# Patient Record
Sex: Female | Born: 1950 | Race: White | Hispanic: No | Smoking: Never smoker
Health system: Southern US, Community
[De-identification: ages and names within clinical notes are randomized; demographics above are authoritative.]

## PROBLEM LIST (undated history)

## (undated) DIAGNOSIS — G20 Parkinson's disease: Secondary | ICD-10-CM

## (undated) DIAGNOSIS — G4730 Sleep apnea, unspecified: Secondary | ICD-10-CM

## (undated) DIAGNOSIS — J42 Unspecified chronic bronchitis: Secondary | ICD-10-CM

## (undated) DIAGNOSIS — I82409 Acute embolism and thrombosis of unspecified deep veins of unspecified lower extremity: Secondary | ICD-10-CM

## (undated) DIAGNOSIS — M48061 Spinal stenosis, lumbar region without neurogenic claudication: Secondary | ICD-10-CM

## (undated) DIAGNOSIS — J189 Pneumonia, unspecified organism: Secondary | ICD-10-CM

## (undated) DIAGNOSIS — G20A1 Parkinson's disease without dyskinesia, without mention of fluctuations: Secondary | ICD-10-CM

## (undated) DIAGNOSIS — I4891 Unspecified atrial fibrillation: Secondary | ICD-10-CM

## (undated) DIAGNOSIS — F32A Depression, unspecified: Secondary | ICD-10-CM

## (undated) DIAGNOSIS — F419 Anxiety disorder, unspecified: Secondary | ICD-10-CM

## (undated) DIAGNOSIS — F329 Major depressive disorder, single episode, unspecified: Secondary | ICD-10-CM

## (undated) DIAGNOSIS — J45909 Unspecified asthma, uncomplicated: Secondary | ICD-10-CM

## (undated) HISTORY — PX: ABDOMINAL HYSTERECTOMY: SHX81

## (undated) HISTORY — PX: KNEE ARTHROSCOPY: SHX127

## (undated) HISTORY — PX: BACK SURGERY: SHX140

## (undated) HISTORY — PX: GENIOPLASTY: SUR643

## (undated) NOTE — ED Provider Notes (Signed)
 Formatting of this note is different from the original. EMERGENCY DEPARTMENT HISTORY AND PHYSICAL EXAM  Date: 09/03/2018 Patient Name: Sharon Jenkins  History of Presenting Illness   Chief Complaint  Patient presents with  ? Altered mental status   History Provided By: EMS  Additional History (Context):   3:19 AM  Sharon Jenkins is a 41 y.o. female with pertinent PMHx of Parkinson's disease presenting to the ED via EMS due to AMS x tonight. Pt lives at [facility] and her caretaker noticed that she was very stiff around 2:00 AM. She was given a dose of new medication (Apokyn) for her Parkinson's around 2:15 AM but her mental status deteriorated. Her caretakers noted that she had an episode of clear emesis with mucous earlier in the night. They also report that the pt is normally A&Ox3. Per EMS, on their arrival pt was not responding to commands and had a very stiff neck and jaw but would track them with her eyes. Her BGL was 125.  PCP: Other, Phys, MD  There are no other complaints, changes, or physical findings at this time.   Past History   Past Medical History: Past Medical History:  Diagnosis Date  ? A-fib (HCC)   ? Anxiety   ? Depression   ? Gait disorder   ? Kyphosis   ? Obstructive sleep apnea   ? Parkinson's disease (HCC)   ? Protein malnutrition (HCC)   ? REM behavioral disorder   ? Sialorrhea   ? Spinal stenosis   ? Tinnitus   ? Tremors of nervous system   ? Urinary incontinence    Past Surgical History: No past surgical history on file.  Family History: No family history on file.  Social History: Social History   Tobacco Use  ? Smoking status: Not on file  Substance Use Topics  ? Alcohol  use: Not on file  ? Drug use: Not on file   Allergies: No Known Allergies  Review of Systems  Review of Systems  Unable to perform ROS: Mental status change   Physical Exam   Vitals:   09/03/18 0323 09/03/18 0325 09/03/18 0400 09/03/18 0430  BP: 132/71   133/68 140/79  Pulse: 77  69 69  Resp: 17  22 23   Temp: 97.4 F (36.3 C)     SpO2: 100%  100% 100%  Weight:  64.7 kg (142 lb 11.2 oz)     Physical Exam Vitals signs and nursing note reviewed.  Constitutional:      Appearance: She is well-developed. She is not diaphoretic.     Comments: Near catatonic state. Non verbal. Mouth is held open.  HENT:     Head: Normocephalic and atraumatic.     Right Ear: External ear normal.     Left Ear: External ear normal.     Mouth/Throat:     Pharynx: No oropharyngeal exudate.  Eyes:     General: No scleral icterus.    Conjunctiva/sclera: Conjunctivae normal.     Pupils: Pupils are equal, round, and reactive to light.  Neck:     Musculoskeletal: Neck rigidity present.     Thyroid : No thyromegaly.     Vascular: No JVD.     Trachea: No tracheal deviation.  Cardiovascular:     Rate and Rhythm: Normal rate and regular rhythm.     Heart sounds: Normal heart sounds.  Pulmonary:     Effort: Pulmonary effort is normal. No respiratory distress.     Breath sounds: Normal  breath sounds. No stridor.  Abdominal:     General: Bowel sounds are normal. There is no distension.     Palpations: Abdomen is soft.     Tenderness: There is no abdominal tenderness. There is no guarding or rebound.  Musculoskeletal:        General: No tenderness.     Comments: Markedly slower muscle activity.  Lymphadenopathy:     Cervical: No cervical adenopathy.  Skin:    General: Skin is warm and dry.     Findings: No erythema or rash.  Neurological:     Cranial Nerves: No cranial nerve deficit.     Deep Tendon Reflexes: Reflexes are normal and symmetric.     Comments: Near catatonic state. Nonverbal.   Psychiatric:        Thought Content: Thought content normal.        Cognition and Memory: Cognition is impaired.        Judgment: Judgment normal.   Diagnostic Study Results   Labs -  Recent Results (from the past 12 hour(s))  CBC WITH AUTOMATED DIFF    Collection Time: 09/03/18  3:28 AM  Result Value Ref Range   WBC 11.1 4.6 - 13.2 K/uL   RBC 4.10 (L) 4.20 - 5.30 M/uL   HGB 12.5 12.0 - 16.0 g/dL   HCT 61.5 64.9 - 54.9 %   MCV 93.7 74.0 - 97.0 FL   MCH 30.5 24.0 - 34.0 PG   MCHC 32.6 31.0 - 37.0 g/dL   RDW 86.8 88.3 - 85.4 %   PLATELET 252 135 - 420 K/uL   MPV 9.2 9.2 - 11.8 FL   NEUTROPHILS 71 40 - 73 %   LYMPHOCYTES 17 (L) 21 - 52 %   MONOCYTES 7 3 - 10 %   EOSINOPHILS 5 0 - 5 %   BASOPHILS 0 0 - 2 %   ABS. NEUTROPHILS 8.0 1.8 - 8.0 K/UL   ABS. LYMPHOCYTES 1.8 0.9 - 3.6 K/UL   ABS. MONOCYTES 0.7 0.05 - 1.2 K/UL   ABS. EOSINOPHILS 0.6 (H) 0.0 - 0.4 K/UL   ABS. BASOPHILS 0.0 0.0 - 0.1 K/UL   DF AUTOMATED    METABOLIC PANEL, BASIC   Collection Time: 09/03/18  3:28 AM  Result Value Ref Range   Sodium 140 136 - 145 mmol/L   Potassium 3.8 3.5 - 5.5 mmol/L   Chloride 106 100 - 111 mmol/L   CO2 30 21 - 32 mmol/L   Anion gap 4 3.0 - 18 mmol/L   Glucose 102 (H) 74 - 99 mg/dL   BUN 17 7.0 - 18 MG/DL   Creatinine 9.18 0.6 - 1.3 MG/DL   BUN/Creatinine ratio 21 (H) 12 - 20     GFR est AA >60 >60 ml/min/1.2m2   GFR est non-AA >60 >60 ml/min/1.61m2   Calcium  9.5 8.5 - 10.1 MG/DL  MAGNESIUM   Collection Time: 09/03/18  3:28 AM  Result Value Ref Range   Magnesium 2.3 1.6 - 2.6 mg/dL  CK   Collection Time: 09/03/18  3:28 AM  Result Value Ref Range   CK 119 26 - 192 U/L   Radiologic Studies -  No orders to display   CT Results  (Last 48 hours)   None    CXR Results  (Last 48 hours)   None    Medical Decision Making  I am the first provider for this patient.  I reviewed the vital signs, available nursing notes, past medical history, past  surgical history, family history and social history.  Vital Signs-Reviewed the patient's vital signs.  Pulse Oximetry Analysis - 100% on RA   Cardiac Monitor: Rate: 70 bpm Rhythm: NSR  Records Reviewed: Nursing Notes and Old Medical Records  Provider Notes (Medical Decision  Making): Looks to be medication side effect. Spoke with neurologist, no interventions required. Will let medications wear off.  PROCEDURES: Procedures  MEDICATIONS GIVEN IN THE ED: Medications - No data to display   ED COURSE:  3:19 AM  Initial assessment performed.  CONSULT NOTE:  4:04 AM Reyes BIRCH. Eliza, MD spoke with Roel Purl, MD  Specialty: Neurology Discussed pt's hx, disposition, and available diagnostic and imaging results  over the telephone. Reviewed care plans. Consulting physician agrees with plans as outlined.   Diagnosis and Disposition   DISCHARGE NOTE: 4:56 AM The patient is ready for discharge. The patient's signs, symptoms, diagnosis, and discharge instructions have been discussed and the patient and/or family has conveyed their understanding. The patient and/or family is to follow up as recommended or return to the ER should their symptoms worsen. Plan has been discussed and the patient and/or family is in agreement.  Written by Chiquita Canary, ED Scribe, as dictated by Reyes CORDOBA Eliza, MD.   CLINICAL IMPRESSION: 1. Catatonia   2. Parkinson's disease (HCC)    PLAN: 1. D/C Home 2.  Current Discharge Medication List    3.  Follow-up Information   None   _______________________________  Attestations: This note is prepared by Chiquita Canary, acting as Scribe for [employer]. Eliza, MD.  Reyes BIRCH. Eliza, MD:  The scribe's documentation has been prepared under my direction and personally reviewed by me in its entirety. I confirm that the note above accurately reflects all work, treatment, procedures, and medical decision making performed by me. _______________________________    Electronically signed by Eliza Reyes BIRCH, MD at 09/13/2018  9:26 PM EDT

## (undated) NOTE — ED Notes (Signed)
 Formatting of this note might be different from the original. Pt was transferred back to home facility [facility]. She is stable, moving on her own and conversing and in NAD. Transported via stretcher with [facility] transport with medics at her side. She is in NAD and [facility] is expecting her arrival .  Electronically signed by Celestino Wynema HERO, RN at 09/03/2018  7:22 AM EDT

## (undated) NOTE — ED Triage Notes (Signed)
 Formatting of this note might be different from the original. Per updated report of staff pt began to experience increased stiffness at 2 am this morning. She was responsive, but unable to follow commands at hat time. She was given Apoxyn at that time PRN to help her muscles relax. However, the med was not effective as it usually is and pt remained stiff, unable to speak and there for was transferred to the ED for evaluation. Pt did have an episode of clear mucoid emesis last night at [facility].  Electronically signed by Celestino Wynema HERO, RN at 09/03/2018  3:37 AM EDT

## (undated) NOTE — ED Notes (Signed)
 Formatting of this note might be different from the original. BIBA from facility. Pt with hx of parkinson's disease. Received a new medication tonight.Now stiff and with limited movement on her own or speak. Pt does track with her eyes.  Unable to complete full screens as pt is unable to answer.  Electronically signed by Celestino Wynema HERO, RN at 09/03/2018  3:44 AM EDT

## (undated) NOTE — ED Notes (Signed)
 Formatting of this note might be different from the original. Warm blankets given for comfort. Pt opens eyes with speech, able to speak with RN. Voice is quiet, but coherent. Pt is OX4, answers that she is feeling better.  Electronically signed by Celestino Wynema HERO, RN at 09/03/2018  4:38 AM EDT

---

## 1972-06-17 HISTORY — PX: DILATION AND CURETTAGE OF UTERUS: SHX78

## 1978-05-17 HISTORY — PX: TUBAL LIGATION: SHX77

## 1985-06-17 HISTORY — PX: VARICOSE VEIN SURGERY: SHX832

## 1989-06-17 HISTORY — PX: LUMBAR DISC ARTHROPLASTY: SHX699

## 1996-06-17 HISTORY — PX: CATARACT EXTRACTION W/ INTRAOCULAR LENS  IMPLANT, BILATERAL: SHX1307

## 2000-03-15 ENCOUNTER — Emergency Department (HOSPITAL_COMMUNITY): Admission: EM | Admit: 2000-03-15 | Discharge: 2000-03-15 | Payer: Self-pay | Admitting: Family Medicine
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
## 2003-05-11 NOTE — Procedures (Signed)
[facility]                         PERIPHERAL VASCULAR LABORATORY   NAME:     Lindsay [name], Lindsay [name]                    DATE:   05/11/2003   AGE/DOB: 52  /  07/31/1950                      ROOM #: OP   SEX:      F                                 MR #:    [id number]   CPT CODE: 93975                            SS#     [id number]   REFERRING PHYSICIAN:   FELIX P. TIONGCO, M.D.   CHIEF COMPLAINT/SYMPTOMS:  Ischemic colitis   EXAMINATION:  SMA/CELIAC ARTERY EXAMINATION   INTERPRETATION:   Fasting and postprandial superior mesenteric artery and   celiac artery velocities are within normal limits.   IMPRESSION:   No evidence of superior mesenteric or celiac artery stenosis.   Electronically Signed By:   RASESH M. SHAH, M.D. 05/17/2003 08:41   ______________________________________________   RASESH M. SHAH, M.D.   lo  D: 05/11/2003  T: 05/11/2003  7:57 P  [id number]

## 2003-05-11 NOTE — Procedures (Signed)
[facility]                         PERIPHERAL VASCULAR LABORATORY   NAME:     Fiumara, Chivon H                    DATE:   05/11/2003   AGE/DOB: 52  /  [birth year]                      ROOM #: OP   SEX:      F                                 MR #:    [id number]   CPT CODE: [id number]                            SS#     [id number]   REFERRING PHYSICIAN:   Robby Sermon, M.D.   CHIEF COMPLAINT/SYMPTOMS:  Ischemic colitis   EXAMINATION:  SMA/CELIAC ARTERY EXAMINATION   INTERPRETATION:   Fasting and postprandial superior mesenteric artery and   celiac artery velocities are within normal limits.   IMPRESSION:   No evidence of superior mesenteric or celiac artery stenosis.   Electronically Signed By:   Michael Litter, M.D. 05/17/2003 08:41   ______________________________________________   Michael Litter, M.D.   lo  D: 05/11/2003  T: 05/11/2003  7:57 P  [id number]

## 2008-06-07 LAB — CBC WITH AUTOMATED DIFF
ABS. EOSINOPHILS: 0.3 10*3/uL (ref 0.0–0.4)
ABS. LYMPHOCYTES: 2.4 10*3/uL (ref 0.8–3.5)
ABS. MONOCYTES: 0.7 10*3/uL (ref 0–1.0)
ABS. NEUTROPHILS: 2.4 10*3/uL (ref 1.8–8.0)
BASOPHILS: 1 % (ref 0–3)
EOSINOPHILS: 5 % (ref 0–5)
HCT: 36.4 % (ref 36.0–46.0)
HGB: 12.5 g/dL (ref 12.0–16.0)
LYMPHOCYTES: 41 % (ref 20–51)
MCH: 31.8 PG (ref 25.0–35.0)
MCHC: 34.4 g/dL (ref 31.0–37.0)
MCV: 92.5 FL (ref 78.0–102.0)
MONOCYTES: 12 % — ABNORMAL HIGH (ref 2–9)
MPV: 7.9 FL (ref 7.4–10.4)
NEUTROPHILS: 41 % — ABNORMAL LOW (ref 42–75)
PLATELET: 239 10*3/uL (ref 130–400)
RBC: 3.94 M/uL — ABNORMAL LOW (ref 4.10–5.10)
RDW: 13.4 % (ref 11.5–14.5)
WBC: 5.8 10*3/uL (ref 4.5–13.0)

## 2008-06-10 LAB — PROTEIN ELECTROPHORESIS
Albumin: 3.78 g/dL (ref 3.75–5.01)
Alpha-1: 0.3 g/dL (ref 0.19–0.46)
Alpha-2: 0.78 g/dL (ref 0.48–1.05)
Beta: 0.94 g/dL (ref 0.48–1.10)
Gamma: 1.01 g/dL (ref 0.62–1.51)
Total protein: 6.8 g/dL (ref 6.00–8.30)
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
## 2010-05-08 NOTE — Procedures (Signed)
Study ID: [id number]                                                      [facility]                                                      [address]                                                       [place], [place] [address]                            Exercise Stress Echocardiogram Report           Name: [name], [name]      Study Date: 05/08/2010 01:37 PM   MRN: [id number]               Patient Location: [facility]OUTPT[facility]   DOB: 11/28/1950           Age: 59 yrs   Height: 67 in             Weight: 137 lb                      BSA: 1.7 meters2   BP: 129/75 mmHg           HR: 59   Gender: Female            Account #: [id number]   Reason For Study: Chest Pain   Ordering Physician: CANNON, MEGAN   Performed By: Williams, Richard W., RDCS       Interpretation Summary   The Electrocardiographic Interpretation :Borderline postive by ECG criteria..   The Echocardiographic Interpretation: normal wall motion.   The stress ECG was borderline postive with 1-2 mm upsloping ST depression. The   ECHO images were negative for ischemia suggestiing a false postive ECG portion   of the test.       Stress Results              Protocol:  Bruce              Target HR: 137 bpm         Maximum Predicted HR: 161 bpm                          Stress Duration:   13:01 mm:ss *                      Maximum Stress HR: 157 bpm *           Stress Comments   A treadmill exercise test according to Bruce protocol was performed. Resting   ECG: Sinus Bradycardia; within normal limits. ST changes with exercise: 1-2 mm   depression rapid upsloping inferolateral. Arrhythmias: atrial premature beats,   ventricular premature beats. Test was terminated due to target heart   rate was   achieved. Normal HR and BP responses to exercise. No chest pain.           I      WMSI = 1.00     % Normal = 100                                                                   Rest                                                                    II      WMSI = 1.00     % Normal = 100                                                                   Peak                                                                                                                               Segments  Size   X - Cannot   1 - Normal   2 -          3 - Akinetic4 -          1-2     small   Interpret                 Hypokinetic              Dyskinetic   3-5     moder   ate   5 -                                                             6-14    large   Aneurysmal                                                        15-16   diffu   se               Left Ventricle   The left ventricular chamber size at rest is normal. The left ventricular   chamber size at peak stress is smaller.       _____________________________________________________________________________   __           Electronically signed byDr Robert D McCray, MD   05/08/2010 03:57 PM

## 2010-05-08 NOTE — Procedures (Signed)
Study ID: [id number]                                                      [facility]                                                      [address] [place], [place] [address]                            Exercise Stress Echocardiogram Report           Name: KYMBER, KOSAR      Study Date: 05/08/2010 01:37 PM   MRN: [id number]               Patient Location: [facility]OUT[facility]   DOB: 1950/06/27           Age: 59 yrs   Height: 67 in             Weight: 137 lb                      BSA: 1.7 meters2   BP: 129/75 mmHg           HR: 59   Gender: Female            Account #: [id number]   Reason For Study: Chest Pain   Ordering Physician: Jone Baseman   Performed By: Kerrin Champagne., RDCS       Interpretation Summary   The Electrocardiographic Interpretation :Borderline postive by ECG criteria..   The Echocardiographic Interpretation: normal wall motion.   The stress ECG was borderline postive with 1-2 mm upsloping ST depression. The   ECHO images were negative for ischemia suggestiing a false postive ECG portion   of the test.       Stress Results              Protocol:  Bruce              Target HR: 137 bpm         Maximum Predicted HR: 161 bpm                          Stress Duration:   13:01 mm:ss *                      Maximum Stress HR: 157 bpm *           Stress Comments   A treadmill exercise test according to Bruce protocol was performed. Resting   ECG: Sinus Bradycardia; within normal limits. ST changes with exercise: 1-2 mm   depression rapid upsloping inferolateral. Arrhythmias: atrial premature beats,   ventricular premature beats. Test was terminated due to target heart  rate was   achieved. Normal HR and BP responses to exercise. No chest pain.           I      WMSI = 1.00     % Normal = 100                                                                   Rest                                                                    II      WMSI = 1.00     % Normal = 100                                                                   Peak                                                                                                                               Segments  Size   X - Cannot   1 - Normal   2 -          3 - Akinetic4 -          1-2     small   Interpret                 Hypokinetic              Dyskinetic   3-5     moder   ate   5 -                                                             6-14    large   Aneurysmal  15-16   diffu   se               Left Ventricle   The left ventricular chamber size at rest is normal. The left ventricular   chamber size at peak stress is smaller.       _____________________________________________________________________________   __           Electronically signed byDr Burgess Estelle, MD   05/08/2010 03:57 PM

## 2011-05-03 NOTE — Procedures (Signed)
Test Reason : Chest pain   Blood Pressure : ***/*** mmHG   Vent. Rate : 083 BPM     Atrial Rate : 083 BPM      P-R Int : 116 ms          QRS Dur : 078 ms       QT Int : 380 ms       P-R-T Axes : 045 -04 062 degrees      QTc Int : 446 ms   Normal sinus rhythm   Septal infarct , age undetermined   Abnormal ECG   No previous ECGs available   Confirmed by Alimard, M.D., Ramin ([id number]) on 05/03/2011 9:03:38 PM   Referred By:             Overread By: Ramin Alimard, M.D.

## 2011-05-03 NOTE — ED Provider Notes (Signed)
MEDICATION ADMINISTRATION SUMMARY              Drug Name: Aspirin, Dose Ordered: 325 mg, Route: Oral, Status: Given,         Time: 06:41 05/03/2011,          Drug Name: *Sodium Chloride 0.9%, Intravenous, Dose Ordered: 500 mL,         Route: IV Fluid, Status: Given, Time: 06:27 05/03/2011,          Drug Name: Zofran, Dose Ordered: 4 mg, Route: IV Push, Status: Held,         Time: 06:24 05/03/2011,          Drug Name: Morphine Sulfate, Dose Ordered: 4 mg, Route: IV Push,         Status: Held, Time: 06:24 05/03/2011, *Additional information         available in notes, Detailed record available in Medication Service         section.       KNOWN ALLERGIES   NKDA: Source: Patient       Lindsay [name] May 03, 2011 06:00 MWW1)   PATIENT: NAME: [name], [name], AGE: 60, GENDER: female, DOB: [birth year]         [birth year], TIME OF GREET: Fri May 03, 2011 05:58, SSN: [id number],         MEDICAL RECORD NUMBER: [id number], ACCOUNT NUMBER: [id number], PCP:         Marquita Palms,. Caleen [name] May 03, 2011 06:00 MWW1)   ADMISSION: URGENCY: 2, TRANSPORT: Wheelchair, DEPT: Emergency,         BED: 2ED 30. Caleen [name] May 03, 2011 06:00 MWW1)   COMPLAINT:  Chest Pain. Caleen [name] May 03, 2011 06:00 MWW1)   PRESENTING COMPLAINT:  midsternal chest pain radiating to back         with SOB. (06:23 PTS0)   PAIN: Patient complains of pain, On a scale 0-10 patient rates         pain as 7, pressure, Pain is constant, Onset was 0000. (06:23         PTS0)   LMP: LMP: Hysterectomy. (06:23 PTS0)   TB SCREENING: TB screen negative for this patient. (06:23         PTS0)   ABUSE SCREENING: Patient denies physical abuse or threats. (06:23         PTS0)   FALL RISK: Patient has a low risk of falling, Patient has no         history of falling (0), No secondary diagnosis (0), None/bed         rest/nurse assist (0), IV or IV Access (20), Normal/bed         rest/wheelchair (0), Oriented to own ability (0), Total 20. (06:23         PTS0)    SUICIDAL IDEATION: Suicidal ideation is not present. (06:23         PTS0)   ADVANCE DIRECTIVES: Patient does not have advance directives.         (06:23 PTS0)   PROVIDERS: TRIAGE NURSE: Lindsay Sauer, RN. Caleen [name] May 03, 2011         06:00 MWW1)       PRESENTING PROBLEM Caleen [name] May 03, 2011 06:00 MWW1)      Presenting problems: Chest Pain - Adult.       CURRENT MEDICATIONS   Carbidopa-Levodopa:  25/100 mg Oral 3 times a day. (06:11         PTS0)  Requip XL:  8 mg Oral once a day. (06:12 PTS0)   Azilect:  1 mg Oral once a day. (06:12 PTS0)   Trihexyphenidyl Hydrochloride:  2 mg Oral 3 times a day. (06:13         PTS0)   Centrum Silver Women's:  1 tab(s) Oral once a day. (06:13         PTS0)   Calcium 600+D:  1 tab(s) Oral once a day. (06:13 PTS0)   Omega-3:  2 tab(s) Oral once a day. (06:14 PTS0)       MEDICATION SERVICE   Aspirin:  Order: Aspirin - Dose: 325 mg : Oral         Ordered by: Marlana Salvage, PA-C         Entered by: Marlana Salvage, PA-C Fri May 03, 2011 06:36 ,          Acknowledged by: Odessa Fleming, RN [facility] May 03, 2011 06:37         Documented as given by: Odessa Fleming, RN [facility] May 03, 2011 06:41          Patient, Medication, Dose, Route and Time verified prior to         administration.          Amount given: 325mg , Site: Medication administered P.O., Correct         patient, time, route, dose and medication confirmed prior to         administration, Patient advised of actions and side-effects prior to         administration, Allergies confirmed and medications reviewed prior to         administration, Administered by PSmith,RN, Patient in position of         comfort, Side rails up, Cart in lowest position, Family at bedside,         Call light in reach.   Morphine Sulfate:  Order: Morphine Sulfate - Dose: 4 mg         : IV Push         Ordered by: Marlana Salvage, PA-C         Entered by: Marlana Salvage, PA-C Fri May 03, 2011 06:15 ,          Acknowledged by: Odessa Fleming, RN [facility] May 03, 2011 06:23,           Held by: Odessa Fleming, RN Caleen [name] May 03, 2011 06:24 Reason: Pain         controlled at present.   Sodium Chloride 0.9%, Intravenous:  Order: Sodium Chloride 0.9%,         Intravenous (Sodium Chloride) - Dose: 500 mL : IV Fluid         Notes: Bolus         *Reminder* Enter reason for fluid below         For Hypotension         Ordered by: Marlana Salvage, PA-C         Entered by: Marlana Salvage, PA-C Fri May 03, 2011 06:15 ,          Acknowledged by: Odessa Fleming, RN [facility] May 03, 2011 06:23         Documented as given by: Odessa Fleming, RN [facility] May 03, 2011 06:27          Patient, Medication, Dose, Route and Time verified prior to         administration.  Amount given: , IV SITE #1 into left antecubital, IV SITE #1 IV         fluids established, IV SITE #1 1st bag hung, amount , IV SITE #1         bolus of 500 ml established, IV SITE #1 Rate of bolus, wide open, via         primary tubing, Correct patient, time, route, dose and medication         confirmed prior to administration, Patient advised of actions and         side-effects prior to administration, Allergies confirmed and         medications reviewed prior to administration, Administered by         PSmith,RN, Patient in position of comfort, Side rails up, Cart in         lowest position, Family at bedside, Call light in reach.    : Follow Up : _IV SITE #1:_, IV fluid infusion discontinued, on         Fri May 03, 2011 07:01, 35 minutes, ., Total amount infused: ,         IV Line flushed after administration. (07:01 PTS0)   Zofran:  Order: Zofran (Ondansetron Hydrochloride) -         Dose: 4 mg : IV Push         Ordered by: Marlana Salvage, PA-C         Entered by: Marlana Salvage, PA-C Fri May 03, 2011 06:15 ,          Acknowledged by: Odessa Fleming, RN [facility] May 03, 2011 06:23,          Held by: Odessa Fleming, RN Caleen [name] May 03, 2011 06:24 Reason: Symptoms         controlled at present.       ORDERS   O2 2L NC:  Ordered for: Carmela Hurt, MD, Ben          Status: Done by Katrinka Blazing, RN, Marguerita Beards May 03, 2011 06:16. (06:05         MAA2)   CPK PROFILE:  Ordered for: Carmela Hurt, MD, Ben         Status: Done by System Fri May 03, 2011 06:51. (06:05 MAA2)   Monitor strip:  Ordered for: Carmela Hurt, MD, Ben         Status: Done by Katrinka Blazing, RN, Marguerita Beards May 03, 2011 06:16. (06:05         MAA2)   12 LEAD EKG:  Ordered for: Carmela Hurt, MD, Ben         Status: Active. (06:05 MAA2)   MYOGLOBIN (BLOOD):  Ordered for: Carmela Hurt, MD, Ben         Status: Done by System Fri May 03, 2011 06:51. (06:05 MAA2)   CBC, AUTOMATED DIFFERENTIAL:  Ordered for: Carmela Hurt, MD, Ben         Status: Done by System Fri May 03, 2011 06:34. (06:05 MAA2)   Cardiac Monitor:  Ordered for: Carmela Hurt, MD, Ben         Status: Done by Katrinka Blazing, RN, Marguerita Beards May 03, 2011 06:16. (06:05         MAA2)   O2 sat Monitor:  Ordered for: Carmela Hurt, MD, Ben         Status: Done by Katrinka Blazing, RN, Marguerita Beards May 03, 2011 06:16. (06:05         MAA2)   TROPONIN I:  Ordered for: Carmela Hurt, MD, Romeo Apple  Status: Done by System Fri May 03, 2011 06:51. (06:05 MAA2)   BASIC METABOLIC PANEL:  Ordered for: Carmela Hurt, MD, Ben         Status: Done by System Fri May 03, 2011 06:49. (06:05 MAA2)   BP Monitor:  Ordered for: Carmela Hurt, MD, Ben         Status: Done by Katrinka Blazing, RN, Marguerita Beards May 03, 2011 06:16. (06:05         MAA2)   IV- Saline Lock:  Ordered for: Carmela Hurt, MD, Ben         Status: Done by Katrinka Blazing, RN, Marguerita Beards May 03, 2011 06:16. (06:05         MAA2)   D-DIMER:  Ordered for: Carmela Hurt, MD, Ben         Status: Done by System Fri May 03, 2011 06:42. (06:14 EI)   BP Cuff Adult Regular:  Ordered for: Carmela Hurt, MD, Ben         Status: Active. (06:16 PTS0)   MONITOR ELECTRODE:  Ordered for: Carmela Hurt, MD, Ben         Status: Active. (06:16 PTS0)   CONTINUOUS PULSE OX:  Ordered for: Carmela Hurt, MD, Ben         Status: Active. (06:16 PTS0)   Elita Boone IV Cath:  Ordered for: Carmela Hurt, MD, Ben          Status: Active. (06:16 PTS0)   IV Start kit:  Ordered forCarmela Hurt, MD, Ben         Status: Active. (06:16 PTS0)   Pillow Disposable:  Ordered for: Carmela Hurt, MD, Ben         Status: Active. (06:16 PTS0)   CHEST 2 VIEWS:  Ordered forCarmela Hurt, MD, Ben         Status: Active. (06:45 EI)   CTA Chest:  Ordered for: Carmela Hurt, MD, Ben         Status: Active         Comment: Suspect pulmonary embolism. (07:03 EI)   IV Set - Primary Tubing:  Ordered for: Carmela Hurt, MD, Ben         Status: Active. (09:06 MNL1)       NURSING ASSESSMENT: CV WITH PROCEDURES (06:17 PTS0)   CONSTITUTIONAL: Complex assessment performed, History obtained         from patient, Patient arrives, via hospital wheelchair, Gait steady,         Patient appears, in distress due to pain, Patient cooperative,         Patient alert, Oriented to person, place and time, Skin warm, Skin         dry, Skin normal in color, Mucous membranes pink, Mucous membranes         moist, Patient is well-groomed.   PAIN: pressure pain, midsternal, Pain radiates, to the back,         Onset of pain 0000, constant, on a scale 0-10 patient rates pain as         7.   CARDIOVASCULAR: Cardiovascular assessment findings include heart         rate normal, Heart rhythm normal sinus, Associated with dyspnea, with         exertion, with position change, Associated with dizziness, described         as room spinning.   RESPIRATORY/CHEST: Respiratory assessment findings include         respiratory effort, shallow, Converses, in short phrases, Signs of         distress, in  mild distress, Breath sounds diminished, no associated         cough noted.   IV: IV therapy indicated for medication administration, IV         established, to the left antecubital, using a 20 gauge catheter, in         one attempt, Saline lock established, Flushed with normal saline         (mls): 10, Labs drawn at time of placement, Specimens labeled in the          presence of the patient, Specimen(s) sent to lab, Tourniquet removed         from patient after procedure.   CARDIAC MONITOR: Cardiac monitoring indicated for complaint of         chest pain, Patient placed on cardiac monitor, Heart rate: 83,         showing normal sinus rhythm, Patient placed on non-invasive blood         pressure monitor, with disposable blood pressure cuff applied,         Patient placed on continuous pulse oximetry, Adult/pediatric         oxisensor applied, Oxygen saturation 98%, on room air.   SAFETY: Side rails up, Cart/Stretcher in lowest position, Family         at bedside, Call light within reach, Hospital ID band on.   VITAL SIGNS: BP: 99, / 64, BP: (Sitting), Pulse: 82, Resp: 15,         Pain: 7, O2 sat: 98%, on Room air.       NURSING PROCEDURE: DISCHARGE NOTE (09:05 MNL1)   DISCHARGE: Patient discharged to home, ambulating without         assistance, family driving, accompanied by husband/wife/partner,         Discharge instructions given to patient, IV discontinued at 0855,         Simple or moderate discharge teaching performed, Prescriptions given         and instructions on side effects given, Name of prescription(s)         given: Vicodin, Prevacid, Above person(s) verbalized understanding of         discharge instructions and follow-up care.       NURSING PROCEDURE: EKG CHART (06:00 AVM0)   PATIENT IDENTIFIER: Patient's identity verified by patient         stating name, Patient's identity verified by patient stating birth         date.   EKG: EKG indicated for complaint of chest pain, 12 lead EKG         performed on the left chest, done by A MINNICK, first EKG.   FOLLOW-UP: After procedure, EKG for interpretation given to Dr.         Carmela Hurt, EKG was given to Dr. at 1610.       NURSING PROCEDURE: NURSE NOTES   NURSES NOTES: Shift change report given, to Lantry,RN, Provided         opportunity to answer questions. (07:08 PTS0)      Patient in no apparent distress, Patient states decreased pain, Patient         resting quietly, Assistance offered to patient, Report received, from         [facility], for shift change. (07:22 MNL1)       NURSING PROCEDURE: TRANSPORT TO TESTS   PATIENT IDENTIFIER: Patient's identity verified by patient         stating name, Patient's identity verified  by patient stating birth         date. (06:52 PTS0)   TRANSPORT TO TESTS: Transport indicated to facilitate diagnosis,         Patient transported to x-ray, via cart, Accompanied by x-ray         technician. (06:52 PTS0)     Patient transported to CT scan, via cart, Accompanied by transport         technician. (07:40 MNL1)       DIAGNOSIS (08:36 BAF)   FINAL: PRIMARY: Costochondritis, ADDITIONAL: Esophageal         stricture.       DISPOSITION   PATIENT:  Disposition Type: Discharged, Disposition: Discharged,         Condition: Stable. (08:36 BAF)      Patient left the department. (09:07 MNL1)       INSTRUCTION (08:38 BAF)   DISCHARGE:  COSTOCHONDRITIS (CHEST WALL PAIN).   FOLLOWUPEarlie Raveling [address]         [id number], [place] [address], [phone], Mendel Corning, FAMILY         PRACTICE, [address], [place] [address], [phone].   SPECIAL:  Use Aleve and Tylenol as needed for chest pain. Use the         Vicodin as needed for more significant pain. Take the Prevacid daily.         Avoid foods that cause you difficutly. Follow-up with Dr. Dorothyann Gibbs         asap. Return for increased/changing pain, shortness of breath, or         other concerns.   Key:     AVM0=Minnick, RN, Amy  BAF=Fickenscher, MD, Stann Ore, PA-C, Erin     MAA2=Alcos, RN, MSN, [employer]  MNL1=Lantry, RN, CEN, Elon Jester  MWW1=Watson,     RN, Marcelino Duster     PTS0=Smith, RN, Boyd Kerbs

## 2011-05-03 NOTE — Procedures (Signed)
Test Reason : Chest pain   Blood Pressure : ***/*** mmHG   Vent. Rate : 118 BPM     Atrial Rate : 441 BPM      P-R Int : 000 ms          QRS Dur : 078 ms       QT Int : 342 ms       P-R-T Axes : 000 047 029 degrees      QTc Int : 479 ms   Atrial fibrillation with rapid ventricular response   Septal infarct (cited on or before 03-May-2011)   Abnormal ECG   When compared with ECG of 03-May-2011 05:59,   Atrial fibrillation has replaced Sinus rhythm   Confirmed by Sposato, Joseph ([id number]) on 05/04/2011 4:49:14 PM   Referred By:             Overread By: Joseph Sposato

## 2011-05-03 NOTE — Procedures (Signed)
Test Reason : Chest pain   Blood Pressure : ***/*** mmHG   Vent. Rate : 078 BPM     Atrial Rate : 078 BPM      P-R Int : 122 ms          QRS Dur : 086 ms       QT Int : 378 ms       P-R-T Axes : 027 037 054 degrees      QTc Int : 430 ms   Normal sinus rhythm   Septal infarct , age undetermined   Abnormal ECG   When compared with ECG of 03 May 2011 05:59:12 No significant change was found   Confirmed by Sposato, Joseph ([id number]) on 05/04/2011 4:52:45 PM   Referred By:             Overread By: Joseph Sposato

## 2011-05-03 NOTE — ED Provider Notes (Signed)
[facility]   EMERGENCY DEPARTMENT TREATMENT REPORT   NAME:  Lindsay [name], Lindsay [name]   SEX:   F   ADMIT: 05/03/2011   DOB:   [birth year]   MR#    [id number]   ROOM:     TIME SEEN: 07 31 PM   ACCT#  [id number]       cc: Wendall Stade MD, Sedonia Small MD, Perry Mount MD       FAMILY DOCTOR:   Dr. Kyung Rudd.       GI DOCTOR:   Dr. Lavone Neri.       TIME OF EVALUATION:   1848.       CHIEF COMPLAINT:   Chest pain.       HISTORY OF PRESENT ILLNESS:   This is a 60 year old female who presents complaining of a 12 out of 10 tight    intermittent sternal chest pain that has been going on for several weeks.  It    began after she exercised.  She states that it feels as though a cinderblock    is on the middle of her chest.  It radiates to her back, her neck and down    her right leg at times.  It is worse with lying down.  She states that    yesterday she ran 6 miles and had no problems.  She states that she has had    intermittent shortness of breath but no cough.  She had a stress test about 1    year ago that was unremarkable.  The patient was seen in our facility earlier    today.  She had a negative chest x-ray, cardiac enzymes were negative times    1, D-dimer was elevated at 0.71.  She had a CTA of the chest done that showed    no pulmonary embolism.  She was discharged home.  She states she was unable    to get in with her PCP so she went and saw her GI doctor, Dr. Lavone Neri.  When    she was there she states that they were concerned about her chest pain and    also that the patient has been having sternal pain with swallowing and    sometimes feels as though there is food caught in her esophagus.  They    referred her back to our facility for further evaluation.  The patient states    she was discharged home with prednisone and Vicodin but has had no relief of    her pain throughout the day.       REVIEW OF SYSTEMS:   CONSTITUTIONAL:  No fever, no chills.   ENT:  No sore throat.   HEMATOLOGIC:  No bruising.    RESPIRATORY:  Positive for shortness of breath, no cough.   CARDIOVASCULAR:  Positive for chest pain.   GASTROINTESTINAL:  No abdominal pain, no nausea, vomiting or diarrhea.   GENITOURINARY:  No dysuria.   MUSCULOSKELETAL:  No calf pain or swelling.   INTEGUMENTARY:  No rashes.   NEUROLOGICAL:  No headaches.       PAST MEDICAL HISTORY:   Parkinson's, hysterectomy, spinal surgery, tubal ligation.       MEDICATIONS:     Multiple and reviewed in [place].       ALLERGIES:   NONE.       SOCIAL HISTORY:   Nonsmoker.       FAMILY HISTORY:   Unknown.       PHYSICAL EXAMINATION:  VITAL SIGNS:  Blood pressure 106/60, pulse 85, respiratory rate 18,    temperature 98.9, O2 saturation is 100% on room air, pain is a 4 out of 10.   GENERAL APPEARANCE:  This is a pleasant 59 year old female, nontoxic, alert    and oriented, no acute distress.   HEENT:  Eyes:  Conjunctivae clear.  Lids are normal.  Mouth/Throat:  Surfaces    of the pharynx, palate, and tongue are pink, moist, and without lesions.  No    posterior pharyngeal swelling, exudates or lesions.   RESPIRATORY:  Clear breath sounds bilaterally.  As she takes a deep breath she    states that this increases her chest pain.  No respiratory distress.  No    wheezes, rales or rhonchi.   CARDIOVASCULAR:  Heart regular rate and rhythm, no murmurs, rubs or gallops.   CHEST:  Symmetric.  Tenderness along the sternum that is reproducible on exam.     No overlying skin lesions, chest wall masses or scars.   VASCULAR:  Calves are soft and nontender.  No peripheral edema.   GASTROINTESTINAL:  The abdomen is soft and nontender, no distension, no    complaint of pain to palpation.  No organomegaly.   SKIN:  Warm and dry without rashes.    Judgment appears appropriate.       CONTINUATION BY SARA TSUCHITANI, MD:   EKG on arrival sinus rhythm, rate of 78, PR 122, QRS 86, QTC 430, no acute    ischemic change.  Repeat EKG at 2059 per nursing noted rhythm change, atrial     fibrillation, rate of 118, QRS 78, QTc 479, no STEMI  CT from this morning    reviewed.  It was negative.  Thyroid studies, magnesium normal.  Cardiac    enzymes negative.  BMP, CBC are unremarkable.         ER COURSE AND MEDICAL DECISION MAKING:   The patient here after having been evaluated this morning and she got a CT of    her chest and then she reportedly went to see GI and they recommended further    evaluation with possible endoscopy per her report and also possibly a repeat    stress test.  The patient says that she has had one within the last year and    she states that her pain is not exacerbated by exertion and she often runs    many miles per day and does not have her symptoms of chest pain.  Her lungs    are clear.  Her heart was regular on arrival.  Her sats are normal; therefore,    I do not think she needs a repeat chest x-ray.  She just came back, not    because she was worse, but because she was told she would likely need further    evaluation by GI.  The patient had been monitored and we were going to get    ready to place her in ED observation for repeat stress test when nursing noted    a rhythm change.  Repeated an EKG and she was in atrial fibrillation.  The    patient when I checked on her still had normal sats.  Her blood pressure was    fine.  She was not feeling any palpitations.  She was not feeling short of    breath and we gave her a liter bolus.  Pulse is still ranging from 119 to 132    when  I watched her on the monitor after the liter bolus.  Therefore, I    ordered some Cardizem.  She got the 10 mg IV Cardizem and her pulse improved    to the 80s to 90s, still irregular in atrial fibrillation, blood pressure    right around 100 systolic.  She is continuing to get fluids.  She got aspirin    this morning at 6:30 so I did not repeat it and she declined anything    stronger than Tylenol for pain.  I checked on her multiple times.  She did not     develop any new complaints.  She was up and ambulatory to the bathroom.  She    was discussed with Dr. Sedonia Small, her PCP, who has accepted her to    observation telemetry for continued evaluation, new onset atrial fibrillation.     She has not started the Cardizem drip because her systolic is right around    100 and she was rate controlled after the bolus.  So, nursing has held that at    this time.  The patient was updated on findings.  Her questions were    answered.       DIAGNOSES:   1. New onset atrial fibrillation.     2. Evaluation of chest pain.           ___________________   Tana Conch MD   Dictated By: Sheppard Penton, PA-C       My signature above authenticates this document and my orders, the final    diagnosis (es), discharge prescription (s), and instructions in the [facility]    Pulsecheck record.   CL   D:05/03/2011   T: 05/03/2011 20:10:13   [id number]

## 2011-05-03 NOTE — ED Provider Notes (Signed)
[facility]   EMERGENCY DEPARTMENT TREATMENT REPORT   NAME:  Lindsay [name], Lindsay [name]   SEX:   F   ADMIT: 05/03/2011   DOB:   16-Dec-1950   MR#    [id number]   ROOM:     TIME SEEN: 07 00 AM   ACCT#  [id number]       cc: Rosana Berger MD       PRIMARY CARE PHYSICIAN:   Rosana Berger, MD        EVALUATION TIME:   6 a.m.       CHIEF COMPLAINT:   Chest pain.       HISTORY OF PRESENT ILLNESS:   A 60 year old female comes in stating that since midnight last night she has    had a pressure feeling on her sternal area that radiates through to her back,     feels worse with deep inhalation.  She says it has been constant,    positionally feels better if sitting up, worse with laying down.  Had a    similar pain 3 weeks ago, which she did not seek medical evaluation for and it    went away on its own.  She has not experienced it again until this evening.     She relates that she runs  multiple times a week regularly and does not get    symptoms while exerting herself and does not feel short of breath exerting    herself and she has not had a cough, fever, or chills.  Denies hemoptysis.     She has had some mild swelling in her left foot without pain recently.  No    calf pain.       REVIEW OF SYSTEMS:   CONSTITUTIONAL:  Denies fever, denies chill.   EYES:  No visual symptoms.    ENT:  No sore throat, runny nose, or other URI symptoms.    RESPIRATORY:  Feels short of breath with the pain present, has not been    exertionally dyspneic.   CARDIOVASCULAR:  Chest pressure.   GASTROINTESTINAL:  No vomiting, diarrhea, or abdominal pain.     MUSCULOSKELETAL:  No joint pain or swelling.     INTEGUMENTARY:  No rashes.    NEUROLOGICAL:  No headaches, sensory or motor symptoms.         PAST MEDICAL HISTORY:   Parkinson's, she has had vein stripping, hysterectomy, spinal surgery, tubal    ligation.       SOCIAL HISTORY:   Does not smoke.       FAMILY HISTORY:   Mom had heart disease.       MEDICATIONS:   Listed and reviewed in [facility].        ALLERGIES:   NONE.       PHYSICAL EXAMINATION:   GENERAL APPEARANCE:  The patient appears well developed and well nourished.     Appearance and behavior are age and situation appropriate.    VITAL SIGNS:  Blood pressure 99/64, pulse 82, respirations 15, temperature    98.6, 98% on room air, 7 out of 10  pain.   HEENT:  Eyes:  Conjunctivae clear, lids normal.  Pupils equal, symmetrical,    and normally reactive.  Mouth/Throat:  Surface of the pharynx, palate, and    tongue are pink, moist, and without lesions.      NECK:  Supple, nontender, symmetrical, no masses or JVD, trachea midline,    thyroid not enlarged, nodular, or tender.  No  cervical or submandibular    lymphadenopathy palpated.     RESPIRATORY:  The patient has some diminished breath sounds.  She does not    appear tachypneic, can speak to me in full sentences.   CARDIOVASCULAR:  Heart  regular without murmur, gallop or thrill.  There is no    peripheral edema.  Her calves are soft and nontender.  She has equal distal    pulses.   SKIN:  No rash.   MUSCULOSKELETAL:  Good distal pulses.   NEUROLOGIC:  The patient is awake, alert, interacting appropriately, is    nontoxic in appearance.   PSYCHIATRIC:  The patient appears slightly anxious.       COURSE IN THE EMERGENCY ROOM:      ER records reviewed.  The patient had a stress echo on 04/2010, that showed    normal wall motion, borderline positive with 1 to 2 mm of upsloping ST    depression with echo images negative for ischemia suggesting of false positive    EKG portion of the test by Dr. Constance Haw.       CONTINUATION BY Dixie Dials, MD:        INITIAL ASSESSMENT AND MANAGEMENT PLAN:      A patient with chest pain, fairly atypical.  It has been constant since last    night.  It gets worse with lying down.  She runs daily and ran 6 miles    yesterday with no chest pain or shortness of breath.  Likely not ACS.  She    does have some pleuritic component to it and we will get a D-dimer since she     is low risk.  This is a new problem for this patient. Old records were    reviewed.  No additional relevant information was obtained.   The patient's    history was discussed with family members.  No additional relevant information    was obtained.          DIAGNOSTIC STUDIES:      CBC was normal.  D-dimer is 0.71.  BMP was normal.  CPK profile  was normal.     Troponin and myoglobin were normal.  Chest x-ray shows nothing acute.  CT of    the abdomen and pelvis shows no evidence of pulmonary embolism.         MEDICAL DECISION MAKING AND HOSPITAL COURSE:      The patient was given aspirin and morphine.  She is feeling better over time.     She was given IV fluids because she says she has had decreased p.o. intake.     I believe she has got 2 issues causing pain.  The first is she likely has an    esophageal stricture or esophageal dysmotility/spasm associated with her    parkinsonism.  She states that she has to avoid chicken because it often feels    like it gets stuck.  She indicates the area is just above her xiphoid    process.  For this issue, I am going to place her on Prevacid and have her    follow up with GI.  Her second issue is likely that of costochondritis.  On my    examination, she has tenderness at the costosternal junction on both sides.     There is no overlying erythema.  This would explain why it gets worse when    she lays flat and palpates her chest.  I do not  suspect ACS, given that she    can run 6 miles without shortness of breath, chest pain, or easy fatigability.     This has been an ongoing problem that has happened over the last few months.     I believe she can be safely discharged home to use NSAIDs.  I have    prescribed Vicodin to use as necessary and Prevacid for the esophageal    stricture.  I have referred her Dr. Dalia Heading from GI and back to Dr.    Minta Balsam who is her primary care physician.       DISPOSITION AND PLAN:      Home in stable condition.       DIAGNOSES:    1.  Acute chest wall pain/costochondritis.    2.  Esophageal stricture.           ___________________   Christiana Pellant MD   Dictated By: Marlana Salvage, PA       My signature above authenticates this document and my orders, the final    diagnosis (es), discharge prescription (s), and instructions in the [facility]    Pulsecheck record.   TC   D:05/03/2011   T: 05/03/2011 20:25:57   [id number]

## 2011-05-03 NOTE — Procedures (Signed)
Study ID: [id number]                                                      [facility]                                                      [address]                                                       [place], [place] [address]                            Exercise Stress Echocardiogram Report           Name: [name], [name]       Study Date: 05/04/2011 09:55 AM   MRN: [id number]                Patient Location: 2WST^2225^2225^C   DOB: 05/07/1951            Age: 60 yrs   Height: 67 in              Weight: 137 lb                          BSA: 1.7 m2   BP: 91/65 mmHg             HR: 101   Gender: Female             Account #: [id number]   Reason For Study: abnormal ecg   History: new onset of at. fib.   parkinson   family hx       Ordering Physician: KENNEDY, LISA   Performed By: Newcomb, Kathryn B, RDCS       Interpretation Summary   The Electrocardiographic Interpretation: negative by ECG criteria.   The Echocardiographic Interpretation: hyperkinetic wall motion.   The OverAll Impression : negative for ischemia with low likelihood for CAD.       Stress Results              Protocol:  Bruce              Target HR: 136 bpm         Maximum Predicted HR: 160 bpm                      Stress Duration:   9:22 mm:ss *                  Maximum Stress HR: 137 bpm *            METS: 10           Stress Comments   A treadmill exercise test according to Bruce protocol was performed. The   baseline ECG displays normal sinus rhythm. ; PAC. During Stress: ST Changes:   Depression upsloping &lt; 1MM. Arrhythmia induced   during stress: occasional   PAC's. Test was terminated due to target heart rate was achieved. Patient had   no chest pain. Normal blood pressure response.           I      WMSI = ?     % Normal = ?                                                                   Rest Images:                                                                    II      WMSI = ?     % Normal = ?                                                                   Peak Images:                                                                                                                               Segments  Size   X - Cannot   1 - Normal   2 -         3 - Akinetic 4 -          1-2     small   Interpret                 Hypokinetic              Dyskinetic   3-5     moder   ate   5 -                                                             6-14    large   Aneurysmal                                                        15-16   diffu   se               Left Ventricle   The left ventricular chamber size at rest is normal. The left ventricular   chamber size at peak stress is smaller.       _____________________________________________________________________________   __           Electronically signed byDr. Joseph Sposato, MD   05/04/2011 11:46 AM

## 2011-05-03 NOTE — ED Provider Notes (Signed)
MEDICATION ADMINISTRATION SUMMARY              Drug Name: *Sodium Chloride 0.9%, Intravenous, Dose Ordered: 100         mL/hr, Route: IV Fluid, Status: Given, Time: 00:00 05/04/2011,          Drug Name: *Diltiazem Hydrochloride, Dose Ordered: 5-15 mg/hr, Route:         IV Med Infusion, Status: Held, Time: 23:03 05/03/2011,          Drug Name: *Sodium Chloride 0.9%, Intravenous, Dose Ordered: 500 mL,         Route: IV Fluid, Status: Given, Time: 22:57 05/03/2011,          Drug Name: Tylenol, Dose Ordered: 650 mg, Route: Oral, Status: Given,         Time: 22:56 05/03/2011,          Drug Name: Diltiazem Hydrochloride, Dose Ordered: 10 mg, Route: IV         Push, Status: Given, Time: 22:55 05/03/2011,          Drug Name: *Sodium Chloride 0.9%, Intravenous, Dose Ordered: 1000 mL,         Route: IV Fluid, Status: Given, Time: 21:38 05/03/2011, *Additional         information available in notes, Detailed record available in         Medication Service section.       KNOWN ALLERGIES   NKDA: Source: Patient       Domingo Dimes Caleen Essex May 03, 2011 18:17 [facility])   TRIAGE NOTES:  Patient states intermittent chest pain that waxes         and wanes. Caleen Essex May 03, 2011 18:17 [facility])   PATIENT: NAME: Rotenberry, Ilynn H, AGE: 60, GENDER: female, DOB: [birth year]         March 15, 1951, TIME OF GREET: Fri May 03, 2011 17:53, SSN: [id number],         KG WEIGHT: 62.1, HEIGHT: 170cm, MEDICAL RECORD NUMBER: [id number],         ACCOUNT NUMBER: [id number], PCP: Marquita Palms,. Caleen Essex May 03, 2011 18:17 [facility])   ADMISSION: URGENCY: 2, TRANSPORT: Ambulatory, DEPT: Emergency,         BED: WAITING. Caleen Essex May 03, 2011 18:17 [facility])   VITAL SIGNS: Pulse 85, Resp 18, Temp 98.9, (Oral), Pain 4, O2 Sat         100, on Room air, Time 05/03/2011 18:15. (18:15 [facility])   COMPLAINT:  Cp. Caleen Essex May 03, 2011 18:17 [facility])   PRESENTING COMPLAINT:  Intermittent chest pain X three weeks.         Patient seen here this morning for same complaint and discharged          however sent back to ED by GI doctor. (19:04 BNP)   PAIN: Patient complains of pain, On a scale 0-10 patient rates         pain as 4, Radiates to neck, back. Described as tightness/heaviness.,         Pain is constant. (19:04 BNP)   IMMUNIZATIONS:  Immunizations up to date. (19:04 BNP)   LMP: LMP: Hysterectomy. (19:04 BNP)   TREATMENT PRIOR TO ARRIVAL: None. (19:04 BNP)   TB SCREENING: TB screen negative for this patient. (19:04         BNP)   ABUSE SCREENING: Patient denies physical abuse or threats. (19:04         BNP)   FALL  RISK: Patient has a low risk of falling. (19:04 BNP)   SUICIDAL IDEATION: Suicidal ideation is not present. (19:04         BNP)   ADVANCE DIRECTIVES: Patient does not have advance directives.         (19:04 BNP)   PROVIDERS: TRIAGE NURSE: Raliegh Scarlet Cutchins, RN. Caleen Essex May 03, 2011 18:17 [facility])   PREVIOUS VISIT ALLERGIES: Nkda. Caleen Essex May 03, 2011 18:17         [facility])       PRESENTING PROBLEM Caleen Essex May 03, 2011 18:17 [facility])      Presenting problems: Chest Pain - Adult.       CURRENT MEDICATIONS (18:18 [facility])   Vicodin:  1 tab(s) Oral As needed every four hours. x 500 Mg-5 Mg         - Oral - Q4PRN.   Omega-3:  2 tab(s) Oral once a day.   Azilect:  1 mg Oral once a day.   Centrum Silver Women's:  1 tab(s) Oral once a day.   Prevacid:  1 tab Oral once a day. x 30 mg - Oral - QDAY.   Calcium 600+D:  1 tab(s) Oral once a day.   Trihexyphenidyl Hydrochloride:  2 mg Oral 3 times a day.   Carbidopa-Levodopa:  25/100 mg Oral 3 times a day.   Requip XL:  8 mg Oral once a day.       MEDICATION SERVICE   Diltiazem Hydrochloride:  Order: Diltiazem Hydrochloride -         Dose: 10 mg : IV Push         Ordered by: Micki Riley, M.D.         Entered by: Micki Riley, M.D. Fri May 03, 2011 22:39 ,          Acknowledged by: Glenetta Borg, RN Caleen Essex May 03, 2011 22:42         Documented as given by: Glenetta Borg, RN Caleen Essex May 03, 2011 22:55           Patient, Medication, Dose, Route and Time verified prior to         administration.          Amount given: 10 mg, IV SITE #1 into right antecubital, IV SITE #1         IVP, initial medication, Slowly, Connections checked prior to         administration, Line traced prior to administration, Catheter         placement confirmed via flush prior to administration, IV site         without signs or symptoms of infiltration during medication         administration, No swelling during administration, No drainage during         administration, IV flushed after administration, Correct patient,         time, route, dose and medication confirmed prior to administration,         Patient advised of actions and side-effects prior to administration,         Allergies confirmed and medications reviewed prior to administration.   Diltiazem Hydrochloride:  Order: Diltiazem Hydrochloride -         Dose: 5-15 mg/hr : IV Med Infusion         Notes: titrate to pulse 80-100, keep SBP &gt; 100         Ordered by: Micki Riley, M.D.  Entered by: Micki Riley, M.D. Fri May 03, 2011 22:39 ,          Acknowledged by: Glenetta Borg, RN [facility] May 03, 2011 22:42,          Held by: Glenetta Borg, RN Caleen Essex May 03, 2011 23:03 Reason: Vital signs         stabilized.   Sodium Chloride 0.9%, Intravenous:  Order: Sodium Chloride 0.9%,         Intravenous (Sodium Chloride) - Dose: 1000 mL : IV Fluid         Notes: Bolus         *Reminder* Enter reason for fluid below         For Hydration         Ordered by: Micki Riley, M.D.         Entered by: Micki Riley, M.D. Fri May 03, 2011 21:18 ,          Acknowledged by: Glenetta Borg, RN Caleen Essex May 03, 2011 21:28         Documented as given by: Glenetta Borg, RN Caleen Essex May 03, 2011 21:38          Patient, Medication, Dose, Route and Time verified prior to         administration.          Amount given: 1000 ml, IV SITE #1 into right antecubital, IV SITE #1          IV fluids established, IV SITE #1 1st bag hung, amount 1 Liter, IV         SITE #1 bolus of 1000 ml established, IV SITE #1 Rate of bolus, wide         open, via primary tubing.    : Follow Up : _IV SITE #1:_, IV fluid infusion discontinued, on         Fri May 03, 2011 23:04, Total fluid hydration time IV site 1 1 hour,         30 minutes, ., Total amount infused: 1000 ml. (23:04 BNP)   Sodium Chloride 0.9%, Intravenous:  Order: Sodium Chloride 0.9%,         Intravenous (Sodium Chloride) - Dose: 500 mL : IV Fluid         Notes: Bolus         *Reminder* Enter reason for fluid below         For Hydration         Ordered by: Micki Riley, M.D.         Entered by: Micki Riley, M.D. Fri May 03, 2011 22:41 ,          Acknowledged by: Glenetta Borg, RN Caleen Essex May 03, 2011 22:57         Documented as given by: Glenetta Borg, RN Caleen Essex May 03, 2011 22:57          Patient, Medication, Dose, Route and Time verified prior to         administration.          Amount given: 500 ml, IV SITE #1 into right antecubital, IV SITE #1         IV fluids established, IV SITE #1 2nd bag hung, amount 1 Liter, IV         SITE #1 bolus of 500 ml established, IV SITE #1 Rate of bolus, wide         open, via pump tubing, Connections checked prior to administration,  Line traced prior to administration, Catheter placement confirmed via         flush prior to administration, IV site without signs or symptoms of         infiltration during medication administration, No swelling during         administration, No drainage during administration, IV flushed after         administration, Correct patient, time, route, dose and medication         confirmed prior to administration, Patient advised of actions and         side-effects prior to administration, Allergies confirmed and         medications reviewed prior to administration.    : Follow Up : _IV SITE #1:_, IV fluid infusion discontinued, on          Sat May 04, 2011 00:00, Total fluid hydration time IV site 1 1 hour,         5 minutes, ., Total amount infused: 500. (Sat May 04, 2011 00:27         CDB[facility])   Sodium Chloride 0.9%, Intravenous:  Order: Sodium Chloride 0.9%,         Intravenous (Sodium Chloride) - Dose: 100 mL/hr : IV Fluid         Notes: *Reminder* Enter reason for fluid below         For Hydration         Ordered by: Micki Riley, M.D.         Entered by: Micki Riley, M.D. Fri May 03, 2011 22:29          Documented as given by: Archie Balboa, RN Sat May 04, 2011 00:00          Patient, Medication, Dose, Route and Time verified prior to         administration.          Amount given: 0000, Connections checked prior to administration,         Line traced prior to administration, Catheter placement confirmed via         flush prior to administration, IV site without signs or symptoms of         infiltration during medication administration, No swelling during         administration, No drainage during administration, IV flushed after         administration, Correct patient, time, route, dose and medication         confirmed prior to administration, Patient advised of actions and         side-effects prior to administration, Allergies confirmed and         medications reviewed prior to administration.    : Follow Up : _IV SITE #1:_, IV fluid infusion continued upon         transfer from emergency department, on Sat May 04, 2011 00:15, 15         minutes, ., Total amount infused: 100. (Sat May 04, 2011 00:28         [facility])   Tylenol:  Order: Tylenol (Acetaminophen) - Dose: 650 mg         : Oral         Ordered by: Micki Riley, M.D.         Entered by: Micki Riley, M.D. Fri May 03, 2011 22:39 ,          Acknowledged by: Glenetta Borg, RN Caleen Essex May 03, 2011 22:42  Documented as given by: Glenetta Borg, RN Fri May 03, 2011 22:56          Patient, Medication, Dose, Route and Time verified prior to         administration.           Amount given: 650 mg, Site: Medication administered P.O., Correct         patient, time, route, dose and medication confirmed prior to         administration, Patient advised of actions and side-effects prior to         administration, Allergies confirmed and medications reviewed prior to         administration.       ORDERS   IV- Saline Lock:  Ordered for: Tsuchitani, M.D., [place] Dec         Status: Done by Pernell Dupre, PM, Delice Bison May 03, 2011 19:34. (19:13         KMJ)   MYOGLOBIN (BLOOD):  Ordered for: Tsuchitani, M.D., [place] Dec         Status: Done by System Fri May 03, 2011 20:08. (19:13 [facility])   BASIC METABOLIC PANEL:  Ordered for: Hervey Ard, M.D., [place] Dec         Status: Done by System Fri May 03, 2011 20:06. (19:13 KM[facility])   TROPONIN I:  Ordered for: Hervey Ard, M.D., [place] Dec         Status: Done by System Fri May 03, 2011 20:08. (19:13 KMJ)   O2 sat Monitor:  Ordered for: Tsuchitani, M.D., [place] Dec         Status: Done by Raynelle Jan, RN, Lavona Mound May 03, 2011 19:17. (19:13         KMJ)   CBC, AUTOMATED DIFFERENTIAL:  Ordered for: Hervey Ard, M.D., [place] Dec         Status: Done by System Fri May 03, 2011 19:39. (19:13 [facility])   Cardiac Monitor:  Ordered for: Hervey Ard, M.D., [place] Dec         Status: Done by Raynelle Jan, RN, Lavona Mound May 03, 2011 19:17. (19:13         [facility])   CPK PROFILE:  Ordered for: Hervey Ard, M.D., [place] Dec         Status: Done by System Fri May 03, 2011 20:08. (19:13 KMJ)   BP Monitor:  Ordered for: Hervey Ard, M.D., [place] Dec         Status: Done by Raynelle Jan, RN, Lavona Mound May 03, 2011 19:17. (19:13         [facility])   BP Cuff Adult Regular:  Ordered for: Tsuchitani, M.D., [place] Dec         Status: Active. (20:06 BNP)   Elita Boone IV Cath:  Ordered for: Tsuchitani, M.D., [place] Dec         Status: Active. (20:06 BNP)   IV Start kit:  Ordered for: Tsuchitani, M.D., [place] Dec         Status: Active. (20:06 BNP)   MONITOR ELECTRODE:  Ordered for: Tsuchitani, M.D., [place] Dec         Status: Active. (20:06 BNP)    CONTINUOUS PULSE OX:  Ordered for: Tsuchitani, M.D., [place] Dec         Status: Active. (20:06 BNP)   page dr Sedonia Small:  Ordered for: Hervey Ard, M.D., [place] Dec         Status: Done by Colon, Bjorn Pippin May 03, 2011 20:24. (20:10         SNT0)   12 LEAD EKG:  Ordered for: Tsuchitani, M.D., [place] Dec  Status: Active. (20:55 BNP)      Ordered for: Tsuchitani, M.D., [place] Dec         Status: Active         Comment: Repeat. (21:01 BNP)   T4 FREE:  Ordered for: Tsuchitani, M.D., [place] Dec         Status: Done by System Fri May 03, 2011 22:00. (21:13 SNT0)   THYROID STIMULATING HORMONE:  Ordered for: Tsuchitani, M.D., [place] Dec         Status: Done by System Fri May 03, 2011 22:00. (21:13 SNT0)   MAGNESIUM (BLOOD):  Ordered for: Tsuchitani, M.D., [place] Dec         Status: Done by System Fri May 03, 2011 21:53. (21:13 SNT0)   repage dr Kyung Rudd:  Lenna Gilford for: Hervey Ard, M.D., [place] Dec         Status: Done by Colon, Bjorn Pippin May 03, 2011 22:02. (21:58         SNT0)   HOSPITAL OBSERVATION TELE for Dr Sedonia Small:  Ordered for:         Hervey Ard, M.D., [place] Dec         Status: Done by Colon, Bjorn Pippin May 03, 2011 22:43. (22:40         SNT0)   Please order Dual I-Med Pump:  Ordered for: Tsuchitani, M.D.,         [place] Dec         Status: Done by Colon, Bjorn Pippin May 03, 2011 22:46. (22:42         BNP)       NURSING ASSESSMENT: CARDIOVASCULAR (19:04 BNP)   CONSTITUTIONAL: Complex assessment performed, History obtained         from patient, Patient arrives, via hospital wheelchair, Gait steady,         Patient appears comfortable, Patient cooperative, Patient alert,         Oriented to person, place and time, Skin warm, Skin dry, Skin normal         in color, Mucous membranes pink, Mucous membranes moist, Patient is         well-groomed.   PAIN: pressure pain, midsternal, Pain radiates, neck and back,         Onset of pain Three weeks, intermittent, on a scale 0-10 patient         rates pain as 4, Pain exacerbated by, Deep inhalation.    CARDIOVASCULAR: Cardiovascular assessment findings include heart         rate normal, Heart rhythm normal sinus, Heart sounds normal,         Bilateral blood pressures equal, Left radial pulse +3(easily         palpated, considered normal), Right radial pulse +3(easily palpated,         considered normal), Left dorsalis pedis pulse +3(easily palpated,         considered normal), Right dorsalis pedis pulse +3(easily palpated,         considered normal), No associated diaphoresis, no associated dyspnea,         no associated dizziness, no associated edema, no associated         palpitations, no associated paresthesias, no associated syncopal         episode, no associated weakness, No history of pulmonary embolism.   RESPIRATORY/CHEST: Respiratory assessment findings include         respiratory effort easy, Respirations regular, Conversing normally,         no signs of distress, Breath sounds clear, to  bilateral upper lobes,         to bilateral lower lobes, Neck and chest exam findings include         trachea midline, Chest expansion equal, Chest movement symmetrical,         no jugular vein distension, Tenderness, to Midsternal, no crepitus         noted, no subcutaneous emphysema noted, no deformity noted, no         associated cough noted, no associated fever, no associated fume         exposure.   NOTES: Notes: Patient reports when she eats sometimes, it feels         as if the food gets stuck and she has trouble getting it down. GI         doctor aware and told patient she needed an endoscopy along with a         stress test.   SAFETY: Side rails up, Cart/Stretcher in lowest position, Call         light within reach, Hospital ID band on.       NURSING PROCEDURE: ADMISSION (23:32 CDB0)   ADMISSION: Patient admitted to a telemetry unit, room number         2225, Report called to, eva rn, Provided opportunity to answer         questions, Admission orders received and completed, Bed assigned at          2250, Report called at 2325, Acuity level urgent, Transported via         cart/stretcher, Accompanied by registered nurse, Transported with         monitor, Transported with IV fluids, Transported with saline lock,         Summary of Care printed.       NURSING PROCEDURE: CARDIAC MONITOR (18:59 SRH8)   PATIENT IDENTIFIER: Patient's identity verified by patient         stating name, Patient's identity verified by patient stating birth         date, Patient's identity verified by hospital ID bracelet, Patient         actively involved in identification process.   CARDIAC MONITOR: Cardiac monitoring indicated for complaint of         chest pain, Patient placed on cardiac monitor, Heart rate: 86,         showing normal sinus rhythm, without ectopy, Patient placed on         non-invasive blood pressure monitor, with disposable blood pressure         cuff applied, Patient placed on continuous pulse oximetry,         Adult/pediatric oxisensor applied.   NOTES: Patient tolerated procedure well.   SAFETY: Side rails up, Cart/Stretcher in lowest position, Family         at bedside, Call light within reach, Hospital ID band on.       NURSING PROCEDURE: EKG CHART (18:17 LSG0)   PATIENT IDENTIFIER: Patient's identity verified by patient         stating name, Patient's identity verified by patient stating birth         date, Patient's identity verified by hospital ID bracelet, Patient         actively involved in identification process.   EKG: EKG indicated for complaint of chest pain, 12 lead EKG         performed on the left chest, done by l gadkowski, first EKG.  FOLLOW-UP: After procedure, EKG for interpretation given to Dr.         Barry Dienes, EKG was given to Dr. at 1755.   NOTES: Patient tolerated procedure well.   SAFETY: Side rails up, Cart/Stretcher in lowest position, Call         light within reach, Hospital ID band on.       NURSING PROCEDURE: IV (19:34 WG0)    PATIENT IDENITIFIER: Patient's identity verified by patient         stating name, Patient's identity verified by patient stating birth         date, Patient's identity verified by hospital ID bracelet, Patient         actively involved in identification process.   IV SITE 1: IV established, to the right antecubital, using a 20         gauge catheter, in one attempt, Saline lock established, Flushed with         normal saline (mls): 10, Labs drawn at time of placement, labeled in         the presence of the patient and sent to lab, Labs drawn at 1930,         Tourniquet removed from patient after procedure., Labs labeled in the         presence of the patient and then sent to the Lab.   FOLLOW-UP SITE 1: After procedure, sterile transparent dressing         applied, After procedure, no drainage at IV site, After procedure, no         swelling at IV site, After procedure, no redness at IV site.       NURSING PROCEDURE: NURSE NOTES   NURSES NOTES: Notes: Patient's heart rate now 126 showing         irregularity on the cardiac monitor. Patient's pain increased from         4/10 to 8/10. Dr. Hervey Ard made aware and repeat EKG is being         done.. (21:01 BNP)     Notes: report faxed to [facility]. (23:13 CDB0)       DIAGNOSIS (22:29 SNT0)   FINAL: PRIMARY: new onset afib, ADDITIONAL: chest pain         evaluation.       DISPOSITION   PATIENT:  Disposition Type: Observation, Disposition: Hospital         Observation Telemetry, Condition: Stable. (22:40 SNT0)      Patient left the department. (Sat May 04, 2011 00:30 CDB0)   Key:     BNP=Priddy, RN, Museum/gallery exhibitions officer  CDB0=Bruce, RN, Corrine  KMJ=Jones, PA-C, [place]     LEC1=Cutchins, RN, Raliegh Scarlet  LSG0=Gadkowski,PM, Misty Stanley  SNT0=Tsuchitani,     M.D., [place] Dec     SRH8=Huber, PM, Carola Frost, PM, Toniann Fail

## 2011-05-03 NOTE — Procedures (Signed)
Test Reason : Chest pain   Blood Pressure : ***/*** mmHG   Vent. Rate : 118 BPM     Atrial Rate : 441 BPM      P-R Int : 000 ms          QRS Dur : 078 ms       QT Int : 342 ms       P-R-T Axes : 000 047 029 degrees      QTc Int : 479 ms   Atrial fibrillation with rapid ventricular response   Septal infarct (cited on or before 03-May-2011)   Abnormal ECG   When compared with ECG of 03-May-2011 05:59,   Atrial fibrillation has replaced Sinus rhythm   Confirmed by Lauree Chandler ([id number]) on 05/04/2011 4:49:14 PM   Referred By:             Gay Filler By: Lauree Chandler

## 2011-05-03 NOTE — Procedures (Signed)
Test Reason : Chest pain   Blood Pressure : ***/*** mmHG   Vent. Rate : 083 BPM     Atrial Rate : 083 BPM      P-R Int : 116 ms          QRS Dur : 078 ms       QT Int : 380 ms       P-R-T Axes : 045 -04 062 degrees      QTc Int : 446 ms   Normal sinus rhythm   Septal infarct , age undetermined   Abnormal ECG   No previous ECGs available   Confirmed by Rosario Jacks, M.D., Ramin ([id number]) on 05/03/2011 9:03:38 PM   Referred By:             Overread By: Cyndia Diver, M.D.

## 2011-05-03 NOTE — Procedures (Signed)
Test Reason : Chest pain   Blood Pressure : ***/*** mmHG   Vent. Rate : 078 BPM     Atrial Rate : 078 BPM      P-R Int : 122 ms          QRS Dur : 086 ms       QT Int : 378 ms       P-R-T Axes : 027 037 054 degrees      QTc Int : 430 ms   Normal sinus rhythm   Septal infarct , age undetermined   Abnormal ECG   When compared with ECG of 03 May 2011 05:59:12 No significant change was found   Confirmed by Lauree Chandler ([id number]) on 05/04/2011 4:52:45 PM   Referred By:             Gay Filler By: Lauree Chandler

## 2011-05-03 NOTE — Procedures (Signed)
Study ID: [id number]                                                      [facility]                                                      [address] [place], [place] [address]                            Exercise Stress Echocardiogram Report           Name: Lindsay [name], Lindsay [name]       Study Date: 05/04/2011 09:55 AM   MRN: [id number]                Patient Location: [id number]^M   DOB: December 08, 1950            Age: 60 yrs   Height: 67 in              Weight: 137 lb                          BSA: 1.7 m2   BP: 91/65 mmHg             HR: 101   Gender: Female             Account #: [id number]   Reason For Study: abnormal ecg   History: new onset of at. fib.   parkinson   family hx       Ordering Physician: Sedonia Small   Performed By: Baron Hamper, RDCS       Interpretation Summary   The Electrocardiographic Interpretation: negative by ECG criteria.   The Echocardiographic Interpretation: hyperkinetic wall motion.   The OverAll Impression : negative for ischemia with low likelihood for CAD.       Stress Results              Protocol:  Bruce              Target HR: 136 bpm         Maximum Predicted HR: 160 bpm                      Stress Duration:   9:22 mm:ss *                  Maximum Stress HR: 137 bpm *            METS: 10           Stress Comments   A treadmill exercise test according to Bruce protocol was performed. The   baseline ECG displays normal sinus rhythm. ; PAC. During Stress: ST Changes:   Depression upsloping &lt; . Arrhythmia induced  during stress: occasional   PAC's. Test was terminated due to target heart rate was achieved. Patient had   no chest pain. Normal blood pressure response.           I      WMSI = ?     % Normal = ?                                                                   Rest Images:                                                                    II      WMSI = ?     % Normal = ?                                                                   Peak Images:                                                                                                                               Segments  Size   X - Cannot   1 - Normal   2 -         3 - Akinetic 4 -          1-2     small   Interpret                 Hypokinetic              Dyskinetic   3-5     moder   ate   5 -                                                             6-14    large   Aneurysmal  15-16   diffu   se               Left Ventricle   The left ventricular chamber size at rest is normal. The left ventricular   chamber size at peak stress is smaller.       _____________________________________________________________________________   __           Electronically signed byDr. Lauree Chandler, MD   05/04/2011 11:46 AM

## 2012-03-19 LAB — METABOLIC PANEL, BASIC
BUN: 15 mg/dl (ref 7–25)
CO2: 29 mEq/L (ref 21–32)
Calcium: 9.6 mg/dl (ref 8.5–10.1)
Chloride: 104 mEq/L (ref 98–107)
Creatinine: 1 mg/dl (ref 0.6–1.3)
GFR est AA: >60
GFR est non-AA: 60
Glucose: 110 mg/dl — ABNORMAL HIGH (ref 74–106)
Potassium: 4 mEq/L (ref 3.5–5.1)
Sodium: 141 mEq/L (ref 136–145)

## 2012-03-19 LAB — CBC WITH AUTOMATED DIFF
BASOPHILS: 0 % (ref 0–3)
EOSINOPHILS: 2 % (ref 0–5)
HCT: 38.8 % (ref 37.0–50.0)
HGB: 12.9 gm/dl (ref 12.4–17.2)
LYMPHOCYTES: 29 % (ref 28–48)
MCH: 31.2 pg (ref 25.4–34.6)
MCHC: 33.4 gm/dl (ref 30.0–36.0)
MCV: 93.4 fL (ref 80.0–98.0)
MONOCYTES: 11 % (ref 1–13)
MPV: 7.6 fL (ref 6.0–10.0)
NEUTROPHILS: 57 % (ref 34–64)
NRBC: 0 (ref 0–0)
PLATELET: 233 10*3/uL (ref 140–450)
RBC: 4.15 M/uL (ref 3.60–5.20)
RDW: 13.5 % (ref 11.5–14.0)
WBC: 9 10*3/uL (ref 4.0–11.0)

## 2012-03-19 LAB — CKMB PROFILE
CK - MB: 3.8 ng/ml — ABNORMAL HIGH (ref 0.0–3.6)
CK-MB Index: 1.9 % (ref 0.0–4.9)
CK: 202 U/L — ABNORMAL HIGH (ref 26–192)

## 2012-03-19 LAB — TROPONIN I: Troponin-I: <0.015 ng/ml (ref 0.00–0.09)

## 2012-03-19 NOTE — ED Provider Notes (Signed)
[facility]  EMERGENCY DEPARTMENT TREATMENT REPORT  NAME:  Alleva, Lindsay [name]  SEX:   F  ADMIT: 03/19/2012  DOB:   March 17, 1951  MR#    [id number]  ROOM:  ER24  TIME SEEN: 04 48 AM  ACCT#  [id number]    cc: Rosana Berger MD    PRIMARY CARE PHYSICIAN:  Dr. Rosana Berger    TIME OF EVALUATION:  0321    CHIEF COMPLAINT:  Chest pain.    HISTORY OF PRESENT ILLNESS:  A 61 year old female presents complaining of an 8 out of 10 constant, sharp,   right-sided chest pain that radiates into her back and her shoulder.  It began   around 2000 tonight.  She denies shortness of breath currently.  She states   that today she was painting her carport and believes that the pain is due to   this.  She has had no recent travel history.  No history of any acute MI, DVT   or PE.  She had her last cardiac stress test done about 1 year ago.  She   states that she has a pulmonologist appointment tomorrow to evaluate shortness   of breath that she has had intermittently for quite some time; however,   currently she does not feel short of breath.    REVIEW OF SYSTEMS:  CONSTITUTIONAL:  No fevers or recent illnesses.  EYES:  No visual changes.  ENT:  No URI symptoms.  HEMATOLOGIC:  No bruising.  RESPIRATORY:  No current shortness of breath.  CARDIOVASCULAR:  Positive for right-sided chest pain.  GASTROINTESTINAL:  No abdominal pain, no nausea, vomiting or diarrhea.  VASCULAR:  No peripheral edema.  INTEGUMENTARY:  No open lesions.  NEUROLOGICAL:  No headaches.    PAST MEDICAL HISTORY:  Parkinson's, hysterectomy, right leg vein stripping, tubal ligation, spinal   surgery, cataracts.    FAMILY HISTORY:  Mother had an acute MI.  A sister has atrial fibrillation.    SOCIAL HISTORY:  Nonsmoker.    ALLERGIES:  ALBUTEROL, LIDOCAINE.    MEDICATIONS:  Multiple and reviewed in Ibex.    PHYSICAL EXAMINATION:  VITAL SIGNS:  Blood pressure 143/76, pulse 69, respiratory rate 12,   temperature 98.1, O2 saturation is 100% on room air.  Pain is an 8 out of  10.  GENERAL APPEARANCE:  This is a well-developed, well-nourished, 61 year old   female in no acute distress.  HEENT:  Eyes:  Conjunctivae are clear.  ENT:  Mucous membranes are moist.  CARDIOVASCULAR:  Regular rate and rhythm.  No murmurs, rubs or gallops.  LUNGS:  Clear to auscultation bilaterally.  CHEST:  Symmetric.  Reproducible chest pain along her right chest wall into   her right shoulder and posteriorly along the right inferior chest wall.  There   are no overlying skin lesions, masses, scars, ecchymosis or erythema.  GASTROINTESTINAL:  The abdomen is soft and nontender, no right upper quadrant   tenderness.    VASCULAR:  No peripheral edema.  Calves are soft.    SKIN:  Warm and dry.  PSYCHIATRIC:  Judgment appears appropriate.    INITIAL ASSESSMENT AND MANAGEMENT PLAN:  A 61 year old female presents with chest pain.  An acute ischemic event must   be considered first while other etiologies, including infectious, pulmonary,   musculoskeletal are also considered in the differential.  Will obtain cardiac   enzymes, chest x-ray and EKG to further assess.     ED DIAGNOSTICS:  EKG showed sinus rhythm, rate of  75 beats per minute.  CBC and BMP were   normal.      COURSE IN THE EMERGENCY DEPARTMENT:  The patient declined her x-ray.  She stated that she had an x-ray done last   week, she did not want to have another done.  We explained to her that her   cardiac enzymes are still pending and that we need further assess her chest   pain.  She stated that she thought that this was most likely musculoskeletal   in etiology, as she had been painting today.  She did receive 325 mg aspirin   and stated that her pain had decreased.  We discussed with her staying for   further assessment of her chest pain, she declined.  We then discussed leaving   against medical advice and had her sign appropriate paperwork.  I believe she   is of sound mind to make this decision and her husband was at bedside while    this was discussed as well.  We explained to her that we would call her if she   did have abnormal troponin or her lab results.  At the time of this   dictation, the troponin is normal.  CPK and CPK-MB are just mildly elevated.    We explained to the patient that if she had any worsening of her symptoms that   she needed to return immediately, as an acute ischemic event could not be   ruled out.  She stated that she was going to followup with her pulmonologist   this morning and that she would return if she had any worsening of her   symptoms.    FINAL DIAGNOSIS:  Chest pain.    DISPOSITION AND PLAN:  The patient was discharged, she left against medical advice prior to full   cardiac evaluation.  She is stable at this time.  Advised to return   immediately if any worsening of her symptoms.    The patient was personally evaluated by myself and Dr. Dianna Rossetti, who agrees   with the above assessment and plan.      ___________________  Dianna Rossetti M.D.  Dictated By: Sheppard Penton, PA-C    My signature above authenticates this document and my orders, the final   diagnosis (es), discharge prescription (s), and instructions in the [facility]   Pulsecheck record.  DS  D:03/19/2012  T: 03/19/2012 05:32:57  [id number]  Authenticated by Jerilynn Som, M.D. On 03/26/2012 01:50:18 AM

## 2013-02-09 LAB — POC HEMATOCRIT
HCT: 38 % (ref 38–45)
HGB: 12.9 gm/dl (ref 12.4–17.2)

## 2013-02-09 NOTE — Procedures (Signed)
[facility]  PULMONARY REPORT  NAME:  [name], Lindsay  SEX:   F  DATE: 02/09/2013  SS: [id number]  DOB: Nov 19, 1950  MR#    [id number]  ROOM: CAR  BILLING: [id number]  REFERRING PHYSICIAN:        cc: Wendall Stade MD    REFERRING PHYSICIAN:  Dr. Kennon Portela    BRIEF HISTORY:  The patient is a 62 year old female with a history of chest tightness, dyspnea   on exertion and a history of smoking.  She was exposed to asbestos through   her father.    SPIROMETRY:  Spirometry is acceptable and repeatable.  FEV1/FVC ratio is 72%.    Prebronchodilator FEV1 is 2.45 liters which is 94%.  Prebronchodilator FVC is   3.41 liters which is 101%.  Bronchodilator testing was not performed.    LUNG VOLUMES:  Total lung capacity is 4.74 liters which is 91%.  Residual volume is 64%.    RV/TLC is 28% which is not increased.    DIFFUSION CAPACITY:  DLCO adjusted for hemoglobin of 13 is 56%.    SUMMARY:  This pulmonary function test did not show any evidence of obstruction,   restriction or hyperinflation and there is no evidence of air trapping.    Bronchodilator testing was not performed.    Diffusion capacity is moderately reduced.  Please correlate these findings   clinically.      ___________________  Zoe Lan MD  Dictated By: .   Nonie Hoyer  D:02/10/2013 10:36:42  T: 02/10/2013 10:53:23  [id number]  Authenticated by Zoe Lan MD On 02/12/2013 11:04:44 AM

## 2013-02-10 NOTE — Procedures (Signed)
[facility]  PULMONARY REPORT  NAME:  Lindsay [name], Lindsay [name]  SEX:   F  DATE: 02/09/2013  SS: [id number]  DOB: 11/11/1950  MR#    [id number]  ROOM: CAR  BILLING: [id number]  REFERRING PHYSICIAN:        cc: Eleanor DeGuzman-Berube MD    REFERRING PHYSICIAN:  Dr. Deguzman    BRIEF HISTORY:  The patient is a 62-year-old female with a history of chest tightness, dyspnea   on exertion and a history of smoking.  She was exposed to asbestos through   her father.    SPIROMETRY:  Spirometry is acceptable and repeatable.  FEV1/FVC ratio is 72%.    Prebronchodilator FEV1 is 2.45 liters which is 94%.  Prebronchodilator FVC is   3.41 liters which is 101%.  Bronchodilator testing was not performed.    LUNG VOLUMES:  Total lung capacity is 4.74 liters which is 91%.  Residual volume is 64%.    RV/TLC is 28% which is not increased.    DIFFUSION CAPACITY:  DLCO adjusted for hemoglobin of 13 is 56%.    SUMMARY:  This pulmonary function test did not show any evidence of obstruction,   restriction or hyperinflation and there is no evidence of air trapping.    Bronchodilator testing was not performed.    Diffusion capacity is moderately reduced.  Please correlate these findings   clinically.      ___________________  Penny Frisbie C Shade Kaley MD  Dictated By: .   SC  D:02/10/2013 10:36:42  T: 02/10/2013 10:53:23  [id number]  Authenticated by Jadien Lehigh C. Adreona Brand MD On 02/12/2013 11:04:44 AM

## 2014-07-19 ENCOUNTER — Encounter (HOSPITAL_COMMUNITY): Payer: Self-pay | Admitting: Emergency Medicine

## 2014-07-19 ENCOUNTER — Emergency Department (HOSPITAL_COMMUNITY): Payer: Medicare Other

## 2014-07-19 ENCOUNTER — Emergency Department (HOSPITAL_COMMUNITY)
Admission: EM | Admit: 2014-07-19 | Discharge: 2014-07-19 | Disposition: A | Discharge status: Home / Self Care | Payer: Medicare Other | Attending: Emergency Medicine | Admitting: Emergency Medicine

## 2014-07-19 DIAGNOSIS — M791 Myalgia, unspecified site: Secondary | ICD-10-CM

## 2014-07-19 DIAGNOSIS — S20309A Unspecified superficial injuries of unspecified front wall of thorax, initial encounter: Secondary | ICD-10-CM | POA: Insufficient documentation

## 2014-07-19 DIAGNOSIS — R0789 Other chest pain: Secondary | ICD-10-CM | POA: Diagnosis not present

## 2014-07-19 DIAGNOSIS — Y998 Other external cause status: Secondary | ICD-10-CM | POA: Diagnosis not present

## 2014-07-19 DIAGNOSIS — G20 Parkinson's disease: Secondary | ICD-10-CM | POA: Diagnosis not present

## 2014-07-19 DIAGNOSIS — S5291XA Unspecified fracture of right forearm, initial encounter for closed fracture: Secondary | ICD-10-CM | POA: Diagnosis not present

## 2014-07-19 DIAGNOSIS — R10811 Right upper quadrant abdominal tenderness: Secondary | ICD-10-CM | POA: Diagnosis not present

## 2014-07-19 DIAGNOSIS — Y92410 Unspecified street and highway as the place of occurrence of the external cause: Secondary | ICD-10-CM | POA: Diagnosis not present

## 2014-07-19 DIAGNOSIS — Y9389 Activity, other specified: Secondary | ICD-10-CM | POA: Diagnosis not present

## 2014-07-19 HISTORY — DX: Parkinson's disease without dyskinesia, without mention of fluctuations: G20.A1

## 2014-07-19 HISTORY — DX: Parkinson's disease: G20

## 2014-07-19 HISTORY — DX: Spinal stenosis, lumbar region without neurogenic claudication: M48.061

## 2014-07-19 LAB — COMPREHENSIVE METABOLIC PANEL
ALT: 9 U/L (ref 0–35)
ANION GAP: 7 (ref 5–15)
AST: 29 U/L (ref 0–37)
Albumin: 3.9 g/dL (ref 3.5–5.2)
Alkaline Phosphatase: 73 U/L (ref 39–117)
BUN: 16 mg/dL (ref 6–23)
CHLORIDE: 103 mmol/L (ref 96–112)
CO2: 25 mmol/L (ref 19–32)
CREATININE: 0.64 mg/dL (ref 0.50–1.10)
Calcium: 9.7 mg/dL (ref 8.4–10.5)
Glucose, Bld: 100 mg/dL — ABNORMAL HIGH (ref 70–99)
POTASSIUM: 3.5 mmol/L (ref 3.5–5.1)
Sodium: 135 mmol/L (ref 135–145)
TOTAL PROTEIN: 6.8 g/dL (ref 6.0–8.3)
Total Bilirubin: 0.4 mg/dL (ref 0.3–1.2)

## 2014-07-19 LAB — URINALYSIS, ROUTINE W REFLEX MICROSCOPIC
BILIRUBIN URINE: NEGATIVE
Glucose, UA: NEGATIVE mg/dL
Hgb urine dipstick: NEGATIVE
KETONES UR: NEGATIVE mg/dL
Nitrite: NEGATIVE
Protein, ur: NEGATIVE mg/dL
SPECIFIC GRAVITY, URINE: 1.026 (ref 1.005–1.030)
Urobilinogen, UA: 0.2 mg/dL (ref 0.0–1.0)
pH: 6.5 (ref 5.0–8.0)

## 2014-07-19 LAB — CBC WITH DIFFERENTIAL/PLATELET
BASOS PCT: 0 % (ref 0–1)
Basophils Absolute: 0 10*3/uL (ref 0.0–0.1)
Eosinophils Absolute: 0.3 10*3/uL (ref 0.0–0.7)
Eosinophils Relative: 4 % (ref 0–5)
HCT: 38.3 % (ref 36.0–46.0)
HEMOGLOBIN: 12.8 g/dL (ref 12.0–15.0)
Lymphocytes Relative: 22 % (ref 12–46)
Lymphs Abs: 1.8 10*3/uL (ref 0.7–4.0)
MCH: 29.8 pg (ref 26.0–34.0)
MCHC: 33.4 g/dL (ref 30.0–36.0)
MCV: 89.3 fL (ref 78.0–100.0)
MONOS PCT: 7 % (ref 3–12)
Monocytes Absolute: 0.6 10*3/uL (ref 0.1–1.0)
Neutro Abs: 5.4 10*3/uL (ref 1.7–7.7)
Neutrophils Relative %: 67 % (ref 43–77)
Platelets: 250 10*3/uL (ref 150–400)
RBC: 4.29 MIL/uL (ref 3.87–5.11)
RDW: 13 % (ref 11.5–15.5)
WBC: 8.1 10*3/uL (ref 4.0–10.5)

## 2014-07-19 LAB — URINE MICROSCOPIC-ADD ON

## 2014-07-19 MED ORDER — TRAMADOL HCL 50 MG PO TABS: 50.0000 mg | ORAL_TABLET | Freq: Four times a day (QID) | ORAL | Status: DC | PRN

## 2014-07-19 MED ORDER — ORPHENADRINE CITRATE ER 100 MG PO TB12: 100.0000 mg | ORAL_TABLET | Freq: Two times a day (BID) | ORAL | Status: DC | PRN

## 2014-07-19 MED ORDER — SODIUM CHLORIDE 0.9 % IV BOLUS (SEPSIS)
500.0000 mL | Freq: Once | INTRAVENOUS | Status: AC
  Administered 2014-07-19: 500 mL via INTRAVENOUS

## 2014-07-19 MED ORDER — IOHEXOL 300 MG/ML  SOLN
80.0000 mL | Freq: Once | INTRAMUSCULAR | Status: AC | PRN
  Administered 2014-07-19: 80 mL via INTRAVENOUS

## 2014-07-19 NOTE — ED Provider Notes (Signed)
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 of discharge. Discussed findings and CT abdomen and pelvis with contrast in great detail with attending physician, Dr. Venora Maples - recommended that patient can follow-up as outpatient, CT negative for acute abnormalities and labs unremarkable-reported the patient in follow-up as an outpatient. Patient stable, afebrile. Patient not septic appearing. Discharged patient. Referred patient to [facility] wellness Center and orthopedics. Discussed findings on the CT scan regarding intrahepatic and extrahepatic dilation and for following - daughter and patient understood. Discussed with patient to rest and stay hydrated-discussed with patient to elevate right arm at all times. Discussed with patient to closely monitor symptoms and if symptoms are to worsen or change to report back to the ED - strict return instructions given.  Patient agreed to plan of care, understood, all questions answered.   Jamse Mead, PA-C 07/19/14 1447  Jamse Mead, PA-C  07/19/14 [place], MD 07/19/14 [phone]
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
## 2014-07-19 NOTE — ED Notes (Signed)
Dr. Campos at bedside   

## 2014-07-19 NOTE — ED Notes (Signed)
Spoke with PA and MD.  Patient placed in a C-Collar.

## 2014-07-19 NOTE — Discharge Instructions (Signed)
Please call your doctor for a followup appointment within 24-48 hours. When you talk to your doctor please let them know that you were seen in the emergency department and have them acquire all of your records so that they can discuss the findings with you and formulate a treatment plan to fully care for your new and ongoing problems. Please call and set-up an appointment with Orthopedics and hand specialist Please rest and stay hydrated Please ice, elevate hand - fingers above nose Please avoid any physical or strenuous activity  Please take medications as prescribed - while on pain medications there is to be no drinking alcohol, driving, operating any heavy machinery. If extra please dispose in a proper manner. Please do not take any extra Tylenol with this medication for this can lead to Tylenol overdose and liver issues. While on muscle relaxers this can cause drowsiness so please no driving Please continue to monitor symptoms closely and if symptoms are to worsen or change (fever greater than 101, chills, sweating, nausea, vomiting, chest pain, shortness of breathe, difficulty breathing, weakness, numbness, tingling, worsening or changes to pain pattern, finger swelling, changes to skin color, fall, injury, headache, dizziness, visual changes) please report back to the Emergency Department immediately.    Cast or Splint Care Casts and splints support injured limbs and keep bones from moving while they heal. It is important to care for your cast or splint at home.  HOME CARE INSTRUCTIONS  Keep the cast or splint uncovered during the drying period. It can take 24 to 48 hours to dry if it is made of plaster. A fiberglass cast will dry in less than 1 hour.  Do not rest the cast on anything harder than a pillow for the first 24 hours.  Do not put weight on your injured limb or apply pressure to the cast until your health care provider gives you permission.  Keep the cast or splint dry. Wet casts  or splints can lose their shape and may not support the limb as well. A wet cast that has lost its shape can also create harmful pressure on your skin when it dries. Also, wet skin can become infected.  Cover the cast or splint with a plastic bag when bathing or when out in the rain or snow. If the cast is on the trunk of the body, take sponge baths until the cast is removed.  If your cast does become wet, dry it with a towel or a blow dryer on the cool setting only.  Keep your cast or splint clean. Soiled casts may be wiped with a moistened cloth.  Do not place any hard or soft foreign objects under your cast or splint, such as cotton, toilet paper, lotion, or powder.  Do not try to scratch the skin under the cast with any object. The object could get stuck inside the cast. Also, scratching could lead to an infection. If itching is a problem, use a blow dryer on a cool setting to relieve discomfort.  Do not trim or cut your cast or remove padding from inside of it.  Exercise all joints next to the injury that are not immobilized by the cast or splint. For example, if you have a long leg cast, exercise the hip joint and toes. If you have an arm cast or splint, exercise the shoulder, elbow, thumb, and fingers.  Elevate your injured arm or leg on 1 or 2 pillows for the first 1 to 3 days to decrease  swelling and pain.It is best if you can comfortably elevate your cast so it is higher than your heart. SEEK MEDICAL CARE IF:   Your cast or splint cracks.  Your cast or splint is too tight or too loose.  You have unbearable itching inside the cast.  Your cast becomes wet or develops a soft spot or area.  You have a bad smell coming from inside your cast.  You get an object stuck under your cast.  Your skin around the cast becomes red or raw.  You have new pain or worsening pain after the cast has been applied. SEEK IMMEDIATE MEDICAL CARE IF:   You have fluid leaking through the  cast.  You are unable to move your fingers or toes.  You have discolored (blue or white), cool, painful, or very swollen fingers or toes beyond the cast.  You have tingling or numbness around the injured area.  You have severe pain or pressure under the cast.  You have any difficulty with your breathing or have shortness of breath.  You have chest pain. Document Released: 05/31/2000 Document Revised: 03/24/2013 Document Reviewed: 12/10/2012 [facility] Patient Information 2015 [place], [place]. This information is not intended to replace advice given to you by your health care provider. Make sure you discuss any questions you have with your health care provider.  Radial Fracture You have a broken bone (fracture) of the forearm. This is the part of your arm between the elbow and your wrist. Your forearm is made up of two bones. These are the radius and ulna. Your fracture is in the radial shaft. This is the bone in your forearm located on the thumb side. A cast or splint is used to protect and keep your injured bone from moving. The cast or splint will be on generally for about 5 to 6 weeks, with individual variations. HOME CARE INSTRUCTIONS   Keep the injured part elevated while sitting or lying down. Keep the injury above the level of your heart (the center of the chest). This will decrease swelling and pain.  Apply ice to the injury for 15-20 minutes, 03-04 times per day while awake, for 2 days. Put the ice in a plastic bag and place a towel between the bag of ice and your cast or splint.  Move your fingers to avoid stiffness and minimize swelling.  If you have a plaster or fiberglass cast:  Do not try to scratch the skin under the cast using sharp or pointed objects.  Check the skin around the cast every day. You may put lotion on any red or sore areas.  Keep your cast dry and clean.  If you have a plaster splint:  Wear the splint as directed.  You may loosen the elastic around  the splint if your fingers become numb, tingle, or turn cold or blue.  Do not put pressure on any part of your cast or splint. It may break. Rest your cast only on a pillow for the first 24 hours until it is fully hardened.  Your cast or splint can be protected during bathing with a plastic bag. Do not lower the cast or splint into water.  Only take over-the-counter or prescription medicines for pain, discomfort, or fever as directed by your caregiver. SEEK IMMEDIATE MEDICAL CARE IF:   Your cast gets damaged or breaks.  You have more severe pain or swelling than you did before getting the cast.  You have severe pain when stretching your fingers.  There is a bad smell, new stains and/or pus-like (purulent) drainage coming from under the cast.  Your fingers or hand turn pale or blue and become cold or your loose feeling. Document Released: 11/14/2005 Document Revised: 08/26/2011 Document Reviewed: 02/10/2006 [facility] Patient Information 2015 [place], [place]. This information is not intended to replace advice given to you by your health care provider. Make sure you discuss any questions you have with your health care provider.   Emergency Department Resource Guide 1) Find a Doctor and Pay Out of Pocket Although you won't have to find out who is covered by your insurance plan, it is a good idea to ask around and get recommendations. You will then need to call the office and see if the doctor you have chosen will accept you as a new patient and what types of options they offer for patients who are self-pay. Some doctors offer discounts or will set up payment plans for their patients who do not have insurance, but you will need to ask so you aren't surprised when you get to your appointment.  2) Contact Your Local Health Department Not all health departments have doctors that can see patients for sick visits, but many do, so it is worth a call to see if yours does. If you don't know where your  local health department is, you can check in your phone book. The CDC also has a tool to help you locate your state's health department, and many state websites also have listings of all of their local health departments.  3) Find a Walk-in Clinic If your illness is not likely to be very severe or complicated, you may want to try a walk in clinic. These are popping up all over the country in pharmacies, drugstores, and shopping centers. They're usually staffed by nurse practitioners or physician assistants that have been trained to treat common illnesses and complaints. They're usually fairly quick and inexpensive. However, if you have serious medical issues or chronic medical problems, these are probably not your best option.  No Primary Care Doctor: - Call Health Connect at  [phone] - they can help you locate a primary care doctor that  accepts your insurance, provides certain services, etc. - Physician Referral Service- [phone]  Chronic Pain Problems: Organization         Address  Phone   Notes  [facility]  [phone] Patients need to be referred by their primary care doctor.   Medication Assistance: Organization         Address  Phone   Notes  [facility] Medication [facility] [address] [place], [place] [address] [phone] --Must be a resident of [facility] -- Must have NO insurance coverage whatsoever (no Medicaid/ Medicare, etc.) -- The pt. MUST have a primary care doctor that directs their care regularly and follows them in the community   [facility]ist  [phone]   [employer]  [phone]    Agencies that provide inexpensive medical care: Organization         Address  Phone   Notes  [facility]  [phone]   [facility]    [phone]   [facility] [address] [place], [place] [address] [phone]   [facility] [address] [place]  [address], [place] [phone]   Planned Parenthood    [phone]   [facility]    [phone])  [phone]   [facility]  [address], [place] Phone:  [phone], Fax:  [phone] Hours of Operation:  9 am - 6 pm, M-F.  Also accepts Medicaid/Medicare and self-pay.  [facility]  [address], [place] Phone: [phone], Fax: [phone]. Hours of Operation:  8:30 am - 5:30 pm, M-F.  Also accepts Medicaid and self-pay.  [facility] [place] [address], [place] Phone: [phone]   [facility] [address], [place], [place] [phone], Ext. [phone] Mondays & Thursdays: 7-9 AM.  First 15 patients are seen on a first come, first serve basis.    Medicaid-accepting [facility] Providers:  Organization         Address  Phone   Notes  [facility] [address], [place] [phone] Also accepts self-pay patients.  [facility] [address], [place]  [phone]   [facility] [address], [place] [phone]   [facility] [address] [place] [address], [place] [phone]) [phone]   Renaye RakersVeita Bland [address], [place]   [phone] Only accepts [place] Access [place] patients after they have their name applied to their card.   Self-Pay (no insurance) in [facility]:  Organization         Address  Phone   Notes  Sickle Cell Patients, [facility] [address], [place] [phone]   [facility] [address], [place] [phone]   [facility] [place]  [address], [place] [phone]   [facility]/Dr. Osei-Bonsu  [address] [place] [address], [place] or [phone], [place] [phone] Phone number for both [place] and [place] locations is the same.  [facility] and [facility] [address], [place] [phone]   [facility] [address] [place] [address], [place] or [address] [phone]   [facility] [address], [place] [phone], phone; [phone], fax Sees patients 1st and 3rd Saturday of every month.  Must not qualify for public or private insurance (i.e. Medicaid, Medicare, [place] Health Choice, Veterans' Benefits)  Household income should be no more than 200% of the poverty level The clinic cannot treat you if you are pregnant or think you are pregnant  Sexually transmitted diseases are not treated at the clinic.    Dental Care: Organization         Address  Phone  Notes  [facility] of [facility] [address], [place] [phone] Accepts children up to age 64 who are enrolled in [place] or [place] Health Choice; pregnant women with a Medicaid card; and children who have applied for Medicaid or [place] Health Choice, but were declined, whose parents can pay a reduced fee at time of service.  [facility] of [facility] [place]  [address], [place] [phone] Accepts children up to age 64 who are enrolled in [place] or [place] Health Choice; pregnant women with a Medicaid card; and children who have applied for Medicaid or [place] Health Choice, but were declined, whose parents can pay a reduced fee at time of service.  [facility]  [address] [place], [place] [phone] Patients are seen by appointment only. Walk-ins are not accepted. [facility] will see patients 47 years of age and older. Monday - Tuesday (8am-5pm) Most Wednesdays (8:30-5pm) $30 per visit, cash only  [facility]  [address], [facility] [phone] Patients are seen by appointment only. Walk-ins are not  accepted. [facility] will see patients 25 years of age and older. One Wednesday Evening (Monthly: Volunteer Based).  $30 per visit, cash only  [employer]  [phone] for adults; Children under age 58, call Graduate Pediatric Dentistry at [phone]. Children aged 39-14, please call [phone] to request a pediatric application.  Dental services are provided in all areas of dental care including fillings, crowns and bridges, complete and partial dentures, implants, gum treatment, root canals, and extractions. Preventive care is also provided. Treatment is provided to both adults and children. Patients are selected via a lottery and there is often a waiting list.   [facility] [address] [place]  [phone] [email or web address]   [facility] [address] [place], [place] [phone], Ext. [phone] Second and Fourth Thursday of each month, opens at 6:30 AM; Clinic ends at 9 AM.  Patients are seen on a first-come first-served basis, and a limited number are seen during each clinic.   [facility]  [address] [place], [place] [phone]   Eligibility Requirements You must have lived in [place], [place], or [place] counties for at least the last three months.   You cannot be eligible for state or federal sponsored [place], including [employer], [place], or [employer].   You generally cannot be eligible for healthcare insurance through your employer.    How to apply: Eligibility screenings are held every Tuesday and Wednesday afternoon from 1:00 pm until 4:00 pm. You do not need an appointment for the interview!  [facility] [address] [place], [place] [phone]   [facility] Health Department  [phone]   [facility] Health Department  [phone]   [facility] Health Department  [phone]    Behavioral Health Resources in the  Community: Intensive Outpatient Programs Organization         Address  Phone  Notes  [facility] [address] [place], [place] [phone]   [facility] Outpatient [address], [place], [place] [phone]   [facility] [address] [place], [place]  [phone]   [facility] Mental Health [address],  [place], [place] [phone] or [phone]   Substance Abuse Resources Organization         Address  Phone  Notes  [facility] Drug Services  [phone]   [facility]  [phone]   [facility]  [phone]   Floydene Flock  [phone]   Residential & Outpatient Substance Abuse Program  [phone]   Psychological Services Organization         Address  Phone  Notes  [facility] Behavioral Health  [phone]   [facility]  [phone]   [facility] Mental Health [address] [place] [phone] or [phone]    Mobile Crisis Teams Organization         Address  Phone  Notes  [facility], Mobile Crisis Care Unit  [phone]   [facility]  [address] [place], [place] [phone]  [facility] [address] [place] [phone]    Self-Help/Support Groups Organization         Address  Phone             Notes  [facility] [place] - variety of support groups  336- I7437963 Call for more information  Narcotics Anonymous (NA), [facility] [address], [employer] [place]  2 meetings at this location   Statistician         Address  Phone  Notes  [facility] [address],    [place]  [phone]   [facility]  [address] [place] [address], [place], [place] [phone]   [facility] [address] [place], [place] [phone] Admissions: 8am-3pm M-F  [facility] [address]    [place], [place] [phone]   [facility] [address] [place], [place], [place] [phone]   [facility] [address]  [place], [place] [phone]   [facility] - Intensive Outpatient [address], [place], [place] [phone]   [facility] ([facility] [address] [place].,  [place], [place] [phone] or [phone]   [facility] ([facility]) [address] [place], [place] [phone] Accepts Medicaid  [facility] [address]  [place] [phone] Substance Abuse/Addiction Treatment   [facility] Organization         Address  Phone  Notes  [facility]  [phone]   Angie Fava, PhD [address]vin Knack [place], [place]   [phone] or [phone]   [facility] Behavioral   [address] [place], [place] [phone]   [facility] [address] [place], [place] [phone] Insurance/Medicaid/sponsorship through [facility] [address]                                    [place], [place] [phone] Therapy/tele-psych/case  [facility] [address], [place] [phone]    Dr. Lolly Mustache  [phone]   [facility]  [employer] [facility] Dept. 1) [address]  2) [address], [place] 3)  [address], [place] [phone]  [phone]   [facility] Child Abuse Hotline (571)626-9597 or [phone] (After Hours)

## 2014-07-19 NOTE — ED Notes (Signed)
[facility] paged.

## 2014-08-24 ENCOUNTER — Ambulatory Visit: Payer: Medicare Other | Attending: Psychiatry

## 2014-08-24 DIAGNOSIS — R262 Difficulty in walking, not elsewhere classified: Secondary | ICD-10-CM | POA: Diagnosis not present

## 2014-08-24 DIAGNOSIS — R279 Unspecified lack of coordination: Secondary | ICD-10-CM

## 2014-08-24 NOTE — Therapy (Signed)
[facility] [address] [place], [place], [address] Phone: [phone]   Fax:  [phone]  Physical Therapy Evaluation  Patient Details  Name: Sharon Jenkins MRN: [id number] Date of Birth: [birth year] Referring Provider:  Dorisann Frames, MD  Encounter Date: 08/24/2014      PT End of Session - 08/24/14 1330    Visit Number 1   Number of Visits 17   Date for PT Re-Evaluation 10/23/14   Authorization Type Medicare- G codes required   PT Start Time 1015   PT Stop Time 1104   PT Time Calculation (min) 49 min      Past Medical History  Diagnosis Date  . Spinal stenosis of lumbar region   . Parkinson disease     Past Surgical History  Procedure Laterality Date  . Lumbar disc arthroplasty    . Hysterotomy      There were no vitals taken for this visit.  Visit Diagnosis:  Lack of coordination - Plan: PT PLAN OF CARE CERT/RE-CERT  Difficulty walking - Plan: PT PLAN OF CARE CERT/RE-CERT      Subjective Assessment - 08/24/14 1032    Symptoms Pt was diagnosed with Parkinson's Disease in 2009 however she was able to remain quite active-participating in half marathons and triathlons and training runners until 2 yeras ago. She lost her husband to cancer in April 2014  which led to increaed stress causing a functional decline. In addition she had Shingles, and more recently moved away from her home in [place] (which is where she has lived her whole life) this has further led to increased stress and increased Parkinsonian symptoms.    Patient Stated Goals Walk independently, to increase her strength   Currently in Pain? Yes   Pain Score 2    Pain Location --  Right sided hip bursa   Pain Type Chronic pain  will monitor but will not directly address          [facility] PT Assessment - 08/24/14 0001    Assessment   Medical Diagnosis Parkinson's   Onset Date --  2009   Precautions   Precautions Fall   Balance Screen    Has the patient fallen in the past 6 months Yes  got tangled in pet leashes   How many times? 1   Has the patient had a decrease in activity level because of a fear of falling?  Yes   Is the patient reluctant to leave their home because of a fear of falling?  No   Home Environment   Living Enviornment Private residence   Living Arrangements Alone   Available Help at Discharge Family   Type of Home House   Home Access Stairs to enter   Entrance Stairs-Number of Steps 6   Entrance Stairs-Rails Can reach both   Home Layout Two level   Alternate Level Stairs-Number of Steps 14   Alternate Level Stairs-Rails Right   Home Equipment Walker - 4 wheels;Shower seat - built in  and 3 wheel walker   Prior Function   Level of Independence Independent with homemaking with ambulation;Independent with basic ADLs;Independent with gait;Independent with transfers  Was independent prior to 2 years ago   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   Leisure cycling, triathlons, playing with grandchildren   Cognition   Overall Cognitive Status Within Functional Limits for tasks assessed   Observation/Other Assessments   Focus on Therapeutic Outcomes (FOTO)  Functional Status  48   Posture/Postural Control   Posture/Postural Control Postural limitations   Postural Limitations Rounded Shoulders;Forward head;Increased thoracic kyphosis;Flexed trunk  lateral thoracic flexion left   Transfers   Transfers Sit to Stand;Stand to Sit  independent   Ambulation/Gait   Ambulation/Gait Yes   Ambulation/Gait Assistance 5: Supervision;6: Modified independent (Device/Increase time)  supervision without walker   Ambulation/Gait Assistance Details Pt ambulated without 3-wheeled walker for testing today. Typically uses 3 wheeled walker MOD I.   Ambulation Distance (Feet) 200 Feet   Assistive device --  3 wheeled walker   Gait Pattern Trendelenburg;Lateral trunk lean to left;Trunk flexed   Ambulation Surface  Indoor;Level   Gait velocity 3.50   Gait velocity - backwards 2.86   Stairs Yes   Stairs Assistance 7: Independent   Stair Management Technique No rails   Number of Stairs 4   Standardized Balance Assessment   Standardized Balance Assessment Timed Up and Go Test   Timed Up and Go Test   TUG Normal TUG;Manual TUG;Cognitive TUG   Normal TUG (seconds) 7.01   Manual TUG (seconds) 8.13  spilled water while turning to sit   Cognitive TUG (seconds) 7.13   Functional Gait  Assessment   Gait assessed  Yes   Gait Level Surface Walks 20 ft in less than 7 sec but greater than 5.5 sec, uses assistive device, slower speed, mild gait deviations, or deviates 6-10 in outside of the 12 in walkway width.   Change in Gait Speed Able to smoothly change walking speed without loss of balance or gait deviation. Deviate no more than 6 in outside of the 12 in walkway width.   Gait with Horizontal Head Turns Performs head turns smoothly with slight change in gait velocity (eg, minor disruption to smooth gait path), deviates 6-10 in outside 12 in walkway width, or uses an assistive device.   Gait with Vertical Head Turns Performs task with slight change in gait velocity (eg, minor disruption to smooth gait path), deviates 6 - 10 in outside 12 in walkway width or uses assistive device   Gait and Pivot Turn Pivot turns safely in greater than 3 sec and stops with no loss of balance, or pivot turns safely within 3 sec and stops with mild imbalance, requires small steps to catch balance.   Step Over Obstacle Is able to step over 2 stacked shoe boxes taped together (9 in total height) without changing gait speed. No evidence of imbalance.   Gait with Narrow Base of Support Ambulates 4-7 steps.   Gait with Eyes Closed Walks 20 ft, uses assistive device, slower speed, mild gait deviations, deviates 6-10 in outside 12 in walkway width. Ambulates 20 ft in less than 9 sec but greater than 7 sec.   Ambulating Backwards Walks 20  ft, no assistive devices, good speed, no evidence for imbalance, normal gait   Steps Alternating feet, no rail.   Total Score 23                            PT Short Term Goals - 08/24/14 1338    PT SHORT TERM GOAL #1   Title Demonstrate independence with HEP. Target 09/23/14   PT SHORT TERM GOAL #2   Title Increase FGA score to 26/30 for improving dynamic balance.  Target 09/23/14   PT SHORT TERM GOAL #3   Title Demonstrate ability to perform Manual TUG in <13.5 seconds without spilling water for improved coordination  and multi tasking. Target 09/23/14   PT SHORT TERM GOAL #4   Title Ambulate independently on level surfaces 500' without assistive device.  Target 09/23/14           PT Long Term Goals - 08/24/14 1341    PT LONG TERM GOAL #1   Title Verbalize understanding of Parkinson's specific community fitness opportunities. Target 10/23/14   PT LONG TERM GOAL #2   Title Increase FGA score to 29/30 for improved dynamic balance. Target 10/23/14   PT LONG TERM GOAL #3   Title Demonstrate ability to ambulate independently indoors with upright posture x500' for decreased risk of back pain and for decreased risk of freezing episodes. Target 10/23/14   PT LONG TERM GOAL #4   Title Ambulate 1000' on outdoor level and unleve surfaces without assistive device with supervision for increasing independence with community ambulation. Target 10/23/14   PT LONG TERM GOAL #5   Title Complete TUG manual in 8.13 seconds without spilling. Target 10/23/14               Plan - 08/24/14 1331    Clinical Impression Statement Considering the length of time that this pt has had Parkinson's Disease, she is more mobile than would be expected. This is likely, in part, due to her high level of physical activity for the first few years of the diagnosis. Since recent stressful life events she has had a decline in funcition and is now reliant on a 3 wheeled walker for all ambulation outside of her  home. Her posture has been significantly affected, and pt reports that when she fatigues, her posture becomes so poor that she can hardly stand or walk.    Pt will benefit from skilled therapeutic intervention in order to improve on the following deficits Abnormal gait;Decreased coordination;Decreased activity tolerance;Decreased strength;Postural dysfunction;Decreased balance;Difficulty walking  Will address bursitis pain if it begins to limit participation in therapy   Rehab Potential Good   Clinical Impairments Affecting Rehab Potential highly motivated, enjoys exercise   PT Frequency 2x / week   PT Duration 8 weeks   PT Treatment/Interventions ADLs/Self Care Home Management;Gait training;Therapeutic exercise;Patient/family education;Balance training;Neuromuscular re-education;Functional mobility training;Manual techniques;Therapeutic activities   PT Next Visit Plan Provide HEP for strengthening of postural muscles, PWR standing exercises, tandem walking, UE tasks while ambulating   Recommended Other Services may recommend OT. Will discuss with pt   Consulted and Agree with Plan of Care Patient          G-Codes - 08/24/14 1345    Functional Assessment Tool Used FGA 23/30   Functional Limitation Mobility: Walking and moving around   Mobility: Walking and Moving Around Current Status [phone](G8978) At least 20 percent but less than 40 percent impaired, limited or restricted   Mobility: Walking and Moving Around Goal Status [phone](G8979) At least 1 percent but less than 20 percent impaired, limited or restricted       Problem List There are no active problems to display for this patient.  Lamar LaundryJennifer Vallerie Hentz, PT,DPT,NCS 08/24/2014 1:49 PM Phone [phone] FAX [phone]         [facility] [address] [place], [place], [address] Phone: [phone]   Fax:  [phone]

## 2014-08-29 ENCOUNTER — Ambulatory Visit: Payer: Medicare Other

## 2014-08-29 DIAGNOSIS — R279 Unspecified lack of coordination: Secondary | ICD-10-CM | POA: Diagnosis not present

## 2014-08-29 DIAGNOSIS — R262 Difficulty in walking, not elsewhere classified: Secondary | ICD-10-CM

## 2014-08-29 NOTE — Patient Instructions (Signed)
HIP: Hamstrings - Short Sitting   Scoot to edge of chair. Extend the right leg out in front of you and pull foot back toward. Keep knee straight. Lift chest and lean forward leading with your chest.  Hold 30 seconds. Perform 3x. Do this in the morning and at night. Recommend performing on both legs.   Copyright  VHI. All rights reserved.    Hip Abductors (Side Lying)   Lie on side with bottom leg bent at knee, 2-3 pound weight on right leg and left leg. Turn leg inward so the heel is toward the ceiling. Lift leg up. Keep upper hip rolled forward (past the lower hip) and knee straight. Hold 1 seconds. Repeat 10 times. Do 3 sets per day. CAUTION: Move slowly.  Copyright  VHI. All rights reserved.    http://ss.exer.us/75   Copyright  VHI. All rights reserved.    IT Band: Wall Lean With Crossed Leg   Stand with right hand on wall (or hold onto a chair). Cross right leg behind other leg. Stretch right hip toward wall with other arm supporting trunk or holding chair. Hold 30 seconds. Relax. Repeat 3 times each leg. Do 2 sets per  Day. (morning and night) Repeat on other side.  Copyright  VHI. All rights reserved.   Hip Flexor Stretch   Lying on back near edge of bed, bend one leg, foot flat. Hang other leg over edge, relaxed, bend knee until a gentle stretch is felt. Keep foot supported with a stack of books or the floor. Hold 30 seconds.  Repeat 3 times on each leg. Do 2 sessions per day (morning and night). Advanced Exercise: Bend knee back keeping thigh in contact with bed.  http://gt2.exer.us/347   Copyright  VHI. All rights reserved.  Gluteal Stretch--don't use a ball    Don't need to use ball. Lie supine, foot flat on bed and, other ankle crossed over knee. Push your knee away from your body until a gentle stretch is felt. Peform 3x30 seconds on each leg morning and night. Copyright  VHI. All rights reserved.   Tandem Walking   Walk with each foot directly in  front of other, heel of one foot touching toes of other foot with each step. Both feet straight ahead. When you get to the end, walk in the same manner but backwards.   Axial Extension   Lie on stomach with forehead resting on floor and arms at sides. Tuck chin in and raise head from floor without bending it up or down. LIft head and shoulders. Hold 2-5 seconds. Repeat 10 times per set. Do 2 sets per session. Do 1 sessions per day.  http://orth.exer.us/971   Copyright  VHI. All rights reserved.     Copyright  VHI. All rights reserved.

## 2014-08-29 NOTE — Therapy (Signed)
[facility] [address] [place], [place], [address] Phone: [phone]   Fax:  [phone]  Physical Therapy Treatment  Patient Details  Name: Sharon Jenkins MRN: [id number] Date of Birth: Nov 05, 1950 Referring Provider:  Dorisann FramesHaq, Ihtsham U, MD  Encounter Date: 08/29/2014      PT End of Session - 08/29/14 1158    Visit Number 2   Number of Visits 17   Date for PT Re-Evaluation 10/23/14   Authorization Type Medicare- G codes required   PT Start Time 1103   PT Stop Time 1150   PT Time Calculation (min) 47 min      Past Medical History  Diagnosis Date  . Spinal stenosis of lumbar region   . Parkinson disease     Past Surgical History  Procedure Laterality Date  . Lumbar disc arthroplasty    . Hysterotomy      There were no vitals filed for this visit.  Visit Diagnosis:  Difficulty walking      Subjective Assessment - 08/29/14 1107    Symptoms Pt found an article online related parkinson's disease and posture impairment and how this affects breathing. She was very encouraged that this is something we can work on.   Currently in Pain? Yes   Pain Score 5    Pain Location Hip   Pain Orientation Right  Lateral hip pain in region of IT band.      Pt was taught, performed and was provided with typed instructions for HEP. See pt instructions for these exercises performed today.  Also performed seated spinal extension and progressed this to standing spinal extension at door frame x30 seconds.   Extra time spend explaining the purpose of each exercise and re-directing pt's attention to the exercise at hand as she enjoys demonstrating other exercises she's already performing at home.                          PT Education - 08/29/14 1158    Education provided Yes   Education Details HEP-exercises for IT band/bursitis; tandem walking, axial extension for posture   Person(s) Educated Patient   Methods Explanation;Demonstration;Handout   Comprehension Verbalized understanding;Returned demonstration          PT Short Term Goals - 08/24/14 1338    PT SHORT TERM GOAL #1   Title Demonstrate independence with HEP. Target 09/23/14   PT SHORT TERM GOAL #2   Title Increase FGA score to 26/30 for improving dynamic balance.  Target 09/23/14   PT SHORT TERM GOAL #3   Title Demonstrate ability to perform Manual TUG in <13.5 seconds without spilling water for improved coordination and multi tasking. Target 09/23/14   PT SHORT TERM GOAL #4   Title Ambulate independently on level surfaces 500' without assistive device.  Target 09/23/14           PT Long Term Goals - 08/24/14 1341    PT LONG TERM GOAL #1   Title Verbalize understanding of Parkinson's specific community fitness opportunities. Target 10/23/14   PT LONG TERM GOAL #2   Title Increase FGA score to 29/30 for improved dynamic balance. Target 10/23/14   PT LONG TERM GOAL #3   Title Demonstrate ability to ambulate independently indoors with upright posture x500' for decreased risk of back pain and for decreased risk of freezing episodes. Target 10/23/14   PT LONG TERM GOAL #4   Title Ambulate 1000' on outdoor level  and unleve surfaces without assistive device with supervision for increasing independence with community ambulation. Target 10/23/14   PT LONG TERM GOAL #5   Title Complete TUG manual in 8.13 seconds without spilling. Target 10/23/14               Plan - 08/29/14 1200    Clinical Impression Statement Pt very aware of parkinson related deficits including posture impairment and gait impairment and the effect these have on balance and respiration. Pt is also very aware of her IT band pain and has had therapy for this in the past. Pt's IT band pain is affecting her posture and gait. Workd on stretching and strengthening to address this today.  Recommend adding parkinson's specific PWR! exercises next visit.    PT Next Visit Plan  add PWR! standing exercises to HEP, review tandem walking from current HEP        Problem List There are no active problems to display for this patient.  Lamar Laundry, PT,DPT,NCS 08/29/2014 12:08 PM Phone [phone] FAX [phone]         [facility] [address] [place], [place], [address] Phone: [phone]   Fax:  [phone]

## 2014-08-31 ENCOUNTER — Ambulatory Visit: Payer: Medicare Other | Admitting: Physical Therapy

## 2014-09-01 ENCOUNTER — Ambulatory Visit: Payer: Medicare Other

## 2014-09-01 DIAGNOSIS — R279 Unspecified lack of coordination: Secondary | ICD-10-CM

## 2014-09-01 DIAGNOSIS — R262 Difficulty in walking, not elsewhere classified: Secondary | ICD-10-CM

## 2014-09-01 NOTE — Therapy (Signed)
[facility] [address] [place], [place], [address] Phone: [phone]   Fax:  [phone]  Physical Therapy Treatment  Patient Details  Name: Sharon Jenkins MRN: [id number] Date of Birth: 29-Jul-1950 Referring Provider:  Dorisann FramesHaq, Ihtsham U, MD  Encounter Date: 09/01/2014      PT End of Session - 09/01/14 1316    Visit Number 3   Number of Visits 17   Date for PT Re-Evaluation 10/23/14   Authorization Type Medicare- G codes required   PT Start Time 0850   PT Stop Time 0930   PT Time Calculation (min) 40 min      Past Medical History  Diagnosis Date  . Spinal stenosis of lumbar region   . Parkinson disease     Past Surgical History  Procedure Laterality Date  . Lumbar disc arthroplasty    . Hysterotomy      There were no vitals filed for this visit.  Visit Diagnosis:  Lack of coordination  Difficulty walking      Subjective Assessment - 09/01/14 0855    Symptoms We are going to work on posture today, right? I'm feeling a little weak today.       Self care: I looked at pt's rollator and determined that it was the proper height for pt. Educated pt on how to determine proper height of rollator.   Also educated pt on how to perform exercises without thoracic rotation to decrease her concerns regarding spinal stenosis.   Therex: Pt's forward head and throacic flexion posture impedes ambulation ability. Reviewed doorframe postural stretch. Pt performed 3x but had difficulty coordinating it. To improve performance of this stretch, a mirror was used, and tactile and verbal cues provided to decrease forward head while performing the posture stretch. Pt was then instructed to perform it without the overhead reach--with arms by her side. Verbal cues required for knee extension;.   Neuro Re-ed--exercises for coordination and posture Pt was taught and performed PWR! Standing basic 4 exercises: PWR! UP for  improving posture, PWR rock with single UE on countertop and verbal cues and demonstration to improve lateral weight shift, PWR! Twist with thorough education regarding how to maintain alignment of spine while shifting weight at feet and keeping pelvis and shoulders aligned. PWR! Step also performed. Pt required mirroring and verbal cues provided by therapist for correct performance.  *frequent rest breaks required today                           PT Education - 09/01/14 1315    Education provided Yes   Education Details PWR standing   Person(s) Educated Patient   Methods Explanation;Demonstration;Handout;Verbal cues   Comprehension Verbalized understanding;Returned demonstration          PT Short Term Goals - 08/24/14 1338    PT SHORT TERM GOAL #1   Title Demonstrate independence with HEP. Target 09/23/14   PT SHORT TERM GOAL #2   Title Increase FGA score to 26/30 for improving dynamic balance.  Target 09/23/14   PT SHORT TERM GOAL #3   Title Demonstrate ability to perform Manual TUG in <13.5 seconds without spilling water for improved coordination and multi tasking. Target 09/23/14   PT SHORT TERM GOAL #4   Title Ambulate independently on level surfaces 500' without assistive device.  Target 09/23/14           PT Long Term Goals - 08/24/14 1341  PT LONG TERM GOAL #1   Title Verbalize understanding of Parkinson's specific community fitness opportunities. Target 10/23/14   PT LONG TERM GOAL #2   Title Increase FGA score to 29/30 for improved dynamic balance. Target 10/23/14   PT LONG TERM GOAL #3   Title Demonstrate ability to ambulate independently indoors with upright posture x500' for decreased risk of back pain and for decreased risk of freezing episodes. Target 10/23/14   PT LONG TERM GOAL #4   Title Ambulate 1000' on outdoor level and unleve surfaces without assistive device with supervision for increasing independence with community ambulation. Target 10/23/14    PT LONG TERM GOAL #5   Title Complete TUG manual in 8.13 seconds without spilling. Target 10/23/14               Plan - 09/01/14 1316    Clinical Impression Statement Pt required frequent rest breaks today due to increased fatigue as she reported she performed a lot of exercise prior to session today. Continue per plan of care.   PT Next Visit Plan PWR prone exercises   Consulted and Agree with Plan of Care Patient        Problem List There are no active problems to display for this patient.  Lamar Laundry, PT,DPT,NCS 09/01/2014 1:33 PM Phone [phone] FAX [phone]         [facility] [address] [place], [place], [address] Phone: [phone]   Fax:  [phone]

## 2014-09-07 ENCOUNTER — Ambulatory Visit: Payer: Medicare Other | Admitting: Physical Therapy

## 2014-09-07 DIAGNOSIS — R279 Unspecified lack of coordination: Secondary | ICD-10-CM

## 2014-09-07 DIAGNOSIS — R262 Difficulty in walking, not elsewhere classified: Secondary | ICD-10-CM

## 2014-09-07 NOTE — Patient Instructions (Signed)
Provided written handout for modified quadruped position PWR! Up x 10, PWR! Rock x 10, [place] Twist x 10 with cues for paying attention to activation of quads, gluts, abdominals and scapular squeezes; cues also for pivot through lower extremities to avoid excessive trunk rotation.

## 2014-09-07 NOTE — Therapy (Signed)
[facility] [address] [place], [place], [address] Phone: [phone]   Fax:  [phone]  Physical Therapy Treatment  Patient Details  Name: Sharon Jenkins H Goethe MRN: [id number] Date of Birth: Jul 20, 1950 Referring Provider:  Dorisann FramesHaq, Ihtsham U, MD  Encounter Date: 09/07/2014      PT End of Session - 09/07/14 1113    Visit Number 4   Number of Visits 17   Date for PT Re-Evaluation 10/23/14   Authorization Type Medicare- G codes required   PT Start Time 1022   PT Stop Time 1102   PT Time Calculation (min) 40 min   Activity Tolerance Patient tolerated treatment well      Past Medical History  Diagnosis Date  . Spinal stenosis of lumbar region   . Parkinson disease     Past Surgical History  Procedure Laterality Date  . Lumbar disc arthroplasty    . Hysterotomy      There were no vitals filed for this visit.  Visit Diagnosis:  Lack of coordination  Difficulty walking      Subjective Assessment - 09/07/14 1023    Symptoms Doing okay-no changes.   I am a little disappointed about not being able to drive yet.   Currently in Pain? No/denies                 Gait:  Gait activities using 3-wheeled RW 120 ft x 2, then 345 ft with supervision, with cues for abdominal, glut, quad activation and scapular squeezes for upright posture.  Also provided cues for increased step length.  Pt has 2 episodes of decreased foot clearance with gait.  Gait x 100 ft without device with supervision, with cues to stop and reset posture.  PT questions patient regarding low height of 3-wheeled RW, possibly needing to raise height; however, pt reports when she raises walker height, she hikes at her shoulders.  Encouraged patient to have light support of UE on walker to avoid excessive trunk flexion/forward lean.           PWR [facility] - 09/07/14 1025    PWR! exercises Moves in Banksquadraped;Moves in sitting;Moves in standing   PWR!  Up 10   PWR! Rock 10   PWR! Twist 10   Comments Performed modified at counter with cues for large amplitude movement patterns   PWR! Up 10   PWR! Rock 10   PWR! Twist 10   PWR Step 10   Comments Cues for large amplitude movement pattern      PWR! Up for posture, PWR! Rock for [employer]weightshift, PWR! Twist for trunk rotation, PWR! Step for improved initial step length.  PWR! Up also performed in sitting x 10 reps.  PT provides cues for large amplitude movement patterns, and for appropriate weightshift and pivot for maintaining neutral spine during trunk rotation activities.         PT Education - 09/07/14 1112    Education provided Yes   Education Details PWR! Moves in modified quadruped at counter; use of abdominal and glut/quad activation to assist with posture  with gait   Person(s) Educated Patient   Methods Explanation;Demonstration;Handout;Verbal cues   Comprehension Verbalized understanding;Returned demonstration          PT Short Term Goals - 08/24/14 1338    PT SHORT TERM GOAL #1   Title Demonstrate independence with HEP. Target 09/23/14   PT SHORT TERM GOAL #2   Title Increase FGA score to 26/30 for improving dynamic  balance.  Target 09/23/14   PT SHORT TERM GOAL #3   Title Demonstrate ability to perform Manual TUG in <13.5 seconds without spilling water for improved coordination and multi tasking. Target 09/23/14   PT SHORT TERM GOAL #4   Title Ambulate independently on level surfaces 500' without assistive device.  Target 09/23/14           PT Long Term Goals - 08/24/14 1341    PT LONG TERM GOAL #1   Title Verbalize understanding of Parkinson's specific community fitness opportunities. Target 10/23/14   PT LONG TERM GOAL #2   Title Increase FGA score to 29/30 for improved dynamic balance. Target 10/23/14   PT LONG TERM GOAL #3   Title Demonstrate ability to ambulate independently indoors with upright posture x500' for decreased risk of back pain and for decreased risk of  freezing episodes. Target 10/23/14   PT LONG TERM GOAL #4   Title Ambulate 1000' on outdoor level and unleve surfaces without assistive device with supervision for increasing independence with community ambulation. Target 10/23/14   PT LONG TERM GOAL #5   Title Complete TUG manual in 8.13 seconds without spilling. Target 10/23/14               Plan - 09/07/14 1113    Clinical Impression Statement Pt able to sustain upright posture better with cues for abdominal, quad and glut activation with standing and with gait.  Pt reports doing some similar poses with yoga; however, PT discusses differences of PWR!, including focus on activation, large amplitude movement patterns.  Pt will continue to benefit from further skilled PT to address posture, balance, and gait.   Pt will benefit from skilled therapeutic intervention in order to improve on the following deficits Abnormal gait;Decreased coordination;Decreased activity tolerance;Decreased strength;Postural dysfunction;Decreased balance;Difficulty walking   Rehab Potential Good   PT Frequency 2x / week   PT Duration 8 weeks   PT Treatment/Interventions ADLs/Self Care Home Management;Gait training;Therapeutic exercise;Patient/family education;Balance training;Neuromuscular re-education;Functional mobility training;Manual techniques;Therapeutic activities   PT Next Visit Plan Review appropriate PWR! Moves, especially ones focused on posture; standing balance/gait activities   Consulted and Agree with Plan of Care Patient        Problem List There are no active problems to display for this patient.   Mizuki Hoel W. 09/07/2014, 11:20 AM  Lonia Blood, PT 09/07/2014 11:25 AM Phone: [phone] Fax: [phone]   [facility] [address] [place], [place], [address] Phone: [phone]   Fax:  [phone]

## 2014-09-08 ENCOUNTER — Ambulatory Visit: Payer: Medicare Other | Admitting: Physical Therapy

## 2014-09-14 ENCOUNTER — Ambulatory Visit: Payer: Medicare Other

## 2014-09-16 ENCOUNTER — Ambulatory Visit: Payer: Medicare Other | Admitting: Physical Therapy

## 2014-09-20 ENCOUNTER — Ambulatory Visit: Payer: Medicare Other | Admitting: Physical Therapy

## 2014-09-22 ENCOUNTER — Ambulatory Visit: Payer: Medicare Other | Attending: Psychiatry

## 2014-09-22 DIAGNOSIS — R279 Unspecified lack of coordination: Secondary | ICD-10-CM | POA: Diagnosis not present

## 2014-09-22 DIAGNOSIS — R262 Difficulty in walking, not elsewhere classified: Secondary | ICD-10-CM | POA: Insufficient documentation

## 2014-09-22 NOTE — Therapy (Signed)
[facility] [address] [place], [place], [address] Phone: [phone]   Fax:  [phone]  Physical Therapy Treatment  Patient Details  Name: MEOSHIA BILLING MRN: [id number] Date of Birth: 12-06-1950 Referring Provider:  Dorisann Frames, MD  Encounter Date: 09/22/2014      PT End of Session - 09/22/14 1259    Visit Number 5   Number of Visits 17   Date for PT Re-Evaluation 10/23/14   Authorization Type Medicare- G codes required   PT Start Time 0936   PT Stop Time 1016   PT Time Calculation (min) 40 min      Past Medical History  Diagnosis Date  . Spinal stenosis of lumbar region   . Parkinson disease     Past Surgical History  Procedure Laterality Date  . Lumbar disc arthroplasty    . Hysterotomy      There were no vitals filed for this visit.  Visit Diagnosis:  Lack of coordination  Difficulty walking      Subjective Assessment - 09/22/14 0938    Subjective Had to cancel a number of sessions due to traveling   Currently in Pain? No/denies        PWR! Basic 4 Standing: with mirror for posture correction 10x PWR! Up--tactile cues for posture 10x PWR Rock 10X PWR Twist 10x PWR transition-tactile cues for posture   PWR! Basic 4 High floor all 4's modified to standing at counter 10x PWR! Up verbal cues for correct form (buttocks back) 10x PWR Rock 10X PWR Twist verbal cues for increased amplitude 10x PWR transition  Over the head ball toss for posture and balance without support, using BUE to toss/catch ball  Therex: 3 minutes standing at door frame stretch  Bridging with physioball spine support Spinal extension stretch in sitting against physioball 2x2 minutes Spinal extensor strengthening 2x10 repetitions in sitting against physioball with tactile and verbal cues to promote increased intensity.                          PT Short Term Goals - 08/24/14 1338    PT  SHORT TERM GOAL #1   Title Demonstrate independence with HEP. Target 09/23/14   PT SHORT TERM GOAL #2   Title Increase FGA score to 26/30 for improving dynamic balance.  Target 09/23/14   PT SHORT TERM GOAL #3   Title Demonstrate ability to perform Manual TUG in <13.5 seconds without spilling water for improved coordination and multi tasking. Target 09/23/14   PT SHORT TERM GOAL #4   Title Ambulate independently on level surfaces 500' without assistive device.  Target 09/23/14           PT Long Term Goals - 08/24/14 1341    PT LONG TERM GOAL #1   Title Verbalize understanding of Parkinson's specific community fitness opportunities. Target 10/23/14   PT LONG TERM GOAL #2   Title Increase FGA score to 29/30 for improved dynamic balance. Target 10/23/14   PT LONG TERM GOAL #3   Title Demonstrate ability to ambulate independently indoors with upright posture x500' for decreased risk of back pain and for decreased risk of freezing episodes. Target 10/23/14   PT LONG TERM GOAL #4   Title Ambulate 1000' on outdoor level and unleve surfaces without assistive device with supervision for increasing independence with community ambulation. Target 10/23/14   PT LONG TERM GOAL #5   Title Complete TUG manual in  8.13 seconds without spilling. Target 10/23/14               Plan - 09/22/14 1300    Clinical Impression Statement Pt demonstrated improved coordination with PWR! exercises today. Pt will continue to benefit from skilled PT to work on posture, balance and gait.   PT Next Visit Plan *Check short term goals* Continue working on posture and balance        Problem List There are no active problems to display for this patient.  Lamar LaundryJennifer Hannia Matchett, PT,DPT,NCS 09/22/2014 1:02 PM Phone [phone] FAX [phone]         [facility] [address] [place], [place], [address] Phone: [phone]   Fax:  [phone]

## 2014-09-28 ENCOUNTER — Ambulatory Visit: Payer: Medicare Other | Admitting: Physical Therapy

## 2014-09-28 DIAGNOSIS — R262 Difficulty in walking, not elsewhere classified: Secondary | ICD-10-CM

## 2014-09-28 DIAGNOSIS — R293 Abnormal posture: Secondary | ICD-10-CM

## 2014-09-28 DIAGNOSIS — R279 Unspecified lack of coordination: Secondary | ICD-10-CM | POA: Diagnosis not present

## 2014-09-28 NOTE — Patient Instructions (Signed)
Provided patient with written handouts: -LSVT Based Rock and Reach x 10 reps, 1-2 times per day  -PWR! Moves Seated PWR! Up x 20 reps.  Educated pt in utilizing exercises as STRATEGY for postural changes to activate extensor muscles and improve posture versus static stretching at doorframe with UEs in excessive external rotation to hold doorframe for safety.

## 2014-09-28 NOTE — Therapy (Signed)
[facility] [address] [place], [place], [address] Phone: [phone]   Fax:  [phone]  Physical Therapy Treatment  Patient Details  Name: Sharon Jenkins MRN: [id number] Date of Birth: 10/07/1950 Referring Provider:  Ricki Rodriguez, MD  Encounter Date: 09/28/2014      PT End of Session - 09/28/14 1121    Visit Number 6   Number of Visits 17   Date for PT Re-Evaluation 10/23/14   Authorization Type Medicare- G codes required   PT Start Time 1019   PT Stop Time 1104   PT Time Calculation (min) 45 min   Activity Tolerance Patient tolerated treatment well   Behavior During Therapy [facility] for tasks assessed/performed      Past Medical History  Diagnosis Date  . Spinal stenosis of lumbar region   . Parkinson disease     Past Surgical History  Procedure Laterality Date  . Lumbar disc arthroplasty    . Hysterotomy      There were no vitals filed for this visit.  Visit Diagnosis:  Posture abnormality  Difficulty walking      Subjective Assessment - 09/28/14 1022    Subjective No pain, medication changed yesterday; now taking 1.5 tablets every 3.5 hours; had a great day yesterday but didn't sleep well last night.   Currently in Pain? No/denies            [facility] PT Assessment - 09/28/14 1033    Functional Gait  Assessment   Gait assessed  Yes   Gait Level Surface Walks 20 ft, slow speed, abnormal gait pattern, evidence for imbalance or deviates 10-15 in outside of the 12 in walkway width. Requires more than 7 sec to ambulate 20 ft.   Change in Gait Speed Able to smoothly change walking speed without loss of balance or gait deviation. Deviate no more than 6 in outside of the 12 in walkway width.   Gait with Horizontal Head Turns Performs head turns smoothly with slight change in gait velocity (eg, minor disruption to smooth gait path), deviates 6-10 in outside 12 in walkway width, or uses an assistive  device.   Gait with Vertical Head Turns Performs task with slight change in gait velocity (eg, minor disruption to smooth gait path), deviates 6 - 10 in outside 12 in walkway width or uses assistive device   Gait and Pivot Turn Pivot turns safely in greater than 3 sec and stops with no loss of balance, or pivot turns safely within 3 sec and stops with mild imbalance, requires small steps to catch balance.   Step Over Obstacle Is able to step over one shoe box (4.5 in total height) without changing gait speed. No evidence of imbalance.   Gait with Narrow Base of Support Is able to ambulate for 10 steps heel to toe with no staggering.   Gait with Eyes Closed Walks 20 ft, slow speed, abnormal gait pattern, evidence for imbalance, deviates 10-15 in outside 12 in walkway width. Requires more than 9 sec to ambulate 20 ft.   Ambulating Backwards Walks 20 ft, uses assistive device, slower speed, mild gait deviations, deviates 6-10 in outside 12 in walkway width.   Steps Alternating feet, no rail.   Total Score 21      Gait x >500 ft with no device, with cues for upright posture and arm swing.  Pt takes several rest breaks to reset posture and tends to hold hands on thighs during gait, reports  this helps her hold her posture better.  After gait, pt reports she needs to fix her posture and goes to the doorframe, trying to place UEs in external rotation to hold onto doorframe.  Pt does not appear to have the ROM to safely reach this distance with her UEs and it is putting her in an unsafe position for stretching.  Pt appears frustrated because "I could do this stretch fully with no problem this morning"  PT had frank discussion about strategies to improve posture when needed.  Discussed the bradykinesia, rigidity and forward flexed posture does not always respond best to static stretching positions.  Discussed how to use already given PWR! Moves exercises and seated PWR! Up position to activate postural muscles  and offset the forward flexed posture more throughout the day, as a strategy versus just once a day exercise.   Neuro Re-education:  Pt performs HEP of modified quadruped PWR! Moves exercises x 10 reps each; with PWR! Up, PT provides cues for terminal knee extension in standing.  Seated PWR! Up x 10 reps (added to HEP); lateral rocking and reaching x 10 reps (added to HEP), then wide base of support marching for improved activation of lower extremity muscles.  Stagger-stand position forward/back rock and reach x 10 reps, then stagger stance march x 10 reps each position with UE support for improved activation of lower extremity muscles.                     PT Education - 09/28/14 1118    Education provided Yes   Education Details Added to [employer] and reach, [employer]! Moves seated PWR! Up; educated in use of PWR! Up in various positions/varied times throughout the day to activate postural muscles for imrpove posture, to break out of rigidity   Person(s) Educated Patient   Methods Explanation;Demonstration;Verbal cues;Handout   Comprehension Verbalized understanding;Returned demonstration;Need further instruction          PT Short Term Goals - 09/28/14 1024    PT SHORT TERM GOAL #1   Title Demonstrate independence with HEP. Target 09/23/14   Status Achieved   PT SHORT TERM GOAL #2   Title Increase FGA score to 26/30 for improving dynamic balance.  Target 09/23/14   Baseline 09/28/14-Functional Gait assessment score 21/30 (decr from 23/30)   Status Not Met   PT SHORT TERM GOAL #3   Title Demonstrate ability to perform Manual TUG in <13.5 seconds without spilling water for improved coordination and multi tasking. Target 09/23/14   Baseline 09/28/14-14.75 sec   Status Not Met   PT SHORT TERM GOAL #4   Title Ambulate independently on level surfaces 500' without assistive device.  Target 09/23/14   Baseline 09/28/14-forward flexed posture, with pt holding hands on thighs to try to keep  posture more upright   Status Achieved           PT Long Term Goals - 08/24/14 1341    PT LONG TERM GOAL #1   Title Verbalize understanding of Parkinson's specific community fitness opportunities. Target 10/23/14   PT LONG TERM GOAL #2   Title Increase FGA score to 29/30 for improved dynamic balance. Target 10/23/14   PT LONG TERM GOAL #3   Title Demonstrate ability to ambulate independently indoors with upright posture x500' for decreased risk of back pain and for decreased risk of freezing episodes. Target 10/23/14   PT LONG TERM GOAL #4   Title Ambulate 1000' on outdoor level and unleve  surfaces without assistive device with supervision for increasing independence with community ambulation. Target 10/23/14   PT LONG TERM GOAL #5   Title Complete TUG manual in 8.13 seconds without spilling. Target 10/23/14               Plan - 09/28/14 1122    Clinical Impression Statement Pt has met STG #1 and 4.  Pt has not met STG #2 and 3.  Pt feels that her functional mobility fluctuates a great deal from day to day and throughout the day.  Physician had made medication changes and PT has instructed pt in importance of exercises to utlilize as strategies throughout the day for improved posture and functional mobility.  Pt's progress with PT has been limited due to pt cancelling multiple visits due to scheduling conflicts.  Pt will continue to benefit from further skilled PT to address posture, balance, gait activities.   Pt will benefit from skilled therapeutic intervention in order to improve on the following deficits Abnormal gait;Decreased coordination;Decreased activity tolerance;Decreased strength;Postural dysfunction;Decreased balance;Difficulty walking   Rehab Potential Good   PT Frequency 2x / week   PT Duration 8 weeks   PT Treatment/Interventions ADLs/Self Care Home Management;Gait training;Therapeutic exercise;Patient/family education;Balance training;Neuromuscular re-education;Functional  mobility training;Manual techniques;Therapeutic activities   PT Next Visit Plan Continue to work on posture/balance; core stability/back strengthening exercises in quadruped/over therapy ball? trial supine PWR! Moves?   Consulted and Agree with Plan of Care Patient        Problem List There are no active problems to display for this patient.   Dayton Sherr W. 09/28/2014, 11:28 AM  Mady Haagensen, PT 09/28/2014 11:36 AM Phone: [phone] Fax: [facility] [address] [place], [place], [address] Phone: [phone]   Fax:  [phone]

## 2014-09-29 ENCOUNTER — Other Ambulatory Visit: Payer: Self-pay | Admitting: Obstetrics & Gynecology

## 2014-09-29 DIAGNOSIS — N644 Mastodynia: Secondary | ICD-10-CM

## 2014-10-04 ENCOUNTER — Ambulatory Visit: Payer: Medicare Other | Admitting: Physical Therapy

## 2014-10-07 ENCOUNTER — Ambulatory Visit: Payer: Medicare Other | Admitting: Physical Therapy

## 2014-10-12 ENCOUNTER — Other Ambulatory Visit: Payer: Medicare Other

## 2014-10-12 ENCOUNTER — Ambulatory Visit: Payer: Medicare Other

## 2014-10-12 DIAGNOSIS — R279 Unspecified lack of coordination: Secondary | ICD-10-CM

## 2014-10-12 DIAGNOSIS — R262 Difficulty in walking, not elsewhere classified: Secondary | ICD-10-CM

## 2014-10-12 DIAGNOSIS — R293 Abnormal posture: Secondary | ICD-10-CM

## 2014-10-12 NOTE — Therapy (Signed)
[facility] [address] [place], [place], [address] Phone: [phone]   Fax:  [phone]  Physical Therapy Treatment  Patient Details  Name: Sharon Jenkins MRN: [id number] Date of Birth: October 10, 1950 Referring Provider:  Ricki Rodriguez, MD  Encounter Date: 10/12/2014      PT End of Session - 10/12/14 1254    Visit Number 7   Number of Visits 17   Date for PT Re-Evaluation 10/23/14   Authorization Type Medicare- G codes required   PT Start Time 0936   PT Stop Time 1018   PT Time Calculation (min) 42 min      Past Medical History  Diagnosis Date  . Spinal stenosis of lumbar region   . Parkinson disease     Past Surgical History  Procedure Laterality Date  . Lumbar disc arthroplasty    . Hysterotomy      There were no vitals filed for this visit.  Visit Diagnosis:  Posture abnormality  Difficulty walking  Lack of coordination      Subjective Assessment - 10/12/14 0937    Subjective Pt was on vacation for 10 days and reports that she is very tired. She trialed walking poles while at her sister's and felt that they were helpful. She would like to use these today.   Currently in Pain? No/denies     Nordic Poles gait training (x19 minutes) on indoor, level surfaces with emphasis on large amplitude reciprocal arm swing verbal cues to stop and correct sequencing with large amplitude reset.  tall kneeling intermittent UE support on physioball: spinal extension + large amplitude arm  Movement Quadruped with small physioball support under abdomen: 2 sets of 10 of each of the following: alternate leg kickbacks, alternate UE reaches, alternate reciprocal UE/LE kick+reach  Using mirror for visual cue, practiced pressing hip toward the left while pressing shoulder toward the right to assist in stretching and strengthening for posture correction.                            PT Short Term  Goals - 09/28/14 1024    PT SHORT TERM GOAL #1   Title Demonstrate independence with HEP. Target 09/23/14   Status Achieved   PT SHORT TERM GOAL #2   Title Increase FGA score to 26/30 for improving dynamic balance.  Target 09/23/14   Baseline 09/28/14-Functional Gait assessment score 21/30 (decr from 23/30)   Status Not Met   PT SHORT TERM GOAL #3   Title Demonstrate ability to perform Manual TUG in <13.5 seconds without spilling water for improved coordination and multi tasking. Target 09/23/14   Baseline 09/28/14-14.75 sec   Status Not Met   PT SHORT TERM GOAL #4   Title Ambulate independently on level surfaces 500' without assistive device.  Target 09/23/14   Baseline 09/28/14-forward flexed posture, with pt holding hands on thighs to try to keep posture more upright   Status Achieved           PT Long Term Goals - 08/24/14 1341    PT LONG TERM GOAL #1   Title Verbalize understanding of Parkinson's specific community fitness opportunities. Target 10/23/14   PT LONG TERM GOAL #2   Title Increase FGA score to 29/30 for improved dynamic balance. Target 10/23/14   PT LONG TERM GOAL #3   Title Demonstrate ability to ambulate independently indoors with upright posture x500' for decreased risk of back pain  and for decreased risk of freezing episodes. Target 10/23/14   PT LONG TERM GOAL #4   Title Ambulate 1000' on outdoor level and unleve surfaces without assistive device with supervision for increasing independence with community ambulation. Target 10/23/14   PT LONG TERM GOAL #5   Title Complete TUG manual in 8.13 seconds without spilling. Target 10/23/14               Plan - 10/12/14 1255    Clinical Impression Statement Pt required extra time to coordinate use of nordic poles with ambulation, but once she was able to coordinate them, pt demonstrated increased step length and improved reciprocal arm swing however the poles did not assist with improving upright posture. Pt will benefit from  continued therapy to address posture impairment.   PT Next Visit Plan posture, core stability and back exercises; discuss possible d/c next week vs renewal        Problem List There are no active problems to display for this patient.   Delrae Sawyers, PT,DPT,NCS 10/12/2014 1:01 PM Phone [phone] FAX [phone]         [facility] [address] [place], [place], [address] Phone: [phone]   Fax:  [phone]

## 2014-10-14 ENCOUNTER — Other Ambulatory Visit: Payer: Medicare Other

## 2014-10-19 ENCOUNTER — Ambulatory Visit: Payer: Medicare Other | Attending: Psychiatry | Admitting: Physical Therapy

## 2014-10-19 DIAGNOSIS — R293 Abnormal posture: Secondary | ICD-10-CM

## 2014-10-19 DIAGNOSIS — R262 Difficulty in walking, not elsewhere classified: Secondary | ICD-10-CM | POA: Diagnosis not present

## 2014-10-19 DIAGNOSIS — R279 Unspecified lack of coordination: Secondary | ICD-10-CM | POA: Insufficient documentation

## 2014-10-19 NOTE — Therapy (Signed)
[facility] [address] [place], [place], [address] Phone: [phone]   Fax:  [phone]  Physical Therapy Treatment  Patient Details  Name: Sharon Jenkins MRN: [id number] Date of Birth: Mar 08, 1951 Referring Provider:  Ricki Rodriguez, MD  Encounter Date: 10/19/2014      PT End of Session - 10/21/14 0904    Visit Number 8   Number of Visits 17   Date for PT Re-Evaluation 10/23/14   Authorization Type Medicare- G codes required   PT Start Time 1025  Pt arrives late   PT Stop Time 1100   PT Time Calculation (min) 35 min   Activity Tolerance Patient tolerated treatment well   Behavior During Therapy [facility] for tasks assessed/performed      Past Medical History  Diagnosis Date  . Spinal stenosis of lumbar region   . Parkinson disease     Past Surgical History  Procedure Laterality Date  . Lumbar disc arthroplasty    . Hysterotomy      There were no vitals filed for this visit.  Visit Diagnosis:  Difficulty walking  Lack of coordination  Posture abnormality    Pt arrives at 10:25; she thought PT was at 10:30, not 10:15.    Gait training: Pt brings in her new walking poles-requests PT to adjust them.  PT adjusts walking poles to appropriate height and instructs patient in proper use of walking poles.  Gait activities indoors and outdoors with use of bilateral walking poles to improve reciprocal arm swing, posture, and step length with long distance walking.  Prior to starting gait, pt performs standing PWR! Up and PWR! Rock x 10 using walking poles as support, to reset and warm up postural muscles.  During gait on indoor and outdoor surfaces, pt ambulates with decr. Step length, forward posture and decreased foot clearance.  Pt requires cues for and then becomes aware of to perform independently posture reset breaks during gait to lessen forward flexed posture.                              PT Short Term Goals - 09/28/14 1024    PT SHORT TERM GOAL #1   Title Demonstrate independence with HEP. Target 09/23/14   Status Achieved   PT SHORT TERM GOAL #2   Title Increase FGA score to 26/30 for improving dynamic balance.  Target 09/23/14   Baseline 09/28/14-Functional Gait assessment score 21/30 (decr from 23/30)   Status Not Met   PT SHORT TERM GOAL #3   Title Demonstrate ability to perform Manual TUG in <13.5 seconds without spilling water for improved coordination and multi tasking. Target 09/23/14   Baseline 09/28/14-14.75 sec   Status Not Met   PT SHORT TERM GOAL #4   Title Ambulate independently on level surfaces 500' without assistive device.  Target 09/23/14   Baseline 09/28/14-forward flexed posture, with pt holding hands on thighs to try to keep posture more upright   Status Achieved           PT Long Term Goals - 08/24/14 1341    PT LONG TERM GOAL #1   Title Verbalize understanding of Parkinson's specific community fitness opportunities. Target 10/23/14   PT LONG TERM GOAL #2   Title Increase FGA score to 29/30 for improved dynamic balance. Target 10/23/14   PT LONG TERM GOAL #3   Title Demonstrate ability to ambulate independently indoors with upright  posture x500' for decreased risk of back pain and for decreased risk of freezing episodes. Target 10/23/14   PT LONG TERM GOAL #4   Title Ambulate 1000' on outdoor level and unleve surfaces without assistive device with supervision for increasing independence with community ambulation. Target 10/23/14   PT LONG TERM GOAL #5   Title Complete TUG manual in 8.13 seconds without spilling. Target 10/23/14               Plan - 10/21/14 0905    Clinical Impression Statement Pt able to take frequent standing breaks during gait with walking poles in order to find and reset best posture, has to take those breaks every 25-30 ft.  However, by end of session, she is doing this independently, demonstrating increased awareness of  posture.  Pt will continue to benefit from further skilled PT to address posture, balance, gait, strength.     Pt will benefit from skilled therapeutic intervention in order to improve on the following deficits Abnormal gait;Decreased coordination;Decreased activity tolerance;Decreased strength;Postural dysfunction;Decreased balance;Difficulty walking   Rehab Potential Good   PT Frequency 2x / week   PT Duration 8 weeks   PT Treatment/Interventions ADLs/Self Care Home Management;Gait training;Therapeutic exercise;Patient/family education;Balance training;Neuromuscular re-education;Functional mobility training;Manual techniques;Therapeutic activities   PT Next Visit Plan posture, core stability and back exercises; discuss possible d/c next week vs renewal   Consulted and Agree with Plan of Care Patient        Problem List There are no active problems to display for this patient.   Jamaree Hosier W. 10/21/2014, 9:08 AM  Frazier Butt., PT  [facility] [address] [place], [place], [address] Phone: [phone]   Fax:  [phone]

## 2014-10-21 ENCOUNTER — Ambulatory Visit: Payer: Medicare Other | Admitting: Physical Therapy

## 2014-10-26 ENCOUNTER — Ambulatory Visit: Payer: Medicare Other | Admitting: Physical Therapy

## 2014-10-26 DIAGNOSIS — R279 Unspecified lack of coordination: Secondary | ICD-10-CM | POA: Diagnosis not present

## 2014-10-26 DIAGNOSIS — R262 Difficulty in walking, not elsewhere classified: Secondary | ICD-10-CM

## 2014-10-26 DIAGNOSIS — R293 Abnormal posture: Secondary | ICD-10-CM

## 2014-10-26 NOTE — Patient Instructions (Signed)
-  Ask about Duopa (a new way of delivering Carbi-dopa/Levi-dopa into your body) from your neurologist  -You mentioned trying to see Dr. Arbutus Leasat [facility] [phone]

## 2014-10-27 NOTE — Therapy (Signed)
[facility] [address] [place], [place], [address] Phone: [phone]   Fax:  [phone]  Physical Therapy Treatment  Patient Details  Name: Sharon Jenkins MRN: [id number] Date of Birth: June 13, 1951 Referring Provider:  Ricki Rodriguez, MD  Encounter Date: 10/26/2014      PT End of Session - 10/27/14 1555    Visit Number 9   PT Start Time 1020   PT Stop Time 1100   PT Time Calculation (min) 40 min   Activity Tolerance Patient tolerated treatment well   Behavior During Therapy [facility] for tasks assessed/performed      Past Medical History  Diagnosis Date  . Spinal stenosis of lumbar region   . Parkinson disease     Past Surgical History  Procedure Laterality Date  . Lumbar disc arthroplasty    . Hysterotomy      There were no vitals filed for this visit.  Visit Diagnosis:  Difficulty walking  Posture abnormality      Subjective Assessment - 10/26/14 1022    Subjective No changes, no falls; tired today.  Didn't want to to early morning exercises to save energy for PT.  Says multiple times during PT session, "I'm just done with this Parkinson's thing-I'm so frustrated."   Currently in Pain? No/denies            [facility] PT Assessment - 10/26/14 1028    Functional Gait  Assessment   Gait assessed  Yes   Gait Level Surface Walks 20 ft, slow speed, abnormal gait pattern, evidence for imbalance or deviates 10-15 in outside of the 12 in walkway width. Requires more than 7 sec to ambulate 20 ft.   Change in Gait Speed Able to smoothly change walking speed without loss of balance or gait deviation. Deviate no more than 6 in outside of the 12 in walkway width.   Gait with Horizontal Head Turns Performs head turns smoothly with slight change in gait velocity (eg, minor disruption to smooth gait path), deviates 6-10 in outside 12 in walkway width, or uses an assistive device.   Gait with Vertical Head Turns Performs  task with slight change in gait velocity (eg, minor disruption to smooth gait path), deviates 6 - 10 in outside 12 in walkway width or uses assistive device   Gait and Pivot Turn Pivot turns safely in greater than 3 sec and stops with no loss of balance, or pivot turns safely within 3 sec and stops with mild imbalance, requires small steps to catch balance.   Step Over Obstacle Is able to step over one shoe box (4.5 in total height) without changing gait speed. No evidence of imbalance.   Gait with Narrow Base of Support Is able to ambulate for 10 steps heel to toe with no staggering.   Gait with Eyes Closed Walks 20 ft, uses assistive device, slower speed, mild gait deviations, deviates 6-10 in outside 12 in walkway width. Ambulates 20 ft in less than 9 sec but greater than 7 sec.   Ambulating Backwards Walks 20 ft, no assistive devices, good speed, no evidence for imbalance, normal gait   Steps Alternating feet, no rail.   Total Score 23      TUG score: 10.03 seconds TUG manual score 10.10 seconds  Neuro Re-education: -Tall kneeling activities over ball-tall kneel<>sit back on heels x 10 reps. Tall kneel with alternating UE support. -Quadruped over therapy ball:  Alternating UE lifts x 5 reps, then alternating lower  extremity lifts x 5 reps, then alternating upper/lower extremity lifts x 5 reps with cues for abdominal activation.  Pt need frequent redirection cues during exercises, as pt performs various positions of yoga poses.                       PT Education - 10/27/14 1554    Education provided Yes   Education Details POC, progress towards goals, discussed pain as limiting factor as well as fluctuations throughout day with mobility   Person(s) Educated Patient   Methods Explanation   Comprehension Verbalized understanding          PT Short Term Goals - 09/28/14 1024    PT SHORT TERM GOAL #1   Title Demonstrate independence with HEP. Target 09/23/14   Status  Achieved   PT SHORT TERM GOAL #2   Title Increase FGA score to 26/30 for improving dynamic balance.  Target 09/23/14   Baseline 09/28/14-Functional Gait assessment score 21/30 (decr from 23/30)   Status Not Met   PT SHORT TERM GOAL #3   Title Demonstrate ability to perform Manual TUG in <13.5 seconds without spilling water for improved coordination and multi tasking. Target 09/23/14   Baseline 09/28/14-14.75 sec   Status Not Met   PT SHORT TERM GOAL #4   Title Ambulate independently on level surfaces 500' without assistive device.  Target 09/23/14   Baseline 09/28/14-forward flexed posture, with pt holding hands on thighs to try to keep posture more upright   Status Achieved           PT Long Term Goals - 10/26/14 1038    PT LONG TERM GOAL #1   Title Verbalize understanding of Parkinson's specific community fitness opportunities. Target 10/23/14   Status Achieved   PT LONG TERM GOAL #2   Title Increase FGA score to 29/30 for improved dynamic balance. Target 10/23/14   Baseline FGA score 23/30   Status Not Met   PT LONG TERM GOAL #3   Title Demonstrate ability to ambulate independently indoors with upright posture x500' for decreased risk of back pain and for decreased risk of freezing episodes. Target 10/23/14   Status Not Met   PT LONG TERM GOAL #4   Title Ambulate 1000' on outdoor level and unleve surfaces without assistive device with supervision for increasing independence with community ambulation. Target 10/23/14   Status Not Met   PT LONG TERM GOAL #5   Title Complete TUG manual in 8.13 seconds without spilling. Target 10/23/14   Baseline TUG manual score 10.10 seconds   Status Not Met               Plan - 10/27/14 1556    Clinical Impression Statement Pt appears very frustrated today that pain level increases with walking, limiting ability to walk further distances.  She declines outdoor walking today to check her goals, as she reports she is walking already at home with walking  poles.  Pt has met LTG #1 and not met remaining long term goals.  Pt is only able to ambulate <100 ft before back pain and forward flexed posture requires seated or standing rest break.  Encouraged pt to discuss current pain, motor fluctuations with her neurologist.  Plan for discharge this visit.   Pt will benefit from skilled therapeutic intervention in order to improve on the following deficits Abnormal gait;Decreased coordination;Decreased activity tolerance;Decreased strength;Postural dysfunction;Decreased balance;Difficulty walking   PT Treatment/Interventions ADLs/Self Care Home Management;Gait training;Therapeutic exercise;Patient/family education;Balance  training;Neuromuscular re-education;Functional mobility training;Manual techniques;Therapeutic activities   PT Next Visit Plan dc this visit   Consulted and Agree with Plan of Care Patient          G-Codes - November 24, 2014 1603    Functional Assessment Tool Used FGA 23/30, TUG manual 10.10 seconds   Functional Limitation Mobility: Walking and moving around   Mobility: Walking and Moving Around Goal Status [phone]) At least 20 percent but less than 40 percent impaired, limited or restricted   Mobility: Walking and Moving Around Discharge Status [phone]) At least 20 percent but less than 40 percent impaired, limited or restricted    PHYSICAL THERAPY DISCHARGE SUMMARY  Visits from Start of Care: 9  Current functional level related to goals / functional outcomes:     PT Long Term Goals - 11-24-14 1038    PT LONG TERM GOAL #1   Title Verbalize understanding of Parkinson's specific community fitness opportunities. Target 10/23/14   Status Achieved   PT LONG TERM GOAL #2   Title Increase FGA score to 29/30 for improved dynamic balance. Target 10/23/14   Baseline FGA score 23/30   Status Not Met   PT LONG TERM GOAL #3   Title Demonstrate ability to ambulate independently indoors with upright posture x500' for decreased risk of back pain and for  decreased risk of freezing episodes. Target 10/23/14   Status Not Met   PT LONG TERM GOAL #4   Title Ambulate 1000' on outdoor level and unleve surfaces without assistive device with supervision for increasing independence with community ambulation. Target 10/23/14   Status Not Met   PT LONG TERM GOAL #5   Title Complete TUG manual in 8.13 seconds without spilling. Target 10/23/14   Baseline TUG manual score 10.10 seconds   Status Not Met         PT Short Term Goals - 09/28/14 1024    PT SHORT TERM GOAL #1   Title Demonstrate independence with HEP. Target 09/23/14   Status Achieved   PT SHORT TERM GOAL #2   Title Increase FGA score to 26/30 for improving dynamic balance.  Target 09/23/14   Baseline 09/28/14-Functional Gait assessment score 21/30 (decr from 23/30)   Status Not Met   PT SHORT TERM GOAL #3   Title Demonstrate ability to perform Manual TUG in <13.5 seconds without spilling water for improved coordination and multi tasking. Target 09/23/14   Baseline 09/28/14-14.75 sec   Status Not Met   PT SHORT TERM GOAL #4   Title Ambulate independently on level surfaces 500' without assistive device.  Target 09/23/14   Baseline 09/28/14-forward flexed posture, with pt holding hands on thighs to try to keep posture more upright   Status Achieved       Remaining deficits: Postural abnormality, significant forward flexed posture, bradykinesia, decreased timing and coordination of gait.   Education / Equipment: Pt has been educated in [employer].  Plan: Patient agrees to discharge.  Patient goals were not met. Patient is being discharged due to the patient's request.  ?????      Problem List There are no active problems to display for this patient.   Marae Cottrell W. 10/27/2014, 4:05 PM  Frazier Butt., PT  [facility] [address] [place], [place], [address] Phone: [phone]   Fax:  [phone]

## 2014-10-28 ENCOUNTER — Ambulatory Visit: Payer: Medicare Other | Admitting: Physical Therapy

## 2014-11-02 ENCOUNTER — Ambulatory Visit
Admission: RE | Admit: 2014-11-02 | Discharge: 2014-11-02 | Disposition: A | Discharge status: Home / Self Care | Payer: Medicare Other | Source: Ambulatory Visit | Attending: Obstetrics & Gynecology | Admitting: Obstetrics & Gynecology

## 2014-11-02 ENCOUNTER — Other Ambulatory Visit: Payer: Medicare Other

## 2014-11-02 DIAGNOSIS — N644 Mastodynia: Secondary | ICD-10-CM

## 2015-05-02 ENCOUNTER — Observation Stay (HOSPITAL_COMMUNITY)
Admission: EM | Admit: 2015-05-02 | Discharge: 2015-05-03 | Disposition: A | Discharge status: Home / Self Care | Payer: Medicare Other | Attending: Cardiovascular Disease | Admitting: Cardiovascular Disease

## 2015-05-02 ENCOUNTER — Emergency Department (HOSPITAL_COMMUNITY): Payer: Medicare Other

## 2015-05-02 ENCOUNTER — Encounter (HOSPITAL_COMMUNITY): Payer: Self-pay | Admitting: Emergency Medicine

## 2015-05-02 DIAGNOSIS — G20A1 Parkinson's disease without dyskinesia, without mention of fluctuations: Secondary | ICD-10-CM | POA: Diagnosis present

## 2015-05-02 DIAGNOSIS — I480 Paroxysmal atrial fibrillation: Secondary | ICD-10-CM | POA: Diagnosis present

## 2015-05-02 DIAGNOSIS — Z79899 Other long term (current) drug therapy: Secondary | ICD-10-CM | POA: Diagnosis not present

## 2015-05-02 DIAGNOSIS — I4891 Unspecified atrial fibrillation: Principal | ICD-10-CM | POA: Insufficient documentation

## 2015-05-02 DIAGNOSIS — G20 Parkinson's disease: Secondary | ICD-10-CM | POA: Insufficient documentation

## 2015-05-02 DIAGNOSIS — R531 Weakness: Secondary | ICD-10-CM

## 2015-05-02 DIAGNOSIS — I499 Cardiac arrhythmia, unspecified: Secondary | ICD-10-CM | POA: Diagnosis not present

## 2015-05-02 DIAGNOSIS — M4806 Spinal stenosis, lumbar region: Secondary | ICD-10-CM | POA: Diagnosis not present

## 2015-05-02 DIAGNOSIS — R413 Other amnesia: Secondary | ICD-10-CM | POA: Diagnosis present

## 2015-05-02 HISTORY — DX: Major depressive disorder, single episode, unspecified: F32.9

## 2015-05-02 HISTORY — DX: Anxiety disorder, unspecified: F41.9

## 2015-05-02 HISTORY — DX: Depression, unspecified: F32.A

## 2015-05-02 HISTORY — DX: Unspecified chronic bronchitis: J42

## 2015-05-02 HISTORY — DX: Sleep apnea, unspecified: G47.30

## 2015-05-02 HISTORY — DX: Acute embolism and thrombosis of unspecified deep veins of unspecified lower extremity: I82.409

## 2015-05-02 HISTORY — DX: Pneumonia, unspecified organism: J18.9

## 2015-05-02 HISTORY — DX: Unspecified asthma, uncomplicated: J45.909

## 2015-05-02 LAB — URINE MICROSCOPIC-ADD ON: RBC / HPF: NONE SEEN RBC/hpf (ref 0–5)

## 2015-05-02 LAB — URINALYSIS, ROUTINE W REFLEX MICROSCOPIC
BILIRUBIN URINE: NEGATIVE
Glucose, UA: NEGATIVE mg/dL
Hgb urine dipstick: NEGATIVE
KETONES UR: 15 mg/dL — AB
NITRITE: NEGATIVE
PH: 5.5 (ref 5.0–8.0)
Protein, ur: NEGATIVE mg/dL
SPECIFIC GRAVITY, URINE: 1.015 (ref 1.005–1.030)

## 2015-05-02 LAB — CBC
HEMATOCRIT: 41 % (ref 36.0–46.0)
HEMOGLOBIN: 13.7 g/dL (ref 12.0–15.0)
MCH: 30.8 pg (ref 26.0–34.0)
MCHC: 33.4 g/dL (ref 30.0–36.0)
MCV: 92.1 fL (ref 78.0–100.0)
Platelets: 246 10*3/uL (ref 150–400)
RBC: 4.45 MIL/uL (ref 3.87–5.11)
RDW: 13.1 % (ref 11.5–15.5)
WBC: 8.1 10*3/uL (ref 4.0–10.5)

## 2015-05-02 LAB — BASIC METABOLIC PANEL
ANION GAP: 7 (ref 5–15)
BUN: 17 mg/dL (ref 6–20)
CHLORIDE: 102 mmol/L (ref 101–111)
CO2: 29 mmol/L (ref 22–32)
Calcium: 10 mg/dL (ref 8.9–10.3)
Creatinine, Ser: 0.86 mg/dL (ref 0.44–1.00)
GFR calc Af Amer: >60 mL/min (ref >60)
GFR calc non Af Amer: >60 mL/min (ref >60)
GLUCOSE: 104 mg/dL — AB (ref 65–99)
POTASSIUM: 3.9 mmol/L (ref 3.5–5.1)
Sodium: 138 mmol/L (ref 135–145)

## 2015-05-02 LAB — I-STAT TROPONIN, ED: TROPONIN I, POC: 0 ng/mL (ref 0.00–0.08)

## 2015-05-02 LAB — I-STAT BETA HCG BLOOD, ED (MC, WL, AP ONLY)

## 2015-05-02 LAB — TSH: TSH: 1.336 u[IU]/mL (ref 0.350–4.500)

## 2015-05-02 LAB — MAGNESIUM: Magnesium: 2.2 mg/dL (ref 1.7–2.4)

## 2015-05-02 MED ORDER — AMIODARONE HCL IN DEXTROSE 360-4.14 MG/200ML-% IV SOLN
60.0000 mg/h | INTRAVENOUS | Status: AC
  Administered 2015-05-02 (×2): 60 mg/h via INTRAVENOUS
  Filled 2015-05-02: qty 200

## 2015-05-02 MED ORDER — POLYVINYL ALCOHOL 1.4 % OP SOLN
1.0000 [drp] | Freq: Two times a day (BID) | OPHTHALMIC | Status: DC | PRN
  Filled 2015-05-02 (×2): qty 15

## 2015-05-02 MED ORDER — SODIUM CHLORIDE 0.9 % IV BOLUS (SEPSIS)
1000.0000 mL | Freq: Once | INTRAVENOUS | Status: AC
  Administered 2015-05-02: 1000 mL via INTRAVENOUS

## 2015-05-02 MED ORDER — SODIUM CHLORIDE 0.9 % IJ SOLN
3.0000 mL | Freq: Two times a day (BID) | INTRAMUSCULAR | Status: DC
  Administered 2015-05-03: 3 mL via INTRAVENOUS

## 2015-05-02 MED ORDER — ASPIRIN 81 MG PO CHEW
324.0000 mg | CHEWABLE_TABLET | Freq: Once | ORAL | Status: AC
  Administered 2015-05-02: 324 mg via ORAL
  Filled 2015-05-02: qty 4

## 2015-05-02 MED ORDER — ROPINIROLE HCL ER 8 MG PO TB24
8.0000 mg | ORAL_TABLET | Freq: Every day | ORAL | Status: DC
  Administered 2015-05-03: 8 mg via ORAL
  Filled 2015-05-02 (×2): qty 1

## 2015-05-02 MED ORDER — SODIUM CHLORIDE 0.9 % IV SOLN: 50.0000 mg | Freq: Once | INTRAVENOUS | Status: DC

## 2015-05-02 MED ORDER — ENSURE ENLIVE PO LIQD
237.0000 mL | Freq: Two times a day (BID) | ORAL | Status: DC
Start: 2015-05-03 — End: 2015-05-03
  Administered 2015-05-03: 237 mL via ORAL

## 2015-05-02 MED ORDER — ONDANSETRON HCL 4 MG/2ML IJ SOLN: 4.0000 mg | Freq: Four times a day (QID) | INTRAMUSCULAR | Status: DC | PRN

## 2015-05-02 MED ORDER — DILTIAZEM LOAD VIA INFUSION
10.0000 mg | Freq: Once | INTRAVENOUS | Status: DC
  Filled 2015-05-02: qty 10

## 2015-05-02 MED ORDER — CLONAZEPAM 0.5 MG PO TABS
0.5000 mg | ORAL_TABLET | Freq: Two times a day (BID) | ORAL | Status: DC | PRN
  Administered 2015-05-02: 0.5 mg via ORAL
  Filled 2015-05-02: qty 1

## 2015-05-02 MED ORDER — AMIODARONE HCL 200 MG PO TABS
400.0000 mg | ORAL_TABLET | Freq: Two times a day (BID) | ORAL | Status: DC
  Administered 2015-05-02 – 2015-05-03 (×2): 400 mg via ORAL
  Filled 2015-05-02 (×2): qty 2

## 2015-05-02 MED ORDER — ACETAMINOPHEN 325 MG PO TABS: 650.0000 mg | ORAL_TABLET | ORAL | Status: DC | PRN

## 2015-05-02 MED ORDER — AMIODARONE LOAD VIA INFUSION
150.0000 mg | Freq: Once | INTRAVENOUS | Status: AC
  Administered 2015-05-02: 150 mg via INTRAVENOUS
  Filled 2015-05-02: qty 83.34

## 2015-05-02 MED ORDER — CARBIDOPA-LEVODOPA 25-100 MG PO TABS
1.0000 | ORAL_TABLET | ORAL | Status: DC
  Filled 2015-05-02: qty 1

## 2015-05-02 MED ORDER — SERTRALINE HCL 50 MG PO TABS
50.0000 mg | ORAL_TABLET | Freq: Every day | ORAL | Status: DC
  Administered 2015-05-02: 50 mg via ORAL
  Filled 2015-05-02: qty 1

## 2015-05-02 MED ORDER — SODIUM CHLORIDE 0.9 % IJ SOLN: 3.0000 mL | INTRAMUSCULAR | Status: DC | PRN

## 2015-05-02 MED ORDER — APIXABAN 5 MG PO TABS
5.0000 mg | ORAL_TABLET | Freq: Two times a day (BID) | ORAL | Status: DC
Start: 2015-05-02 — End: 2015-05-03
  Administered 2015-05-02 – 2015-05-03 (×2): 5 mg via ORAL
  Filled 2015-05-02 (×2): qty 1

## 2015-05-02 MED ORDER — CARBIDOPA-LEVODOPA 25-100 MG PO TABS
1.5000 | ORAL_TABLET | ORAL | Status: DC
  Administered 2015-05-02 – 2015-05-03 (×6): 1.5 via ORAL
  Filled 2015-05-02 (×5): qty 2

## 2015-05-02 MED ORDER — CALCIUM CITRATE 950 (200 CA) MG PO TABS
600.0000 mg | ORAL_TABLET | Freq: Every day | ORAL | Status: DC
  Filled 2015-05-02: qty 3

## 2015-05-02 MED ORDER — DILTIAZEM HCL 100 MG IV SOLR
5.0000 mg/h | INTRAVENOUS | Status: DC
  Administered 2015-05-02 (×2): 5 mg/h via INTRAVENOUS
  Filled 2015-05-02: qty 100

## 2015-05-02 MED ORDER — AMIODARONE HCL IN DEXTROSE 360-4.14 MG/200ML-% IV SOLN
30.0000 mg/h | INTRAVENOUS | Status: DC
  Filled 2015-05-02: qty 200

## 2015-05-02 MED ORDER — CARBOXYMETHYLCELLULOSE SODIUM 1 % OP SOLN: 1.0000 [drp] | Freq: Two times a day (BID) | OPHTHALMIC | Status: DC | PRN

## 2015-05-02 MED ORDER — SODIUM CHLORIDE 0.9 % IV SOLN: 250.0000 mL | INTRAVENOUS | Status: DC | PRN

## 2015-05-02 NOTE — H&P (Signed)
Patient ID: Sharon Jenkins MRN: [id number], DOB/AGE: 64/09/1950   Admit date: 05/02/2015   Primary Physician: No primary care provider on file. Primary Cardiologist: New  HPI: Pleasant 64 y/o female, moved her 1 yr ago from [place]. She was widowed 3 yrs ago. She now lives down the street from her daughter and has someone who comes in and cooks. She has Parkinson's, is dependent on a walker, and has had some cognitive decline as well. She apparently had an episode of PAF in 2014 and was hospitalized overnight. She says she was not put on a any medication and converted on her own. She has never seen a cardiologist. She was in her usual state of health till this morning when she woke up feeling weak. She went to her PCP (Dr Hyacinth Meeker at [facility]) and was found to be hypotensive and in AF with RVR- 152. She is now seen in the ED- HR 118. No chest pain, no dyspnea, she is unaware of her heart rate.    Problem List: Past Medical History  Diagnosis Date  . Spinal stenosis of lumbar region   . Parkinson disease [facility])     Past Surgical History  Procedure Laterality Date  . Lumbar disc arthroplasty    . Hysterotomy       Allergies:  Allergies  Allergen Reactions  . Lidocaine Hives     Home Medications Prior to Admission medications   Medication Sig Start Date End Date Taking? Authorizing Provider  calcium citrate (CALCITRATE - DOSED IN MG ELEMENTAL CALCIUM) 950 MG tablet Take 600 mg of elemental calcium by mouth daily.   Yes Historical Provider, MD  carbidopa-levodopa (SINEMET IR) 25-100 MG per tablet Take 1 tablet by mouth every 3 (three) hours.    Yes Historical Provider, MD  clonazePAM (KLONOPIN) 0.5 MG tablet Take 0.5 mg by mouth 2 (two) times daily as needed for anxiety.   Yes Historical Provider, MD  Polyvinyl Alcohol-Povidone (REFRESH OP) Apply 1 drop to eye as needed (dry eyes).   Yes Historical Provider, MD  Probiotic Product (PROBIOTIC PO) Take 1 tablet by mouth daily.    Yes Historical Provider, MD  rOPINIRole (REQUIP XL) 8 MG 24 hr tablet Take 8 mg by mouth at bedtime.   Yes Historical Provider, MD  sertraline (ZOLOFT) 50 MG tablet Take 50 mg by mouth daily.   Yes Historical Provider, MD     FM Hx: negative for early CAD  Social History   Social History  . Marital Status: Widowed    Spouse Name: N/A  . Number of Children: N/A  . Years of Education: N/A   Occupational History  . Not on file.   Social History Main Topics  . Smoking status: Never Smoker   . Smokeless tobacco: Not on file  . Alcohol Use: No  . Drug Use: No  . Sexual Activity: Not on file   Other Topics Concern  . Not on file   Social History Narrative     Review of Systems: General: negative for chills, fever, night sweats or weight changes.  Cardiovascular: negative for chest pain, dyspnea on exertion, edema, orthopnea, palpitations, paroxysmal nocturnal dyspnea or shortness of breath HEENT: negative for any visual disturbances, blindness, glaucoma Dermatological: negative for rash Respiratory: negative for cough, hemoptysis, or wheezing Urologic: negative for hematuria or dysuria Abdominal: negative for nausea, vomiting, diarrhea, bright red blood per rectum, melena, or hematemesis Neurologic: negative for visual changes, syncope, or dizziness Musculoskeletal: negative for  back pain, joint pain, or swelling Psych: cooperative and appropriate All other systems reviewed and are otherwise negative except as noted above.  Physical Exam: Blood pressure 104/80, pulse 113, temperature 97.4 F (36.3 C), temperature source Oral, resp. rate 15, SpO2 98 %.  General appearance: alert, cooperative, no distress and flat affect Neck: no carotid bruit and no JVD Lungs: clear to auscultation bilaterally Heart: irregularly irregular rhythm Abdomen: soft, non-tender; bowel sounds normal; no masses,  no organomegaly Extremities: extremities normal, atraumatic, no cyanosis or  edema Pulses: 2+ and symmetric Skin: Skin color, texture, turgor normal. No rashes or lesions Neurologic: Grossly normal    Labs:   Results for orders placed or performed during the hospital encounter of 05/02/15 (from the past 24 hour(s))  Basic metabolic panel     Status: Abnormal   Collection Time: 05/02/15 11:37 AM  Result Value Ref Range   Sodium 138 135 - 145 mmol/L   Potassium 3.9 3.5 - 5.1 mmol/L   Chloride 102 101 - 111 mmol/L   CO2 29 22 - 32 mmol/L   Glucose, Bld 104 (H) 65 - 99 mg/dL   BUN 17 6 - 20 mg/dL   Creatinine, Ser 0.980.86 0.44 - 1.00 mg/dL   Calcium 11.910.0 8.9 - 14.710.3 mg/dL   GFR calc non Af Amer >60 >60 mL/min   GFR calc Af Amer >60 >60 mL/min   Anion gap 7 5 - 15  CBC     Status: None   Collection Time: 05/02/15 11:37 AM  Result Value Ref Range   WBC 8.1 4.0 - 10.5 K/uL   RBC 4.45 3.87 - 5.11 MIL/uL   Hemoglobin 13.7 12.0 - 15.0 g/dL   HCT 82.941.0 56.236.0 - 13.046.0 %   MCV 92.1 78.0 - 100.0 fL   MCH 30.8 26.0 - 34.0 pg   MCHC 33.4 30.0 - 36.0 g/dL   RDW 86.513.1 78.411.5 - 69.615.5 %   Platelets 246 150 - 400 K/uL  TSH     Status: None   Collection Time: 05/02/15 11:37 AM  Result Value Ref Range   TSH 1.336 0.350 - 4.500 uIU/mL  Magnesium     Status: None   Collection Time: 05/02/15 11:37 AM  Result Value Ref Range   Magnesium 2.2 1.7 - 2.4 mg/dL  I-Stat Troponin, ED (not at [facility])     Status: None   Collection Time: 05/02/15 11:50 AM  Result Value Ref Range   Troponin i, poc 0.00 0.00 - 0.08 ng/mL   Comment 3          I-Stat Beta hCG blood, ED ([facility], [facility], [facility] only)     Status: None   Collection Time: 05/02/15 12:03 PM  Result Value Ref Range   I-stat hCG, quantitative <5.0 <5 mIU/mL   Comment 3          Urinalysis, Routine w reflex microscopic (not at [facility])     Status: Abnormal   Collection Time: 05/02/15 12:15 PM  Result Value Ref Range   Color, Urine YELLOW YELLOW   APPearance HAZY (A) CLEAR   Specific Gravity, Urine 1.015 1.005 - 1.030   pH 5.5 5.0 - 8.0    Glucose, UA NEGATIVE NEGATIVE mg/dL   Hgb urine dipstick NEGATIVE NEGATIVE   Bilirubin Urine NEGATIVE NEGATIVE   Ketones, ur 15 (A) NEGATIVE mg/dL   Protein, ur NEGATIVE NEGATIVE mg/dL   Nitrite NEGATIVE NEGATIVE   Leukocytes, UA LARGE (A) NEGATIVE  Urine microscopic-add on  Status: Abnormal   Collection Time: 05/02/15 12:15 PM  Result Value Ref Range   Squamous Epithelial / LPF 0-5 (A) NONE SEEN   WBC, UA TOO NUMEROUS TO COUNT 0 - 5 WBC/hpf   RBC / HPF NONE SEEN 0 - 5 RBC/hpf   Bacteria, UA FEW (A) NONE SEEN   Casts HYALINE CASTS (A) NEGATIVE   Urine-Other MUCOUS PRESENT      Radiology/Studies: Dg Chest Port 1 View  05/02/2015  CLINICAL DATA:  64 year old with atrial fibrillation complaining of dizziness and lightheadedness all day. Current history of Parkinson's disease. EXAM: PORTABLE CHEST 1 VIEW COMPARISON:  07/19/2014 chest x-ray and CT chest. FINDINGS: Cardiomediastinal silhouette unremarkable, unchanged. Lungs clear. Bronchovascular markings normal. Pulmonary vascularity normal. No visible pleural effusions. No pneumothorax. IMPRESSION: No acute cardiopulmonary disease. Electronically Signed   By: Hulan Saas M.D.   On: 05/02/2015 12:56    EKG: AF with RVR  ASSESSMENT AND PLAN:  Principal Problem:   Weakness Active Problems:   Atrial fibrillation with RVR - unknown duration   Parkinson disease (HCC)   Memory deficit   PLAN: Pt is CHADs Vasc 1 for sex. Will review anticoagulation with MD. B/P still soft, probably unable to tolerate Diltiazem for rate, will try Amiodarone. Check echo, TSH and lytes WNL.    SignedAbelino Derrick, PA-C 05/02/2015, 1:45 PM [phone]  I have personally seen and examined this patient with Corine Shelter, PA-C. I agree with the assessment and plan as outlined above. She has atrial fibrillation of uncertain duration. She is weak but not aware of palpitations. BP is soft on Cardizem 5 mg IV per hour. Will stop Cardizem drip and start  IV amiodarone. Will start Eliquis 5 mg po BID. Will plan echo tomorrow when rate is better controlled. Discussed on phone with her son Orvilla Fus who is a pacer rep for [employer] in [place].   Timiko Offutt 05/02/2015 2:42 PM

## 2015-05-02 NOTE — ED Provider Notes (Signed)
CSN: [id number]     Arrival date & time 05/02/15  1052 History   First MD Initiated Contact with Patient 05/02/15 1105     Chief Complaint  Patient presents with  . Atrial Fibrillation     (Consider location/radiation/quality/duration/timing/severity/associated sxs/prior Treatment) Patient is a 64 y.o. female presenting with atrial fibrillation. The history is provided by the patient and a relative. No language interpreter was used.  Atrial Fibrillation Pertinent negatives include no abdominal pain, chest pain, chills, coughing, fever or vomiting.  Sharon Jenkins is a 64 year old female with a history of Parkinson's disease who presents via EMS from [facility] primary care physician for increased weakness and fatigue that began earlier today. She was found to be in rapid A. fib with a history of the same in the past, with reported conversions to normal sinus rhythm spontaneously. She denies being on any anticoagulation medication. She denies any recent illness, fever, cough, shortness of breath, chest pain, abdominal pain, vomiting, diarrhea, dysuria, hematuria, or urinary frequency. She reports having a stress test and an echocardiogram done in the past.  Past Medical History  Diagnosis Date  . Spinal stenosis of lumbar region   . Parkinson disease [facility](HCC)    Past Surgical History  Procedure Laterality Date  . Lumbar disc arthroplasty    . Hysterotomy     History reviewed. No pertinent family history. Social History  Substance Use Topics  . Smoking status: Never Smoker   . Smokeless tobacco: None  . Alcohol Use: No   OB History    No data available     Review of Systems  Constitutional: Negative for fever and chills.  Respiratory: Negative for cough and shortness of breath.   Cardiovascular: Negative for chest pain.  Gastrointestinal: Negative for vomiting and abdominal pain.  All other systems reviewed and are negative.     Allergies  Lidocaine  Home Medications   Prior  to Admission medications   Medication Sig Start Date End Date Taking? Authorizing Provider  calcium citrate (CALCITRATE - DOSED IN MG ELEMENTAL CALCIUM) 950 MG tablet Take 600 mg of elemental calcium by mouth daily.   Yes Historical Provider, MD  carbidopa-levodopa (SINEMET IR) 25-100 MG per tablet Take 1 tablet by mouth every 3 (three) hours.    Yes Historical Provider, MD  clonazePAM (KLONOPIN) 0.5 MG tablet Take 0.5 mg by mouth 2 (two) times daily as needed for anxiety.   Yes Historical Provider, MD  Polyvinyl Alcohol-Povidone (REFRESH OP) Apply 1 drop to eye as needed (dry eyes).   Yes Historical Provider, MD  Probiotic Product (PROBIOTIC PO) Take 1 tablet by mouth daily.   Yes Historical Provider, MD  rOPINIRole (REQUIP XL) 8 MG 24 hr tablet Take 8 mg by mouth at bedtime.   Yes Historical Provider, MD  sertraline (ZOLOFT) 50 MG tablet Take 50 mg by mouth daily.   Yes Historical Provider, MD   BP 104/80 mmHg  Pulse 113  Temp(Src) 97.4 F (36.3 C) (Oral)  Resp 15  SpO2 98% Physical Exam  Constitutional: She is oriented to person, place, and time. She appears well-developed and well-nourished.  HENT:  Head: Normocephalic and atraumatic.  Eyes: Conjunctivae are normal.  Neck: Normal range of motion. Neck supple.  Cardiovascular: Normal heart sounds.  An irregularly irregular rhythm present.  Irregularly irregular heart rate.  Pulmonary/Chest: Effort normal and breath sounds normal.  Lungs are clear to auscultation bilaterally. No shortness of breath or respiratory distress.  Abdominal: Soft. There is no tenderness.  No abdominal tenderness to palpation.  Musculoskeletal: Normal range of motion.  Neurological: She is alert and oriented to person, place, and time. She has normal strength. No sensory deficit. GCS eye subscore is 4. GCS verbal subscore is 5. GCS motor subscore is 6.  Skin: Skin is warm and dry.  Nursing note and vitals reviewed.   ED Course  Procedures (including  critical care time) Labs Review Labs Reviewed  BASIC METABOLIC PANEL - Abnormal; Notable for the following:    Glucose, Bld 104 (*)    All other components within normal limits  URINALYSIS, ROUTINE W REFLEX MICROSCOPIC (NOT AT [facility]) - Abnormal; Notable for the following:    APPearance HAZY (*)    Ketones, ur 15 (*)    Leukocytes, UA LARGE (*)    All other components within normal limits  URINE MICROSCOPIC-ADD ON - Abnormal; Notable for the following:    Squamous Epithelial / LPF 0-5 (*)    Bacteria, UA FEW (*)    Casts HYALINE CASTS (*)    All other components within normal limits  URINE CULTURE  CBC  TSH  MAGNESIUM  I-STAT TROPOININ, ED  I-STAT BETA HCG BLOOD, ED ([facility], [facility], [facility] ONLY)    Imaging Review Dg Chest Port 1 View  05/02/2015  CLINICAL DATA:  64 year old with atrial fibrillation complaining of dizziness and lightheadedness all day. Current history of Parkinson's disease. EXAM: PORTABLE CHEST 1 VIEW COMPARISON:  07/19/2014 chest x-ray and CT chest. FINDINGS: Cardiomediastinal silhouette unremarkable, unchanged. Lungs clear. Bronchovascular markings normal. Pulmonary vascularity normal. No visible pleural effusions. No pneumothorax. IMPRESSION: No acute cardiopulmonary disease. Electronically Signed   By: Hulan Saas M.D.   On: 05/02/2015 12:56   I have personally reviewed and evaluated these images and lab results as part of my medical decision-making.   EKG Interpretation   Date/Time:  Tuesday May 02 2015 12:46:13 EST Ventricular Rate:  112 PR Interval:    QRS Duration: 76 QT Interval:  331 QTC Calculation: 452 R Axis:   20 Text Interpretation:  Atrial fibrillation Anterior infarct, old Confirmed  by DELO  MD, DOUGLAS ([id number]) on 05/02/2015 1:54:22 PM      MDM   Final diagnoses:  Afib [facility])   Patient presents for atrial fibrillation after feeling weak this morning and seeing her primary care physician at [facility]. She was sent to the ED via EMS. She is  not on any anticoagulation medication. She is well appearing and denies any chest pain, shortness of breath, or recent illness. She is unaware that she is in atrial fibrillation which is shown on her EKG. She states that she lives on her own down the street from her daughter. Cardizem drip was ordered but held due to hypotension. Patient was given 2 boluses of fluids which increased her pressure. Recheck: Initial Cardizem bolus was not initiated but Cardizem drip was started. Basic labs were ordered as well as a troponin, mag, and TSH which are all unremarkable. A. fib can sometimes be caused by a trigger such as an infection so a urine was collected. It shows that she has large leukocytes and too numerous to count white blood cells. She is currently asymptomatic with no dysuria, hematuria, or urinary frequency. Urine culture was sent and is pending. I am not going to treat her for UTI at this time. Patient is well-appearing and in no acute distress. She has been sleeping throughout her stay. I spoke with Trish from cardiology who states that cards will see the  patient. Patient was admitted by cardiology.      Catha Gosselin, PA-C 05/02/15 1425  Geoffery Lyons, MD 05/02/15 1445

## 2015-05-02 NOTE — ED Notes (Signed)
MD Delo made aware pts bp 104/80 HR 113.

## 2015-05-02 NOTE — Progress Notes (Signed)
Called for report. ED nurse assigned to call back.

## 2015-05-02 NOTE — ED Notes (Signed)
Pt from [facility]CP via [facility] with c/o increased weakness and fatigue starting today.  Pt was found to be in rapid a-fib with hx of the same, conversion reported to be spontaneous previously.  Pt denies CP, SOB, emesis.  Some dizziness while standing and mild nausea.  Hx of parkinson's.  Pt in NAD, A&O.

## 2015-05-03 ENCOUNTER — Observation Stay (HOSPITAL_BASED_OUTPATIENT_CLINIC_OR_DEPARTMENT_OTHER): Payer: Medicare Other

## 2015-05-03 DIAGNOSIS — I4891 Unspecified atrial fibrillation: Secondary | ICD-10-CM | POA: Diagnosis not present

## 2015-05-03 DIAGNOSIS — M4806 Spinal stenosis, lumbar region: Secondary | ICD-10-CM | POA: Diagnosis not present

## 2015-05-03 DIAGNOSIS — I499 Cardiac arrhythmia, unspecified: Secondary | ICD-10-CM | POA: Diagnosis not present

## 2015-05-03 DIAGNOSIS — G20 Parkinson's disease: Secondary | ICD-10-CM | POA: Diagnosis not present

## 2015-05-03 LAB — CBC
HCT: 37.5 % (ref 36.0–46.0)
Hemoglobin: 12.2 g/dL (ref 12.0–15.0)
MCH: 30.3 pg (ref 26.0–34.0)
MCHC: 32.5 g/dL (ref 30.0–36.0)
MCV: 93.3 fL (ref 78.0–100.0)
Platelets: 207 10*3/uL (ref 150–400)
RBC: 4.02 MIL/uL (ref 3.87–5.11)
RDW: 13.4 % (ref 11.5–15.5)
WBC: 6.1 10*3/uL (ref 4.0–10.5)

## 2015-05-03 LAB — BASIC METABOLIC PANEL
Anion gap: 5 (ref 5–15)
BUN: 12 mg/dL (ref 6–20)
CO2: 30 mmol/L (ref 22–32)
Calcium: 9.3 mg/dL (ref 8.9–10.3)
Chloride: 106 mmol/L (ref 101–111)
Creatinine, Ser: 0.81 mg/dL (ref 0.44–1.00)
GFR calc Af Amer: >60 mL/min (ref >60)
GFR calc non Af Amer: >60 mL/min (ref >60)
Glucose, Bld: 99 mg/dL (ref 65–99)
Potassium: 4.6 mmol/L (ref 3.5–5.1)
Sodium: 141 mmol/L (ref 135–145)

## 2015-05-03 LAB — URINE CULTURE: Special Requests: NORMAL

## 2015-05-03 MED ORDER — APIXABAN 5 MG PO TABS: 5.0000 mg | ORAL_TABLET | Freq: Two times a day (BID) | ORAL | Status: DC

## 2015-05-03 MED ORDER — AMIODARONE HCL 200 MG PO TABS: ORAL_TABLET | ORAL | Status: DC

## 2015-05-03 MED ORDER — CALCIUM CARBONATE 1250 (500 CA) MG PO TABS
1250.0000 mg | ORAL_TABLET | Freq: Every day | ORAL | Status: DC
  Administered 2015-05-03: 1250 mg via ORAL
  Filled 2015-05-03: qty 3

## 2015-05-03 NOTE — Discharge Instructions (Signed)

## 2015-05-03 NOTE — Discharge Summary (Signed)
CARDIOLOGY DISCHARGE SUMMARY   Patient ID: Sharon Jenkins MRN: [id number] DOB/AGE: 1951/06/07 64 y.o.  Admit date: 05/02/2015 Discharge date: 05/03/2015  PCP: Redmond BasemanWONG,FRANCIS PATRICK, MD Primary Cardiologist: New - Dr. Clifton JamesMcAlhany  Primary Discharge Diagnosis: Atrial fibrillation with RVR - unknown duration Secondary Discharge Diagnosis: Weakness, Parkinson disease (HCC)   Memory deficit   Atrial fibrillation (HCC)  Consults: Physical Therapy  Procedures: Transthoracic Echocardiogram  Hospital Course: Sharon Jenkins is a 64 y.o. female with past medical history of Parkinson's Disease and Paroxsymal Atrial Fibrillation (in 2014) who [facility] 05/[facility]2015 from her PCP for atrial fibrillation with RVR.  She had began feeling weak and fatigued starting earlier that day and went to her PCP for evaluation where an EKG confirmed she was in atrial fibrillation. She was unaware of her tachycardiac rate and denied any palpitations, chest pain, or shortness of breath. She reported having been in the rhythm in 2014 but spontaneously converted back to NSR without requiring medications or DCCV.  In the ED, she was started on a Cardizem drip but was unable to tolerate the medication secondary to hypotension. She was therefore started on IV Amiodarone instead. She was started on Eliquis for anticoagulation.  Overnight, she converted to NSR. Her HR was in the 60's - 70's at time of examination on 05/03/2015. She reported feeling well and said her weakness and fatigue from the previous day had resolved. Her IV Amiodarone was switched to 400mg  PO BID. She ambulated over 300 feet with physical therapy using her walker and was able to climb 10 stairs. Physical Therapy recommended home health PT and this has been arranged by the case manager.  The patient was last examined by Dr. Clifton JamesMcAlhany and deemed stable for discharge. She will need to be on Amiodarone 400mg  PO BID for one week, then 200mg   PO BID. She preferred to have 200mg  tablets so she would not have to cut the pills starting next week, so this was written for with instructions to reflect the dosing schedule above. She was also given a printed Rx for Eliquis to use with the coupon voucher along with an Rx with refills. She will follow-up in the Atrial Fibrillation clinic on 05/09/2015.   Labs:   Lab Results  Component Value Date   WBC 6.1 05/03/2015   HGB 12.2 05/03/2015   HCT 37.5 05/03/2015   MCV 93.3 05/03/2015   PLT 207 05/03/2015     Recent Labs Lab 05/03/15 0442  NA 141  K 4.6  CL 106  CO2 30  BUN 12  CREATININE 0.81  CALCIUM 9.3  GLUCOSE 99      Radiology:  Dg Chest Port 1 View: 05/02/2015  CLINICAL DATA:  64 year old with atrial fibrillation complaining of dizziness and lightheadedness all day. Current history of Parkinson's disease. EXAM: PORTABLE CHEST 1 VIEW COMPARISON:  07/19/2014 chest x-ray and CT chest. FINDINGS: Cardiomediastinal silhouette unremarkable, unchanged. Lungs clear. Bronchovascular markings normal. Pulmonary vascularity normal. No visible pleural effusions. No pneumothorax. IMPRESSION: No acute cardiopulmonary disease. Electronically Signed   By: Hulan Saashomas  Lawrence M.D.   On: 05/02/2015 12:56   EKG: Atrial Fibrillation with RVR. Rate in 150's.  Echo: 05/03/2015 Study Conclusions - Left ventricle: The cavity size was normal. Wall thickness was increased in a pattern of mild LVH. Systolic function was normal. The estimated ejection fraction was in the range of 50% to 55%. Wall motion was normal; there were no regional wall motion abnormalities. Features are  consistent with a pseudonormal left ventricular filling pattern, with concomitant abnormal relaxation and increased filling pressure (grade 2 diastolic dysfunction). - Mitral valve: There was mild regurgitation. - Left atrium: The atrium was mildly dilated.  FOLLOW UP PLANS AND APPOINTMENTS Allergies  Allergen  Reactions  . Lidocaine Hives     Medication List    TAKE these medications        amiodarone 200 MG tablet  Commonly known as:  PACERONE  Take 2 tablets ( ) by mouth twice daily for one week. Starting on 05/10/2015 take 1 tablet ( ) by mouth twice a day until instructed differently     apixaban 5 MG Tabs tablet  Commonly known as:  ELIQUIS  Take 1 tablet (5 mg total) by mouth 2 (two) times daily.     calcium citrate 950 MG tablet  Commonly known as:  CALCITRATE - dosed in mg elemental calcium  Take 600 mg of elemental calcium by mouth daily.     carbidopa-levodopa 25-100 MG tablet  Commonly known as:  SINEMET IR  Take 1 tablet by mouth every 3 (three) hours.     clonazePAM 0.5 MG tablet  Commonly known as:  KLONOPIN  Take 0.5 mg by mouth 2 (two) times daily as needed for anxiety.     PROBIOTIC PO  Take 1 tablet by mouth daily.     REFRESH OP  Apply 1 drop to eye as needed (dry eyes).     rOPINIRole 8 MG 24 hr tablet  Commonly known as:  REQUIP XL  Take 8 mg by mouth at bedtime.     sertraline 50 MG tablet  Commonly known as:  ZOLOFT  Take 50 mg by mouth daily.         Follow-up Information    Follow up with CARROLL,DONNA, NP On 05/09/2015.   Specialties:  Nurse Practitioner, Cardiology   Why:  Cardiology Follow-up for Atrial Fibrillation in the Heart and Vascular Center at [facility]. Appointment on 05/09/2015 at 10:00AM (There is a parking deck under the center. The code for parking is 9000).   Contact information:   [address] [place] [address] [phone]       Follow up with [facility].   Why:  Physical Therapy   Contact information:   [address] [place] [address] [phone]       BRING ALL MEDICATIONS WITH YOU TO FOLLOW UP APPOINTMENTS  Time spent with patient to include physician time: 40 minutes  Signed: Ellsworth Lennox, PA 05/03/2015, 4:09 PM Co-Sign MD

## 2015-05-03 NOTE — Evaluation (Signed)
Physical Therapy Evaluation Patient Details Name: Sharon Jenkins MRN: [id number] DOB: Jun 15, 1951 Today's Date: 05/03/2015   History of Present Illness  64 yo female w/ PMH of Parkinson's Disease and PAF (in 2014) who [facility] on 05/02/2015 for atrial fibrillation with RVR, rate in 150's.  Clinical Impression  Pt admitted with above diagnosis. Pt currently with functional limitations due to the deficits listed below (see PT Problem List). Pt will benefit from skilled PT to increase their independence and safety with mobility to allow discharge to the venue listed below.  Pt is normally very active, walking her dog and taking spin classes.  Pt was surprised at how weak she feels. Pt unable to attempt ambulation without AD for longer distance.  Recommend HHPT to work [facility] level ambulation in community.     Follow Up Recommendations Home health PT    Equipment Recommendations  None recommended by PT    Recommendations for Other Services       Precautions / Restrictions Precautions Precautions: Fall Restrictions Weight Bearing Restrictions: No      Mobility  Bed Mobility                  Transfers Overall transfer level: Modified independent               General transfer comment: stood to RW without difficulty  Ambulation/Gait Ambulation/Gait assistance: Min guard Ambulation Distance (Feet): 300 Feet Assistive device: Rolling walker (2 wheeled) Gait Pattern/deviations: Trunk flexed;Festinating     General Gait Details: Pt with forward flexed posture and festinating gait with preference to not stand still and talk to PT in one place "I have to keep moving".  In room, able to ambulate short distance without RW, but felt too weak to try without RW inhallway  Stairs Stairs: Yes Stairs assistance: Min guard Stair Management: Alternating pattern;Step to pattern;Two rails Number of Stairs: 10 General stair comments: Step  though pattern on ascent and step to pattern with descent  Wheelchair Mobility    Modified Rankin (Stroke Patients Only)       Balance Overall balance assessment: Needs assistance           Standing balance-Leahy Scale: Fair Standing balance comment: Pt has difficulty standing still due to Parkinson's                             Pertinent Vitals/Pain Pain Assessment: No/denies pain    Home Living Family/patient expects to be discharged to:: Private residence Living Arrangements: Alone (daughter lives nearby)   Type of Home: House Home Access: Stairs to enter Entrance Stairs-Rails: Left Entrance Stairs-Number of Steps: 4 Home Layout: Two level;Able to live on main level with bedroom/bathroom Home Equipment: Walker - 4 wheels Additional Comments: Amb in the house with rollator at times so she has a seat, but normally doesn't use, outside ambulates with 3 wheeled RW, walks her dog and goes to spin class 2x/week    Prior Function           Comments: Has an aide one day week to help with cooking     Hand Dominance   Dominant Hand: Right    Extremity/Trunk Assessment   Upper Extremity Assessment: Overall WFL for tasks assessed           Lower Extremity Assessment: Overall WFL for tasks assessed      Cervical / Trunk Assessment: Kyphotic  Communication      Cognition Arousal/Alertness: Awake/alert Behavior During Therapy: WFL for tasks assessed/performed Overall Cognitive Status: Within Functional Limits for tasks assessed                      General Comments General comments (skin integrity, edema, etc.): Pt was surprised at how weak she felt.    Exercises        Assessment/Plan    PT Assessment Patient needs continued PT services  PT Diagnosis Abnormality of gait   PT Problem List Decreased activity tolerance;Decreased mobility;Decreased knowledge of use of DME;Decreased balance  PT Treatment Interventions Gait  training;Stair training;Functional mobility training;Therapeutic activities;Therapeutic exercise;DME instruction;Balance training   PT Goals (Current goals can be found in the Care Plan section) Acute Rehab PT Goals Patient Stated Goal: To get stronger PT Goal Formulation: With patient/family Time For Goal Achievement: 05/10/15 Potential to Achieve Goals: Good    Frequency Min 3X/week   Barriers to discharge        Co-evaluation               End of Session Equipment Utilized During Treatment: Gait belt Activity Tolerance: Patient tolerated treatment well Patient left: in chair;with call bell/phone within reach;with family/visitor present Nurse Communication: Mobility status    Functional Assessment Tool Used: clinicak judgement and objective findings Functional Limitation: Mobility: Walking and moving around Mobility: Walking and Moving Around Current Status [phone]): At least 1 percent but less than 20 percent impaired, limited or restricted Mobility: Walking and Moving Around Goal Status [phone]): At least 1 percent but less than 20 percent impaired, limited or restricted    Time: 1101-1127 PT Time Calculation (min) (ACUTE ONLY): 26 min   Charges:   PT Evaluation $Initial PT Evaluation Tier I: 1 Procedure PT Treatments $Gait Training: 8-22 mins   PT G Codes:   PT G-Codes **NOT FOR INPATIENT CLASS** Functional Assessment Tool Used: clinicak judgement and objective findings Functional Limitation: Mobility: Walking and moving around Mobility: Walking and Moving Around Current Status ([id number]): At least 1 percent but less than 20 percent impaired, limited or restricted Mobility: Walking and Moving Around Goal Status [phone]): At least 1 percent but less than 20 percent impaired, limited or restricted    [facility] 05/03/2015, 11:39 AM

## 2015-05-03 NOTE — Progress Notes (Signed)
Echocardiogram 2D Echocardiogram has been performed.  Sharon Jenkins, Sharon Jenkins 05/03/2015, 2:23 PM

## 2015-05-03 NOTE — Progress Notes (Signed)
Patient Name: Sharon Jenkins Date of Encounter: 05/03/2015  Principal Problem:   Weakness Active Problems:   Atrial fibrillation with RVR - unknown duration   Parkinson disease (HCC)   Memory deficit   Atrial fibrillation [facility](HCC)   Primary Cardiologist: New - Dr. Clifton JamesMcAlhany Patient Profile: 64 yo female w/ PMH of Parkinson's Disease and PAF (in 2014) who [facility] on 05/02/2015 for atrial fibrillation with RVR, rate in 150's. Converted to NSR overnight.  SUBJECTIVE: Feeling well this AM. Denies any chest pain or palpitations. Reports her weakness has resolved.  OBJECTIVE Filed Vitals:   05/02/15 1610 05/02/15 2000 05/03/15 0641 05/03/15 0741  BP: 97/62 90/50 109/62 121/81  Pulse: 94 65 60 66  Temp:  97.6 F (36.4 C) 98.2 F (36.8 C) 98 F (36.7 C)  TempSrc: Oral Oral Oral Oral  Resp: 18 18 18 15   Height: 5\' 5"  (1.651 m)     Weight: 145 lb 8 oz (65.998 kg)  140 lb 3.2 oz (63.594 kg)   SpO2: 100% 96% 99% 100%    Intake/Output Summary (Last 24 hours) at 05/03/15 0800 Last data filed at 05/03/15 0551  Gross per 24 hour  Intake   2730 ml  Output   2225 ml  Net    505 ml   Filed Weights   05/02/15 1610 05/03/15 0641  Weight: 145 lb 8 oz (65.998 kg) 140 lb 3.2 oz (63.594 kg)    PHYSICAL EXAM General: Well developed, well nourished, female in no acute distress. Head: Normocephalic, atraumatic.  Neck: Supple without bruits, JVD not elevated. Lungs:  Resp regular and unlabored, CTA without wheezing or rales. Heart: Regular rate and rhythm, S1, S2, no S3, S4, or murmur; no rub. Abdomen: Soft, non-tender, non-distended with normoactive bowel sounds. No hepatomegaly. No rebound/guarding. No obvious abdominal masses. Extremities: No clubbing, cyanosis, or edema. Distal pedal pulses are 2+ bilaterally. Neuro: Alert and oriented X 3. Moves all extremities spontaneously. Psych: Normal affect.  LABS: CBC: Recent Labs  05/02/15 1137 05/03/15 0442  WBC 8.1  6.1  HGB 13.7 12.2  HCT 41.0 37.5  MCV 92.1 93.3  PLT 246 207   Basic Metabolic Panel: Recent Labs  05/02/15 1137 05/03/15 0442  NA 138 141  K 3.9 4.6  CL 102 106  CO2 29 30  GLUCOSE 104* 99  BUN 17 12  CREATININE 0.86 0.81  CALCIUM 10.0 9.3  MG 2.2  --    Recent Labs  05/02/15 1150  TROPIPOC 0.00    Thyroid Function Tests: Recent Labs  05/02/15 1137  TSH 1.336    TELE: Atrial Fibrillation with conversion to NSR overnight. Rate now in 60's - 70's.    ECHO: Pending  Radiology/Studies: Dg Chest Port 1 View: 05/02/2015  CLINICAL DATA:  64 year old with atrial fibrillation complaining of dizziness and lightheadedness all day. Current history of Parkinson's disease. EXAM: PORTABLE CHEST 1 VIEW COMPARISON:  07/19/2014 chest x-ray and CT chest. FINDINGS: Cardiomediastinal silhouette unremarkable, unchanged. Lungs clear. Bronchovascular markings normal. Pulmonary vascularity normal. No visible pleural effusions. No pneumothorax. IMPRESSION: No acute cardiopulmonary disease. Electronically Signed   By: Hulan Saashomas  Lawrence M.D.   On: 05/02/2015 12:56     Current Medications:  . amiodarone  400 mg Oral BID  . apixaban  5 mg Oral BID  . calcium carbonate  1,250 mg Oral Q breakfast  . carbidopa-levodopa  1.5 tablet Oral Q3H  . diltiazem  10 mg Intravenous Once  . feeding  supplement (ENSURE ENLIVE)  237 mL Oral BID BM  . rOPINIRole  8 mg Oral QHS  . sertraline  50 mg Oral Daily  . sodium chloride  3 mL Intravenous Q12H   . diltiazem (CARDIZEM) infusion Stopped (05/02/15 1526)    ASSESSMENT AND PLAN:  1. New Onset Atrial fibrillation with RVR - unknown duration This patients CHA2DS2-VASc Score and unadjusted Ischemic Stroke Rate (% per year) is equal to 0.6 % stroke rate/year from a score of 1 (Female). Has been started on Eliquis.  - TSH and BMET within normal limits. UA has large leukocytes, but also had epithelial cells so a contaminated sample. No symptoms at this time.  Culture is pending. - Echocardiogram to be obtained later today. - Started on IV Amiodarone load upon admission. Now on Amiodarone  BID. Converted to NSR overnight on telemetry. Obtaining EKG to confirm.   2. Parkinson disease (HCC) - continue home medications.  Can likely be discharged later today with close follow-up in atrial fibrillation clinic.    Signed, Ellsworth Lennox , PA-C 8:00 AM 05/03/2015 Pager: [phone]   I have personally seen and examined this patient with Randall An, PA-C. I agree with the assessment and plan as outlined above. She is in sinus today. Will continue Eliquis 5 mg po BID. Will continue amiodarone 400 mg po BID for one week then change to 200 mg po BID. LIkely discharge today after Echo. She will follow up in the atrial fib clinic after discharge.   MCALHANY,CHRISTOPHER 05/03/2015 11:04 AM

## 2015-05-03 NOTE — Progress Notes (Signed)
Nutrition Brief Note  Patient identified on the Malnutrition Screening Tool (MST) Report. Patient with minimal weight loss over the past year, 2% weight loss within one year is not significant for the time frame.  Wt Readings from Last 15 Encounters:  05/03/15 140 lb 3.2 oz (63.594 kg)  07/19/14 143 lb (64.864 kg)    Body mass index is 23.33 kg/(m^2). Patient meets criteria for normal weight based on current BMI.   Current diet order is heart healthy, patient is consuming approximately 75-100% of meals at this time. Labs and medications reviewed.   No nutrition interventions warranted at this time. If nutrition issues arise, please consult RD.   Joaquin CourtsKimberly Orli Degrave, RD, LDN, CNSC Pager [phone] After Hours Pager [phone]

## 2015-05-03 NOTE — Progress Notes (Signed)
Accidentally pulled out IV access x 2. Pt for d/c. Will restart if needed.

## 2015-05-03 NOTE — Care Management Note (Addendum)
Case Management Note  Patient Details  Name: Sharon Jenkins MRN: [id number] Date of Birth: 07/09/1950  Subjective/Objective:   Pt admitted for increased weakness. Pt is from home alone. [facility]                Action/Plan: CM [facility] and SOC to begin within 24-48 hours post d/c. CM did provide pt with 30 day free card for Eliquis. Pt uses [facility] and medication is available. No further needs from CM at this time.    Expected Discharge Date:                  Expected Discharge Plan:  Home w Home Health Services  In-House Referral:  NA  Discharge planning Services  CM Consult  Post Acute Care Choice:  Home Health Choice offered to:  Patient  DME Arranged:  N/A DME Agency:  NA  HH Arranged:  PT [facility]  Status of Service:  Completed, signed off  Medicare Important Message Given:    Date Medicare IM Given:    Medicare IM give by:    Date Additional Medicare IM Given:    Additional Medicare Important Message give by:     If discussed at Long Length of Stay Meetings, dates discussed:    Additional Comments:  05/03/15- 1515- Donn PieriniKristi Eathan Groman RN, BSN - received call from Cardiology PA- [place] - regarding pt's medications- per conversation pt is worried about using other pharmacies and which ones are in network- pt's preferred pharmacy is [facility]- call made to [facility]- which has Eliquis in stock however does not have needed Ammio in stock-  Check completed with insurance- Per rep at [facility] [employer] [facility]:  No auth required for Eliquis, covered, $40 for 30 day retail/ 90 day supply mail order $80  Patient can use any retail pharmacy  Pt has already been given 30 day free card.   Came to speak with pt and son at bedside- explained to both that pt could use any retail pharmacy to fill prescriptions- explained issue with preferred pharmacy not having Ammio drug in stock today-  pt and son would prefer to have paper scripts so that they can go to another pharmacy to have meds filled at discharge. Spoke with PA- [place] who will give pt paper scripts at discharge.    Gala LewandowskyGraves-Bigelow, Brenda Kaye, RN 05/03/2015, 12:24 PM

## 2015-05-08 ENCOUNTER — Telehealth: Payer: Self-pay | Admitting: Cardiovascular Disease

## 2015-05-08 NOTE — Telephone Encounter (Signed)
Left message to call back  

## 2015-05-08 NOTE — Telephone Encounter (Signed)
Spoke with pt. She reports she is unable to tolerate Amiodarone 400 mg twice daily.  Pt reports it exacerbates her Parkinson symptoms and causes extreme shaking.  Causes her a lot of anxiety.  Last night she took only 200 mg and symptoms were better.  She feels she is able to take 400 mg in the AM but not in the PM and would like to decrease dose.  I told her ideally we would like her to take as ordered but if she can only tolerate 200 mg tonight she should take that dose and discuss with Rudi Cocoonna Carroll at appt tomorrow.

## 2015-05-08 NOTE — Telephone Encounter (Signed)
Pt c/o medication issue:  1. Name of Medication: Amniodorone  2. How are you currently taking this medication (dosage and times per day)? 2 tabs in am and 2 tabs in pm  3. Are you having a reaction (difficulty breathing--STAT)?   4. What is your medication issue? Pt calling stating that this medication is making her neck stiff and racing of her tremors (with Parkinson's) have increased

## 2015-05-09 ENCOUNTER — Ambulatory Visit (HOSPITAL_COMMUNITY)
Admit: 2015-05-09 | Discharge: 2015-05-09 | Disposition: A | Discharge status: Home / Self Care | Payer: Medicare Other | Source: Ambulatory Visit | Attending: Nurse Practitioner | Admitting: Nurse Practitioner

## 2015-05-09 ENCOUNTER — Telehealth (HOSPITAL_COMMUNITY): Payer: Self-pay | Admitting: Nurse Practitioner

## 2015-05-09 ENCOUNTER — Encounter (HOSPITAL_COMMUNITY): Payer: Self-pay | Admitting: Nurse Practitioner

## 2015-05-09 VITALS — BP 114/72 | HR 61 | Ht 65.0 in | Wt 140.2 lb

## 2015-05-09 DIAGNOSIS — I4891 Unspecified atrial fibrillation: Secondary | ICD-10-CM

## 2015-05-09 MED ORDER — AMIODARONE HCL 200 MG PO TABS: 200.0000 mg | ORAL_TABLET | Freq: Every day | ORAL | Status: DC

## 2015-05-09 MED ORDER — APIXABAN 5 MG PO TABS: 5.0000 mg | ORAL_TABLET | Freq: Two times a day (BID) | ORAL | Status: DC

## 2015-05-09 NOTE — Telephone Encounter (Signed)
Pt was seen in office this am with normal bp, SR and no symptoms of shaking. On amiodarone load 400 mg bid and feels as thought she does not tolerate. Instructed to start in am taking 200 mg d a day.   Her daughter called and said that pt was shaking in arms and legs and she was having cramps in her feet, throat was tight and BP was 167/84. She wonders if she is having a stroke and wanted to call EMS. She told me earlier today, she had had these symptoms over the last couple of days since starting amiodarone and felt tight in her throat as well with episodes. She will start taking 200 mg daily tomorrow, no more amiodarone tonight.  I told the daughter that her symptoms do not sound like a stroke, but I am not there to evaluate and if the pt feels as though she is in distress then proceed to the ER.

## 2015-05-09 NOTE — Patient Instructions (Signed)
Your physician has recommended you make the following change in your medication:  1)Decrease amiodarone 200mg  once a day   Scheduler will be in touch with you regarding having you established with Dr. Clifton JamesMcalhany.

## 2015-05-09 NOTE — Progress Notes (Signed)
Patient ID: Sharon Jenkins, female   DOB: [birth year], 64 y.o.   MRN: [id number]     Primary Care Physician: Redmond Baseman, MD Referring Physician: [facility] f/u   Sharon Jenkins is a 64 y.o. female with a h/o Parkinson's,PAF, dx in 2014, who presented to [facility] ED on 05/02/2015 from her PCP for atrial fibrillation with RVR.  She had began feeling weak and fatigued starting earlier that day and went to her PCP for evaluation where an EKG confirmed she was in atrial fibrillation. She was unaware of her tachycardiac rate and denied any palpitations, chest pain, or shortness of breath. She reported having been in the rhythm in 2014 but spontaneously converted back to NSR without requiring medications or DCCV.  In the ED, she was started on a Cardizem drip but was unable to tolerate the medication secondary to hypotension. She was therefore started on IV Amiodarone instead. She was started on Eliquis for anticoagulation.  Overnight, she converted to NSR. Her HR was in the 60's - 70's at time of examination on 05/03/2015. She reported feeling well and said her weakness and fatigue from the previous day had resolved. Her IV Amiodarone was switched to  PO BID. She ambulated over 300 feet with physical therapy using her walker and was able to climb 10 stairs. Physical Therapy recommended home health PT and this has been arranged by the case manager.  The patient was last examined by Dr. Clifton James and deemed stable for discharge. She will need to be on Amiodarone  PO BID for one week, then  PO BID. She preferred to have  tablets so she would not have to cut the pills starting next week, so this was written for with instructions to reflect the dosing schedule above. She was also given a printed Rx for Eliquis to use with the coupon voucher along with an Rx with refills. She will follow-up in the Atrial Fibrillation clinic on 05/09/2015.   She follows up in the afib clinic today and  reports that she is no tolerating the amiodarone load very well. She has been having a lot of shaking of her hands and feet and not sleeping well. She tried taking just 200 mg amiodarone one day and felt like her usual self. She is in SR today and has not felt any return of afib. She is tolerating blood thinner without issues.  She denies drinking any caffeine, alcohol. She is not overweight and she does water aerobics and yoga several times a week. She did have a sleep study in the [place] area 1-2 years, which was positive for sleep apnea but never started due to fear of the mask. Echo showed normal ef with a grade 2 diastolic dysfunction and mildly dilated left atrium.  Today, she denies symptoms of palpitations, chest pain, shortness of breath, orthopnea, PND, lower extremity edema, dizziness, presyncope, syncope, or neurologic sequela. The patient is tolerating medications without difficulties and is otherwise without complaint today.   Past Medical History  Diagnosis Date  . Spinal stenosis of lumbar region   . Parkinson disease (HCC) dx'd ~ 2009  . DVT (deep venous thrombosis) (HCC)     "don't remember which side"  . Asthma   . Walking pneumonia X 1  . Chronic bronchitis (HCC)     "I'm good for a case a year" (05/02/2015)  . Sleep apnea     "I chose not to wear a mask" (05/02/2015)  . Anxiety   . Depression  Past Surgical History  Procedure Laterality Date  . Lumbar disc arthroplasty  1991  . Knee arthroscopy Left     "meniscus tear"  . Back surgery    . Abdominal hysterectomy  ~ 2006  . Dilation and curettage of uterus  1974  . Tubal ligation  05/1978  . Cataract extraction w/ intraocular lens  implant, bilateral Bilateral 1998  . Genioplasty  ~ 1610-9604    "placed screw and bone matter in my chin & brought it forward"  . Varicose vein surgery Right 1987    Current Outpatient Prescriptions  Medication Sig Dispense Refill  . amiodarone (PACERONE) 200 MG tablet Take 1 tablet  (200 mg total) by mouth daily. 30 tablet 6  . apixaban (ELIQUIS) 5 MG TABS tablet Take 1 tablet (5 mg total) by mouth 2 (two) times daily. 60 tablet 6  . calcium citrate (CALCITRATE - DOSED IN MG ELEMENTAL CALCIUM) 950 MG tablet Take 600 mg of elemental calcium by mouth daily.    . carbidopa-levodopa (SINEMET IR) 25-100 MG per tablet Take 1 tablet by mouth every 3 (three) hours.     . clonazePAM (KLONOPIN) 0.5 MG tablet Take 0.5 mg by mouth 2 (two) times daily as needed for anxiety.    . Multiple Vitamin (MULTIVITAMINS PO) Take 1 tablet by mouth daily.    . Polyvinyl Alcohol-Povidone (REFRESH OP) Apply 1 drop to eye as needed (dry eyes).    . Probiotic Product (PROBIOTIC PO) Take 1 tablet by mouth daily.    Marland Kitchen rOPINIRole (REQUIP XL) 8 MG 24 hr tablet Take 8 mg by mouth at bedtime.    . sertraline (ZOLOFT) 50 MG tablet Take 50 mg by mouth daily.     No current facility-administered medications for this encounter.    Allergies  Allergen Reactions  . Lidocaine Hives    Social History   Social History  . Marital Status: Widowed    Spouse Name: N/A  . Number of Children: N/A  . Years of Education: N/A   Occupational History  . Not on file.   Social History Main Topics  . Smoking status: Never Smoker   . Smokeless tobacco: Never Used  . Alcohol Use: No  . Drug Use: No  . Sexual Activity: Not Currently   Other Topics Concern  . Not on file   Social History Narrative    No family history on file.  ROS- All systems are reviewed and negative except as per the HPI above  Physical Exam: Filed Vitals:   05/09/15 1000  BP: 114/72  Pulse: 61  Height:  (1.651 m)  Weight: 140 lb 3.2 oz (63.594 kg)    GEN- The patient is well appearing, alert and oriented x 3 today, with flat facial affect.   Head- normocephalic, atraumatic Eyes-  Sclera clear, conjunctiva pink Ears- hearing intact Oropharynx- clear Neck- supple, no JVP Lymph- no cervical lymphadenopathy Lungs- Clear  to ausculation bilaterally, normal work of breathing Heart- Regular rate and rhythm, no murmurs, rubs or gallops, PMI not laterally displaced GI- soft, NT, ND, + BS Extremities- no clubbing, cyanosis, or edema MS- no significant deformity or atrophy Skin- no rash or lesion Psych- euthymic mood, full affect Neuro- strength and sensation are intact  EKG- NSR at 61 bpm, pr int 138 ms, qrs int 428 ms, qtc 428 ms.  Epic records reviewed.  Echo-Left ventricle: The cavity size was normal. Wall thickness was increased in a pattern of mild LVH. Systolic function was normal.  The estimated ejection fraction was in the range of 50% to 55%. Wall motion was normal; there were no regional wall motion abnormalities. Features are consistent with a pseudonormal left ventricular filling pattern, with concomitant abnormal relaxation and increased filling pressure (grade 2 diastolic dysfunction). - Mitral valve: There was mild regurgitation. - Left atrium: The atrium was mildly dilated.  Assessment and Plan: 1. Afib In SR but is not tolerating loading dose of amiodarone due to increase in tremors and insomnia Decrease dose to 200 mg a day Continue apixaban for now with  a chadsvasc score of 1  Will see in two weeks to see tolerance of 200 mg dose of amiodarone Consider liver panel as baseline at that time, PFT's, if pt is tolerating and will stay on drug. TSH recently checked and in normal range Will request  an appointment with Dr. Clifton JamesMcalhany in 6-8 weeks.  Elvina Sidleonna C. Matthew Folksarroll, ANP-C [facility] [address] [place], [place] [address] [phone]

## 2015-05-09 NOTE — Telephone Encounter (Signed)
I checked with in house pharmacy and she said that amio may increase sinemet  levels in the body causing the cramping, dystonia. I advised the pt and she will check with her neurologists to see if sinemet levels need to be temporily decreased, pharmD agreed with lowering dose of amio and symptoms should improve over next several days. Pt. sounded ok over phone but said she was still shaking and said symptoms started within 15 mins or so of taking parkinson drug this pm.

## 2015-05-15 ENCOUNTER — Telehealth: Payer: Self-pay | Admitting: Cardiovascular Disease

## 2015-05-15 NOTE — Telephone Encounter (Signed)
Spoke with pt and told her I did not see call placed to her.  She has no questions.

## 2015-05-15 NOTE — Telephone Encounter (Signed)
New message     Patient calling stating someone called her - returning the call back

## 2015-05-15 NOTE — Telephone Encounter (Signed)
I do not see where call was placed to pt from our office.  I checked with Afib clinic and they did not call pt.  I placed call to pt and left message to call back

## 2015-05-15 NOTE — Telephone Encounter (Signed)
Follow Up ° °Pt called back  °

## 2015-05-18 ENCOUNTER — Encounter (HOSPITAL_COMMUNITY): Payer: Self-pay | Admitting: *Deleted

## 2015-05-18 ENCOUNTER — Emergency Department (HOSPITAL_COMMUNITY): Payer: Medicare Other

## 2015-05-18 ENCOUNTER — Emergency Department (HOSPITAL_COMMUNITY)
Admission: EM | Admit: 2015-05-18 | Discharge: 2015-05-18 | Disposition: A | Discharge status: Home / Self Care | Payer: Medicare Other | Attending: Emergency Medicine | Admitting: Emergency Medicine

## 2015-05-18 DIAGNOSIS — R079 Chest pain, unspecified: Secondary | ICD-10-CM | POA: Diagnosis present

## 2015-05-18 DIAGNOSIS — R42 Dizziness and giddiness: Secondary | ICD-10-CM | POA: Diagnosis not present

## 2015-05-18 DIAGNOSIS — Z8739 Personal history of other diseases of the musculoskeletal system and connective tissue: Secondary | ICD-10-CM | POA: Diagnosis not present

## 2015-05-18 DIAGNOSIS — J45909 Unspecified asthma, uncomplicated: Secondary | ICD-10-CM | POA: Diagnosis not present

## 2015-05-18 DIAGNOSIS — F419 Anxiety disorder, unspecified: Secondary | ICD-10-CM | POA: Insufficient documentation

## 2015-05-18 DIAGNOSIS — Z8701 Personal history of pneumonia (recurrent): Secondary | ICD-10-CM | POA: Diagnosis not present

## 2015-05-18 DIAGNOSIS — Z7901 Long term (current) use of anticoagulants: Secondary | ICD-10-CM | POA: Diagnosis not present

## 2015-05-18 DIAGNOSIS — R531 Weakness: Secondary | ICD-10-CM | POA: Diagnosis not present

## 2015-05-18 DIAGNOSIS — G20 Parkinson's disease: Secondary | ICD-10-CM | POA: Diagnosis not present

## 2015-05-18 DIAGNOSIS — R4789 Other speech disturbances: Secondary | ICD-10-CM | POA: Diagnosis not present

## 2015-05-18 DIAGNOSIS — R5383 Other fatigue: Secondary | ICD-10-CM | POA: Insufficient documentation

## 2015-05-18 DIAGNOSIS — Z79899 Other long term (current) drug therapy: Secondary | ICD-10-CM | POA: Diagnosis not present

## 2015-05-18 DIAGNOSIS — Z86718 Personal history of other venous thrombosis and embolism: Secondary | ICD-10-CM | POA: Diagnosis not present

## 2015-05-18 LAB — URINE MICROSCOPIC-ADD ON
Bacteria, UA: NONE SEEN
RBC / HPF: NONE SEEN RBC/hpf (ref 0–5)

## 2015-05-18 LAB — D-DIMER, QUANTITATIVE (NOT AT ARMC): D DIMER QUANT: 0.51 ug{FEU}/mL — AB (ref 0.00–0.50)

## 2015-05-18 LAB — URINALYSIS, ROUTINE W REFLEX MICROSCOPIC
Bilirubin Urine: NEGATIVE
Glucose, UA: NEGATIVE mg/dL
HGB URINE DIPSTICK: NEGATIVE
KETONES UR: NEGATIVE mg/dL
Nitrite: NEGATIVE
PROTEIN: NEGATIVE mg/dL
Specific Gravity, Urine: 1.015 (ref 1.005–1.030)
pH: 7 (ref 5.0–8.0)

## 2015-05-18 LAB — CBC
HEMATOCRIT: 40.4 % (ref 36.0–46.0)
HEMOGLOBIN: 13.2 g/dL (ref 12.0–15.0)
MCH: 30.6 pg (ref 26.0–34.0)
MCHC: 32.7 g/dL (ref 30.0–36.0)
MCV: 93.7 fL (ref 78.0–100.0)
Platelets: 245 10*3/uL (ref 150–400)
RBC: 4.31 MIL/uL (ref 3.87–5.11)
RDW: 13.3 % (ref 11.5–15.5)
WBC: 7 10*3/uL (ref 4.0–10.5)

## 2015-05-18 LAB — HEPATIC FUNCTION PANEL
ALT: 15 U/L (ref 14–54)
AST: 35 U/L (ref 15–41)
Albumin: 4.2 g/dL (ref 3.5–5.0)
Alkaline Phosphatase: 66 U/L (ref 38–126)
BILIRUBIN INDIRECT: 0.6 mg/dL (ref 0.3–0.9)
BILIRUBIN TOTAL: 0.7 mg/dL (ref 0.3–1.2)
Bilirubin, Direct: 0.1 mg/dL (ref 0.1–0.5)
Total Protein: 7.2 g/dL (ref 6.5–8.1)

## 2015-05-18 LAB — BASIC METABOLIC PANEL
ANION GAP: 9 (ref 5–15)
BUN: 16 mg/dL (ref 6–20)
CALCIUM: 9.4 mg/dL (ref 8.9–10.3)
CO2: 26 mmol/L (ref 22–32)
Chloride: 104 mmol/L (ref 101–111)
Creatinine, Ser: 0.94 mg/dL (ref 0.44–1.00)
GFR calc Af Amer: >60 mL/min (ref >60)
Glucose, Bld: 80 mg/dL (ref 65–99)
POTASSIUM: 3.9 mmol/L (ref 3.5–5.1)
SODIUM: 139 mmol/L (ref 135–145)

## 2015-05-18 LAB — I-STAT TROPONIN, ED: TROPONIN I, POC: 0 ng/mL (ref 0.00–0.08)

## 2015-05-18 MED ORDER — SODIUM CHLORIDE 0.9 % IV BOLUS (SEPSIS)
1000.0000 mL | Freq: Once | INTRAVENOUS | Status: AC
  Administered 2015-05-18: 1000 mL via INTRAVENOUS

## 2015-05-18 NOTE — ED Notes (Signed)
Pt is in stable condition upon d/c and is escorted from ED via wheelchair. 

## 2015-05-18 NOTE — ED Notes (Signed)
Pt has been d/c. She is currently just waiting for daughter to pick her up.

## 2015-05-18 NOTE — ED Notes (Signed)
Pt in from home via [facility], per report pt c/o L sided CP onset today @ 7:30, per report pt Zoloft was increased from 50 mg to 100mg  & Clonazepam increased to 0.5mg  yesterday, pt denies SOB, n/v/d, A&O x4

## 2015-05-18 NOTE — ED Provider Notes (Signed)
CSN: [id number]     Arrival date & time 05/18/15  1049 History   First MD Initiated Contact with Patient 05/18/15 1050     Chief Complaint  Patient presents with  . Chest Pain     (Consider location/radiation/quality/duration/timing/severity/associated sxs/prior Treatment) HPI  64 year old female presents with an acute onset of generalized fatigue and weakness. Started this morning shortly after taking her Zoloft. She states that yesterday her Zoloft was increased from 50 mg to 100 mg for increasing anxiety and depression. She called her psychiatrist who sent her in to the ER and patient states she was concerned she has serotonin syndrome. The patient denies any headaches or blurry vision. She states she feels "confused" which she further describes as dizziness. The patient states she is having a hard time speaking, clarifies this to be slow to speak, no slurred words. No focal weakness. Also endorses one month of constant left-sided chest pain over a rib inferior to her breast. This has not worsened or gotten better. Does not remember any trauma.  Past Medical History  Diagnosis Date  . Spinal stenosis of lumbar region   . Parkinson disease (HCC) dx'd ~ 2009  . DVT (deep venous thrombosis) (HCC)     "don't remember which side"  . Asthma   . Walking pneumonia X 1  . Chronic bronchitis (HCC)     "I'm good for a case a year" (05/02/2015)  . Sleep apnea     "I chose not to wear a mask" (05/02/2015)  . Anxiety   . Depression    Past Surgical History  Procedure Laterality Date  . Lumbar disc arthroplasty  1991  . Knee arthroscopy Left     "meniscus tear"  . Back surgery    . Abdominal hysterectomy  ~ 2006  . Dilation and curettage of uterus  1974  . Tubal ligation  05/1978  . Cataract extraction w/ intraocular lens  implant, bilateral Bilateral 1998  . Genioplasty  ~ 4098-1191    "placed screw and bone matter in my chin & brought it forward"  . Varicose vein surgery Right 1987    No family history on file. Social History  Substance Use Topics  . Smoking status: Never Smoker   . Smokeless tobacco: Never Used  . Alcohol Use: No   OB History    No data available     Review of Systems  Constitutional: Positive for fatigue. Negative for fever.  Respiratory: Negative for shortness of breath.   Cardiovascular: Positive for chest pain.  Genitourinary: Negative for dysuria.  Neurological: Positive for dizziness, speech difficulty and weakness. Negative for headaches.  Psychiatric/Behavioral: Positive for confusion.  All other systems reviewed and are negative.     Allergies  Lidocaine  Home Medications   Prior to Admission medications   Medication Sig Start Date End Date Taking? Authorizing Provider  amiodarone (PACERONE) 200 MG tablet Take 1 tablet (200 mg total) by mouth daily. 05/09/15   Newman Nip, NP  apixaban (ELIQUIS) 5 MG TABS tablet Take 1 tablet (5 mg total) by mouth 2 (two) times daily. 05/09/15   Newman Nip, NP  calcium citrate (CALCITRATE - DOSED IN MG ELEMENTAL CALCIUM) 950 MG tablet Take 600 mg of elemental calcium by mouth daily.    Historical Provider, MD  carbidopa-levodopa (SINEMET IR) 25-100 MG per tablet Take 1 tablet by mouth every 3 (three) hours.     Historical Provider, MD  clonazePAM (KLONOPIN) 0.5 MG tablet Take 0.5 mg by  mouth 2 (two) times daily as needed for anxiety.    Historical Provider, MD  Multiple Vitamin (MULTIVITAMINS PO) Take 1 tablet by mouth daily.    Historical Provider, MD  Polyvinyl Alcohol-Povidone (REFRESH OP) Apply 1 drop to eye as needed (dry eyes).    Historical Provider, MD  Probiotic Product (PROBIOTIC PO) Take 1 tablet by mouth daily.    Historical Provider, MD  rOPINIRole (REQUIP XL) 8 MG 24 hr tablet Take 8 mg by mouth at bedtime.    Historical Provider, MD  sertraline (ZOLOFT) 50 MG tablet Take 50 mg by mouth daily.    Historical Provider, MD   BP 136/74 mmHg  Pulse 59  Temp(Src) 97.5 F  (36.4 C) (Oral)  Resp 18  Ht 5\' 6"  (1.676 m)  Wt 136 lb (61.689 kg)  BMI 21.96 kg/m2  SpO2 97% Physical Exam  Constitutional: She is oriented to person, place, and time. She appears well-developed and well-nourished.  HENT:  Head: Normocephalic and atraumatic.  Right Ear: External ear normal.  Left Ear: External ear normal.  Nose: Nose normal.  Eyes: Right eye exhibits no discharge. Left eye exhibits no discharge.  Cardiovascular: Normal rate, regular rhythm and normal heart sounds.   Pulmonary/Chest: Effort normal and breath sounds normal. She exhibits tenderness.    Abdominal: Soft. There is no tenderness.  Neurological: She is alert and oriented to person, place, and time. She has normal reflexes.  CN 2-12 grossly intact. 5/5 strength in all 4 extremities. Grossly normal sensation. Normal finger to nose.   Skin: Skin is warm and dry.  Psychiatric:  Flat affect  Nursing note and vitals reviewed.   ED Course  Procedures (including critical care time) Labs Review Labs Reviewed  URINALYSIS, ROUTINE W REFLEX MICROSCOPIC (NOT AT [facility]) - Abnormal; Notable for the following:    Leukocytes, UA TRACE (*)    All other components within normal limits  D-DIMER, QUANTITATIVE (NOT AT [facility]) - Abnormal; Notable for the following:    D-Dimer, Quant 0.51 (*)    All other components within normal limits  URINE MICROSCOPIC-ADD ON - Abnormal; Notable for the following:    Squamous Epithelial / LPF 0-5 (*)    All other components within normal limits  BASIC METABOLIC PANEL  CBC  HEPATIC FUNCTION PANEL  Rosezena SensorI-STAT TROPOININ, ED    Imaging Review Dg Chest 2 View  05/18/2015  CLINICAL DATA:  Lethargy.  Atrial fibrillation. EXAM: CHEST  2 VIEW COMPARISON:  May 02, 2015 FINDINGS: There is no edema or consolidation. The heart size and pulmonary vascularity are normal. No adenopathy. There is mild degenerative change in the thoracic spine. There is evidence of old healed rib trauma on the  right superiorly and laterally. IMPRESSION: No edema or consolidation. Electronically Signed   By: Bretta BangWilliam  Woodruff III M.D.   On: 05/18/2015 12:17   Ct Head Wo Contrast  05/18/2015  CLINICAL DATA:  Confusion EXAM: CT HEAD WITHOUT CONTRAST TECHNIQUE: Contiguous axial images were obtained from the base of the skull through the vertex without intravenous contrast. COMPARISON:  CT 07/19/2014 FINDINGS: Mild atrophy and mild chronic microvascular ischemia. Negative for acute infarct. Negative for hemorrhage or mass. No fluid collection or edema in the brain. Contracted right maxillary sinus with mucosal thickening. Mucosal edema in the ethmoid sinuses. No skull lesion identified. IMPRESSION: Atrophy and mild chronic microvascular ischemia. No acute intracranial abnormality. Electronically Signed   By: Marlan Palauharles  Clark M.D.   On: 05/18/2015 12:01   I have personally  reviewed and evaluated these images and lab results as part of my medical decision-making.   EKG Interpretation   Date/Time:  Thursday May 18 2015 10:54:12 EST Ventricular Rate:  58 PR Interval:  138 QRS Duration: 100 QT Interval:  493 QTC Calculation: 484 R Axis:   -8 Text Interpretation:  Sinus bradycardia nonspecific ST/T changes  Anteroseptal infarct, age indeterminate no significant change since May 09 2015 Confirmed by Criss Alvine  MD, Yared Barefoot ([id number]) on 05/18/2015 11:09:38 AM      MDM   Final diagnoses:  Other fatigue    Patient with nonspecific weakness and fatigue. Given that her Zoloft was increased yesterday to think this is the most likely cause. I see no signs of serotonin syndrome including no rigidity, hyperthermia, pupil dilation, or tachycardia. Neuro exam unremarkable. Her chest pain is quite atypical and has been present constantly for over one month and is very reproducible. Very low suspicion for ACS or PE. Her age adjusted ddimer is negative. Patient no longer feels dizzy. Her blood pressures have been  borderline low, occasionally in the 90s. She states typically it runs in the 90s systolically at home when she checks. Thus there are no signs of hypertension and I feel she is stable for discharge to follow closely with her PCP. I requested she call her psychiatrist to see about dosing adjustments. Discussed strict return precautions.    Pricilla Loveless, MD 05/18/15 [phone]

## 2015-05-18 NOTE — ED Notes (Signed)
Pt refusing to have vital signs rechecked. Pt stated "they just done them".

## 2015-05-18 NOTE — ED Notes (Signed)
Attempted to contact lab about D-Dimer and Hepatic function tests. No answer.

## 2015-05-23 ENCOUNTER — Encounter (HOSPITAL_COMMUNITY): Payer: Self-pay | Admitting: Nurse Practitioner

## 2015-05-23 ENCOUNTER — Ambulatory Visit (HOSPITAL_COMMUNITY)
Admission: RE | Admit: 2015-05-23 | Discharge: 2015-05-23 | Disposition: A | Discharge status: Home / Self Care | Payer: Medicare Other | Source: Ambulatory Visit | Attending: Nurse Practitioner | Admitting: Nurse Practitioner

## 2015-05-23 VITALS — BP 108/68 | HR 67 | Ht 65.0 in | Wt 141.2 lb

## 2015-05-23 DIAGNOSIS — I4891 Unspecified atrial fibrillation: Secondary | ICD-10-CM | POA: Insufficient documentation

## 2015-05-23 NOTE — Progress Notes (Signed)
Patient ID: Sharon Jenkins, female   DOB: 23-Apr-1951, 64 y.o.   MRN: [id number]     Primary Care Physician: Redmond Baseman, MD Referring Physician: [facility] F/U   Sharon Jenkins is a 64 y.o. female with a h/o Parkinson's,PAF, dx in 2014, who presented to [facility] ED on 05/02/2015 from her PCP for atrial fibrillation with RVR.  She had began feeling weak and fatigued starting earlier that day and went to her PCP for evaluation where an EKG confirmed she was in atrial fibrillation. She was unaware of her tachycardiac rate and denied any palpitations, chest pain, or shortness of breath. She reported having been in the rhythm in 2014 but spontaneously converted back to NSR without requiring medications or DCCV.  In the ED, she was started on a Cardizem drip but was unable to tolerate the medication secondary to hypotension. She was therefore started on IV Amiodarone instead. She was started on Eliquis for anticoagulation.  Overnight, she converted to NSR. Her HR was in the 60's - 70's at time of examination on 05/03/2015. She reported feeling well and said her weakness and fatigue from the previous day had resolved. Her IV Amiodarone was switched to  PO BID. She ambulated over 300 feet with physical therapy using her walker and was able to climb 10 stairs. Physical Therapy recommended home health PT and this has been arranged by the case manager.  The patient was last examined by Dr. Clifton James and deemed stable for discharge. She will need to be on Amiodarone  PO BID for one week, then  PO BID. She preferred to have  tablets so she would not have to cut the pills starting next week, so this was written for with instructions to reflect the dosing schedule above. She was also given a printed Rx for Eliquis to use with the coupon voucher along with an Rx with refills  She  followed-up in the Atrial Fibrillation clinic on 05/09/2015,  and reported  that she was not tolerating the  amiodarone load very well. She has been having a lot of shaking of her hands and feet and not sleeping well. She tried taking just 200 mg amiodarone one day and felt like her usual self. She is in SR today and has not felt any return of afib. She is tolerating blood thinner without issues.   She denies drinking any caffeine, alcohol. She is not overweight and she does water aerobics and yoga several times a week. She did have a sleep study in the [place] area 1-2 years, which was positive for sleep apnea but never started due to fear of the mask. Echo showed normal ef with a grade 2 diastolic dysfunction and mildly dilated left atrium.  On f/u visit today, she reports that she is doing better on the amiodarone dose of 200 mg a day. She was having a lot of shaking and flapping of her arms and feet and called to the office several days after being seen in clinic and wondered if she needed to go to the ER, thought symptoms  were from amiodarone. I asked her to call her neurologist and they did not feel any adjustment of her parkinson drugs needed to be made. She recently saw her psychiatrist and she thought that her symptoms were from panic attacks, increased her zoloft and clonazepam. She then went to ER for weakness and it was thought to be from increase of zoloft. Today, she feels better, her outlook is better and she  seems more relaxed. She has not noticed any further afib. No further uncontrolled shaking of hands and feet. She would however, like to switch to another antiarrythmic in the future. Her sister is on [place]osyn and she would like to be considered for this drug after the first oof the year. She is aware that she will have to stop drug and allow for washout which make take 4-8 weeks. And she is on an antidepressant which may tiksoyn not a viable option. She is participating in a spin class on a regular basis and has not had any issues retuning to class on amiodarone.  Today, she denies symptoms of  palpitations, chest pain, shortness of breath, orthopnea, PND, lower extremity edema, dizziness, presyncope, syncope, or neurologic sequela. The patient is tolerating medications without difficulties and is otherwise without complaint today.   Past Medical History  Diagnosis Date  . Spinal stenosis of lumbar region   . Parkinson disease (HCC) dx'd ~ 2009  . DVT (deep venous thrombosis) (HCC)     "don't remember which side"  . Asthma   . Walking pneumonia X 1  . Chronic bronchitis (HCC)     "I'm good for a case a year" (05/02/2015)  . Sleep apnea     "I chose not to wear a mask" (05/02/2015)  . Anxiety   . Depression    Past Surgical History  Procedure Laterality Date  . Lumbar disc arthroplasty  1991  . Knee arthroscopy Left     "meniscus tear"  . Back surgery    . Abdominal hysterectomy  ~ 2006  . Dilation and curettage of uterus  1974  . Tubal ligation  05/1978  . Cataract extraction w/ intraocular lens  implant, bilateral Bilateral 1998  . Genioplasty  ~ [id number]    "placed screw and bone matter in my chin & brought it forward"  . Varicose vein surgery Right 1987    Current Outpatient Prescriptions  Medication Sig Dispense Refill  . amiodarone (PACERONE) 200 MG tablet Take 1 tablet (200 mg total) by mouth daily. 30 tablet 6  . apixaban (ELIQUIS) 5 MG TABS tablet Take 1 tablet (5 mg total) by mouth 2 (two) times daily. 60 tablet 6  . calcium citrate (CALCITRATE - DOSED IN MG ELEMENTAL CALCIUM) 950 MG tablet Take 600 mg of elemental calcium by mouth daily.    . carbidopa-levodopa (SINEMET IR) 25-100 MG per tablet Take 1.5 tablets by mouth every 3 (three) hours.     . clonazePAM (KLONOPIN) 0.5 MG tablet Take 0.25-0.5 mg by mouth 2 (two) times daily. Take 1/2 tablet by moyth at noon and take 1 tablet by mouth at bedtime    . Multiple Vitamin (MULTIVITAMINS PO) Take 1 tablet by mouth daily.    . Polyvinyl Alcohol-Povidone (REFRESH OP) Apply 1 drop to eye as needed (dry eyes).     . Probiotic Product (PROBIOTIC PO) Take 1 tablet by mouth daily.    Marland Kitchen. rOPINIRole (REQUIP XL) 8 MG 24 hr tablet Take 8 mg by mouth daily.     . sertraline (ZOLOFT) 50 MG tablet Take 100 mg by mouth daily.      No current facility-administered medications for this encounter.    Allergies  Allergen Reactions  . Lidocaine Hives    Social History   Social History  . Marital Status: Widowed    Spouse Name: N/A  . Number of Children: N/A  . Years of Education: N/A   Occupational History  . Not on  file.   Social History Main Topics  . Smoking status: Never Smoker   . Smokeless tobacco: Never Used  . Alcohol Use: No  . Drug Use: No  . Sexual Activity: Not Currently   Other Topics Concern  . Not on file   Social History Narrative    No family history on file.  ROS- All systems are reviewed and negative except as per the HPI above  Physical Exam: Filed Vitals:   05/23/15 0934  BP: 108/68  Pulse: 67  Height:  (1.651 m)  Weight: 141 lb 3.2 oz (64.048 kg)    GEN- The patient is well appearing, alert and oriented x 3 today, with flat facial affect.   Head- normocephalic, atraumatic Eyes-  Sclera clear, conjunctiva pink Ears- hearing intact Oropharynx- clear Neck- supple, no JVP Lymph- no cervical lymphadenopathy Lungs- Clear to ausculation bilaterally, normal work of breathing Heart- Regular rate and rhythm, no murmurs, rubs or gallops, PMI not laterally displaced GI- soft, NT, ND, + BS Extremities- no clubbing, cyanosis, or edema MS- no significant deformity or atrophy Skin- no rash or lesion Psych- euthymic mood, full affect Neuro- strength and sensation are intact  EKG- NSR at 67 bpm, pr int 140 ms, qrs int 90 ms, qtc 452 ms. Epic records reviewed. Liver checked in ER and normal.  Echo-Left ventricle: The cavity size was normal. Wall thickness was increased in a pattern of mild LVH. Systolic function was normal. The estimated ejection fraction was  in the range of 50% to 55%. Wall motion was normal; there were no regional wall motion abnormalities. Features are consistent with a pseudonormal left ventricular filling pattern, with concomitant abnormal relaxation and increased filling pressure (grade 2 diastolic dysfunction). - Mitral valve: There was mild regurgitation. - Left atrium: The atrium was mildly dilated.  Assessment and Plan: 1. Afib In SR and is tolerating 200 mg dose better. Continue apixaban for now with  a chadsvasc score of 1 Wants to be considered for another antiarrythmic but since she is doing better and is in SR, will defer until after the first of year to further discuss. She wants to be considered for tikosyn, but her antidepressant use may be a problem with prolongation of qt  potential and QTc at baseline is 452 ms.  F/u afib clinic 1/3 F/u with  Dr. Clifton James in 6-8 weeks.  Elvina Sidle [facility] [address] [place], [place] [address] [phone]

## 2015-06-20 ENCOUNTER — Telehealth: Payer: Self-pay | Admitting: Physician Assistant

## 2015-06-20 ENCOUNTER — Inpatient Hospital Stay (HOSPITAL_COMMUNITY)
Admission: RE | Admit: 2015-06-20 | Discharge: 2015-06-20 | Disposition: A | Discharge status: Home / Self Care | Payer: Medicare Other | Source: Ambulatory Visit | Attending: Nurse Practitioner | Admitting: Nurse Practitioner

## 2015-06-20 NOTE — Telephone Encounter (Signed)
The patient called to report worsening tremors, blurry vision, HA, fatigue and 8# weight gain in the last 5-6 weeks.  BP today was 138/80. She denies orthopnea or PND.  She had an appointment for today but had to cancel due to transportation issues.  New appt with Corine ShelterLuke Kilroy, PA on 1/17.  Last EKG Showed NSR.  I told her to go ahead and stop the Amiodarone and FU as scheduled.  Youssouf Shipley, PAC

## 2015-07-03 ENCOUNTER — Telehealth: Payer: Self-pay | Admitting: Cardiovascular Disease

## 2015-07-03 NOTE — Telephone Encounter (Signed)
Pt states she has been having increasing shortness of breath since yesterday. Pt states she woke up in the middle of the night short of breath, her heart fluttering. Pt did not check her BP or heart rate at that time.  Pt states she does have a cold now.  Pt states her BP earlier this morning was 146/78 and heart rate 76. Pt states BP right now 89/60, heart rate 76, she is lightheaded, short of breath, having discomfort in her chest when she takes a deep breath.  Pt rechecked her BP while on the phone with me and reported BP of 70/40. I advised pt to hang up phone and call 911 for transport to ED.  Pt is asking to come to office for evaluation. Pt advised she should hang up telephone call 911 for transport to ED. Pt states she will call someone to take her to ED. Pt advised she should not drive.  Pt advised she should call 911 for transport to ED.

## 2015-07-03 NOTE — Telephone Encounter (Signed)
New message  Patient c/o AFIB:  High priority if patient c/o lightheadedness and shortness of breath.  1. How long have you been having palpitations? AFIB off and on since the attack on 05/02/2015  2. Are you currently experiencing lightheadedness and shortness of breath? YES   3. Have you checked your BP and heart rate? (document readings)   07/02/2015 136/78 Pulse 76  4. Are you experiencing any other symptoms? Chest pain and headaches.    Pt c/o of Chest Pain:  1. Are you having CP right now? Not really  2. Are you experiencing any other symptoms (ex. SOB, nausea, vomiting, sweating)? Yes. Not really sweating that bad. Its normal. SOB tightness in chest and fatigue can barley walk.  3. How long have you been experiencing CP? One day last week she had a really sharp pain. That was the worst but she has them periodically every other day.  4. Is your CP continuous or coming and going? Coming and going  5. Have you taken Nitroglycerin? No    Comments: pt is not sure that if the shaking is due to her parkinson's.

## 2015-07-04 ENCOUNTER — Ambulatory Visit (INDEPENDENT_AMBULATORY_CARE_PROVIDER_SITE_OTHER): Payer: Medicare Other | Admitting: Cardiology

## 2015-07-04 ENCOUNTER — Encounter: Payer: Self-pay | Admitting: Cardiology

## 2015-07-04 VITALS — BP 110/78 | HR 64 | Ht 65.0 in | Wt 145.0 lb

## 2015-07-04 DIAGNOSIS — R079 Chest pain, unspecified: Secondary | ICD-10-CM

## 2015-07-04 DIAGNOSIS — I480 Paroxysmal atrial fibrillation: Secondary | ICD-10-CM | POA: Diagnosis not present

## 2015-07-04 DIAGNOSIS — F329 Major depressive disorder, single episode, unspecified: Secondary | ICD-10-CM | POA: Insufficient documentation

## 2015-07-04 DIAGNOSIS — F418 Other specified anxiety disorders: Secondary | ICD-10-CM | POA: Diagnosis not present

## 2015-07-04 DIAGNOSIS — Z7901 Long term (current) use of anticoagulants: Secondary | ICD-10-CM

## 2015-07-04 DIAGNOSIS — G20 Parkinson's disease: Secondary | ICD-10-CM

## 2015-07-04 DIAGNOSIS — F419 Anxiety disorder, unspecified: Secondary | ICD-10-CM

## 2015-07-04 DIAGNOSIS — F32A Depression, unspecified: Secondary | ICD-10-CM

## 2015-07-04 NOTE — Progress Notes (Signed)
07/04/2015 Sharon Jenkins   [birth year]  [id number]  Primary Physician Redmond Baseman, MD Primary Cardiologist: Dr Sanjuana Kava [name]  HPI:  65 y/o widowed female, moved here a year ago from [place] and now lives near her daughter. She has a history of depression and Parkinsons. We say her in Nov 2016 with AF. She apparently has a history of PAF in 2014 that converted spontaneously. In NOv she was started on Amiodarone and Eliquis (CHADs VASc=1). When she was seen in the AF clinic two weeks later she was in NSR. Since being on Amiodarone she has complained of a list of side effects- blurred vision, weakness, increased tremors, fatigue, and wgt gain. She actually asked about being switched to [facility], apparently her sister takes this. She called the service 06/20/15 and was instructed to stop Amiodarone and f/u in the clinic. She couldn't keep that appointment and is here today for follow up. She says she believes she feels better off Amiodarone. She has a URI currently and has had some localized Rt sided chest pain that sounds non cardiac but otherwise no complaints of tachycardia. She is still in NSR.    Current Outpatient Prescriptions  Medication Sig Dispense Refill  . apixaban (ELIQUIS) 5 MG TABS tablet Take 1 tablet (5 mg total) by mouth 2 (two) times daily. 60 tablet 6  . b complex vitamins capsule Take 1 capsule by mouth daily.     Marland Kitchen CALCIUM & MAGNESIUM CARBONATES PO Take by mouth.    . Calcium Carbonate-Vit D-Min (CALCIUM 600+D3 PLUS MINERALS PO) Take 1 tablet by mouth daily.    . calcium citrate (CALCITRATE - DOSED IN MG ELEMENTAL CALCIUM) 950 MG tablet Take 600 mg of elemental calcium by mouth daily.    . carbidopa-levodopa (SINEMET IR) 25-100 MG per tablet Take 1.5 tablets by mouth every 3 (three) hours.     . clonazePAM (KLONOPIN) 0.5 MG tablet Take 0.25-0.5 mg by mouth 2 (two) times daily. Take 1/2 tablet by moyth at noon and take 1 tablet by mouth at bedtime    .  Multiple Vitamin (MULTIVITAMINS PO) Take 1 tablet by mouth daily.    . Polyvinyl Alcohol-Povidone (REFRESH OP) Apply 1 drop to eye as needed (dry eyes).    . Probiotic Product (PROBIOTIC PO) Take 1 tablet by mouth daily.    Marland Kitchen rOPINIRole (REQUIP XL) 8 MG 24 hr tablet Take 8 mg by mouth daily.     . sertraline (ZOLOFT) 50 MG tablet Take 100 mg by mouth daily.     . timolol (BETIMOL) 0.25 % ophthalmic solution Place 1 drop into both eyes daily. FOR DRY EYES     No current facility-administered medications for this visit.    Allergies  Allergen Reactions  . Lidocaine Hives    Social History   Social History  . Marital Status: Widowed    Spouse Name: N/A  . Number of Children: N/A  . Years of Education: N/A   Occupational History  . Not on file.   Social History Main Topics  . Smoking status: Never Smoker   . Smokeless tobacco: Never Used  . Alcohol Use: No  . Drug Use: No  . Sexual Activity: Not Currently   Other Topics Concern  . Not on file   Social History Narrative     Review of Systems: General: negative for chills, fever, night sweats or weight changes.  Cardiovascular: negative for chest pain, dyspnea on exertion, edema, orthopnea, palpitations, paroxysmal nocturnal  dyspnea or shortness of breath Dermatological: negative for rash Respiratory: negative for cough or wheezing Urologic: negative for hematuria Abdominal: negative for nausea, vomiting, diarrhea, bright red blood per rectum, melena, or hematemesis Neurologic: negative for visual changes, syncope, or dizziness All other systems reviewed and are otherwise negative except as noted above.    Blood pressure 110/78, pulse 64, height  (1.651 m), weight 145 lb (65.772 kg).  General appearance: alert, cooperative and no distress Neck: no carotid bruit and no JVD Lungs: few crackles Rt base Heart: regular rate and rhythm Extremities: no edema Skin: Skin color, texture, turgor normal. No rashes or  lesions Neurologic: Grossly normal fine resting tremor, ambulates with a rolling walker  EKG NSR-68. QTc 465  Echo Nov 2016 Study Conclusions  - Left ventricle: The cavity size was normal. Wall thickness was increased in a pattern of mild LVH. Systolic function was normal. The estimated ejection fraction was in the range of 50% to 55%. Wall motion was normal; there were no regional wall motion abnormalities. Features are consistent with a pseudonormal left ventricular filling pattern, with concomitant abnormal relaxation and increased filling pressure (grade 2 diastolic dysfunction). - Mitral valve: There was mild regurgitation. - Left atrium: The atrium was mildly dilated.  ASSESSMENT AND PLAN:   Parkinson disease (HCC) Uses a rolling walker  Anxiety and depression Sees a psychiatrist- on Zoloft 100 mg daily, and Klonopin  Anticoagulated Eliquis  PAF (paroxysmal atrial fibrillation) (HCC) First documented in 2014- converted spontaneously to NSR. Presented again in Nov 2016- Amio and Eliquis started- NSR on 2 week f/u CHADs VASc=1 Now off Amiodarone secondary to side effects.    PLAN  I spoke with [name] today. She will see the pt in two weeks, that will be a full month off Amiodarone. Further recommendation to be made at that time. I did tell the pt she could take regular Mucinex prn for her cough.   Juli Odom K PA-C 07/04/2015 10:24 AM

## 2015-07-04 NOTE — Assessment & Plan Note (Signed)
Eliquis 

## 2015-07-04 NOTE — Patient Instructions (Addendum)
Medication Instructions:   CONTINUE SAME MEDICATIONS  If you need a refill on your cardiac medications before your next appointment, please call your pharmacy.  Labwork: NONE ORDER TODAY    Testing/Procedures:  NONE ORDER TODAY    Follow-Up: IN 2 WEEKS AFIB CLINIC  WITH DONNA  CARROLL   Any Other Special Instructions Will Be Listed Below (If Applicable).

## 2015-07-04 NOTE — Assessment & Plan Note (Signed)
Uses a rolling walker

## 2015-07-04 NOTE — Assessment & Plan Note (Signed)
First documented in 2014- converted spontaneously to NSR. Presented again in Nov 2016- Amio and Eliquis started- NSR on 2 week f/u CHADs VASc=1 Now off Amiodarone secondary to side effects.

## 2015-07-04 NOTE — Assessment & Plan Note (Signed)
Sees a psychiatrist- on Zoloft 100 mg daily, and Klonopin

## 2015-07-19 ENCOUNTER — Encounter (HOSPITAL_COMMUNITY): Payer: Self-pay | Admitting: Nurse Practitioner

## 2015-07-19 ENCOUNTER — Ambulatory Visit (HOSPITAL_COMMUNITY)
Admission: RE | Admit: 2015-07-19 | Discharge: 2015-07-19 | Disposition: A | Discharge status: Home / Self Care | Payer: Medicare Other | Source: Ambulatory Visit | Attending: Nurse Practitioner | Admitting: Nurse Practitioner

## 2015-07-19 VITALS — BP 102/72 | HR 69 | Ht 65.0 in | Wt 148.6 lb

## 2015-07-19 DIAGNOSIS — I4819 Other persistent atrial fibrillation: Secondary | ICD-10-CM

## 2015-07-19 DIAGNOSIS — I481 Persistent atrial fibrillation: Secondary | ICD-10-CM | POA: Diagnosis present

## 2015-07-19 NOTE — Progress Notes (Signed)
Patient ID: Sharon Jenkins, female   DOB: January 13, 1951, 65 y.o.   MRN: [id number]     Primary Care Physician: Redmond Baseman, MD Referring Physician: [facility] F/U   Sharon Jenkins is a 65 y.o. female with a h/o Parkinson's,PAF, dx in 2014, who presented to [facility] ED on 05/02/2015 from her PCP for atrial fibrillation with RVR.  She had began feeling weak and fatigued starting earlier that day and went to her PCP for evaluation where an EKG confirmed she was in atrial fibrillation. She was unaware of her tachycardiac rate and denied any palpitations, chest pain, or shortness of breath. She reported having been in the rhythm in 2014 but spontaneously converted back to NSR without requiring medications or DCCV.  In the ED, she was started on a Cardizem drip but was unable to tolerate the medication secondary to hypotension. She was therefore started on IV Amiodarone instead. She was started on Eliquis for anticoagulation.  Overnight, she converted to NSR. Her HR was in the 60's - 70's at time of examination on 05/03/2015. She reported feeling well and said her weakness and fatigue from the previous day had resolved. Her IV Amiodarone was switched to  PO BID. She ambulated over 300 feet with physical therapy using her walker and was able to climb 10 stairs. Physical Therapy recommended home health PT and this has been arranged by the case manager.  She  followed-up in the Atrial Fibrillation clinic on 05/09/2015,  and reported  that she was not tolerating the amiodarone load very well. She has been having a lot of shaking of her hands and feet and not sleeping well. She tried taking just 200 mg amiodarone one day and felt like her usual self. She is in SR today and has not felt any return of afib. She is tolerating blood thinner without issues.   She denies drinking any caffeine, alcohol. She is not overweight and she does water aerobics and yoga several times a week. She did have a sleep study in  the [place] area 1-2 years, which was positive for sleep apnea but never started due to fear of the mask. Echo showed normal ef with a grade 2 diastolic dysfunction and mildly dilated left atrium.  On f/u visit, she reported that she was doing better on the amiodarone dose of 200 mg a day. She was having a lot of shaking and flapping of her arms/ feet and called to the office several days after being seen in clinic and wondered if she needed to go to the ER, thought symptoms  were from amiodarone. I asked her to call her neurologist and they did not feel any adjustment of her parkinson drugs needed to be made. She recently saw her psychiatrist and she thought that her symptoms were from panic attacks, increased her zoloft and clonazepam. She then went to ER for weakness and it was thought to be from increase of zoloft. At that visit, she felt better, her outlook was better and she seemed more relaxed. She has not noticed any further afib. No further uncontrolled shaking of hands and feet. She would however, like to switch to another antiarrythmic in the future. Her sister is on [place] and she would like to be considered for this drug after the first of the year. She is aware that she will have to stop drug and allow for washout which make take 4-8 weeks. And she is on an antidepressant which may tiksoyn not a viable option.  She is participating in a spin class on a regular basis and has not had any issues retuning to class on amiodarone. Has not noticed any further afib.   She then had worsening symptoms of tremors, weakness, blurry vision and amiodarone was stopped 1/3.  IIn the afib clinic today to discuss options. Pt is feeling better and the plan made earlier with [facility] was to get an amiodarone level today and discuss tikosyn. However, has not had  any afib issues for months and at this point may be best to take a wait and see approach, especially since qtc at baseline is around 440 ms and she is  on an antidepressant which the pt may not tolerate stopping.   Today, she denies symptoms of palpitations, chest pain, shortness of breath, orthopnea, PND, lower extremity edema, dizziness, presyncope, syncope, or neurologic sequela. The patient is tolerating medications without difficulties and is otherwise without complaint today.   Past Medical History  Diagnosis Date  . Spinal stenosis of lumbar region   . Parkinson disease (HCC) dx'd ~ 2009  . DVT (deep venous thrombosis) (HCC)     "don't remember which side"  . Asthma   . Walking pneumonia X 1  . Chronic bronchitis (HCC)     "I'm good for a case a year" (05/02/2015)  . Sleep apnea     "I chose not to wear a mask" (05/02/2015)  . Anxiety   . Depression    Past Surgical History  Procedure Laterality Date  . Lumbar disc arthroplasty  1991  . Knee arthroscopy Left     "meniscus tear"  . Back surgery    . Abdominal hysterectomy  ~ 2006  . Dilation and curettage of uterus  1974  . Tubal ligation  05/1978  . Cataract extraction w/ intraocular lens  implant, bilateral Bilateral 1998  . Genioplasty  ~ 1610-9604    "placed screw and bone matter in my chin & brought it forward"  . Varicose vein surgery Right 1987    Current Outpatient Prescriptions  Medication Sig Dispense Refill  . apixaban (ELIQUIS) 5 MG TABS tablet Take 1 tablet (5 mg total) by mouth 2 (two) times daily. 60 tablet 6  . b complex vitamins capsule Take 1 capsule by mouth daily.     Marland Kitchen CALCIUM & MAGNESIUM CARBONATES PO Take by mouth.    . Calcium Carbonate-Vit D-Min (CALCIUM 600+D3 PLUS MINERALS PO) Take 1 tablet by mouth daily.    . calcium citrate (CALCITRATE - DOSED IN MG ELEMENTAL CALCIUM) 950 MG tablet Take 600 mg of elemental calcium by mouth daily.    . carbidopa-levodopa (SINEMET IR) 25-100 MG per tablet Take 1.5 tablets by mouth every 3 (three) hours.     . clonazePAM (KLONOPIN) 0.5 MG tablet Take 0.25-0.5 mg by mouth 2 (two) times daily. Take 1/2 tablet  by moyth at noon and take 1 tablet by mouth at bedtime    . Multiple Vitamin (MULTIVITAMINS PO) Take 1 tablet by mouth daily.    . Polyvinyl Alcohol-Povidone (REFRESH OP) Apply 1 drop to eye as needed (dry eyes).    . Probiotic Product (PROBIOTIC PO) Take 1 tablet by mouth daily.    Marland Kitchen rOPINIRole (REQUIP XL) 8 MG 24 hr tablet Take 8 mg by mouth daily.     . sertraline (ZOLOFT) 50 MG tablet Take 100 mg by mouth daily.     . timolol (BETIMOL) 0.25 % ophthalmic solution Place 1 drop into both eyes daily. FOR  DRY EYES     No current facility-administered medications for this encounter.    Allergies  Allergen Reactions  . Lidocaine Hives    Social History   Social History  . Marital Status: Widowed    Spouse Name: N/A  . Number of Children: N/A  . Years of Education: N/A   Occupational History  . Not on file.   Social History Main Topics  . Smoking status: Never Smoker   . Smokeless tobacco: Never Used  . Alcohol Use: No  . Drug Use: No  . Sexual Activity: Not Currently   Other Topics Concern  . Not on file   Social History Narrative    Family History  Problem Relation Age of Onset  . Heart attack Mother   . Hypertension Father   . Stroke Sister   . Stroke Father     ROS- All systems are reviewed and negative except as per the HPI above  Physical Exam: Filed Vitals:   07/19/15 0938  BP: 102/72  Pulse: 69  Height: 5\' 5"  (1.651 m)  Weight: 148 lb 9.6 oz (67.405 kg)    GEN- The patient is well appearing, alert and oriented x 3 today, with flat facial affect.   Head- normocephalic, atraumatic Eyes-  Sclera clear, conjunctiva pink Ears- hearing intact Oropharynx- clear Neck- supple, no JVP Lymph- no cervical lymphadenopathy Lungs- Clear to ausculation bilaterally, normal work of breathing Heart- Regular rate and rhythm, no murmurs, rubs or gallops, PMI not laterally displaced GI- soft, NT, ND, + BS Extremities- no clubbing, cyanosis, or edema MS- no  significant deformity or atrophy Skin- no rash or lesion Psych- euthymic mood, full affect Neuro- strength and sensation are intact  EKG- NSR  with short PR at 69 bpm, pr int 108 ms, qrs int 88 ms, qtc 443 ms. Epic records reviewed.   Echo-Left ventricle: The cavity size was normal. Wall thickness was increased in a pattern of mild LVH. Systolic function was normal. The estimated ejection fraction was in the range of 50% to 55%. Wall motion was normal; there were no regional wall motion abnormalities. Features are consistent with a pseudonormal left ventricular filling pattern, with concomitant abnormal relaxation and increased filling pressure (grade 2 diastolic dysfunction). - Mitral valve: There was mild regurgitation. - Left atrium: The atrium was mildly dilated.  Assessment and Plan: 1. Afib Off amiodarone x one month now and feeling better. No issues with afib Amiodarone level Continue apixaban for now with  a chadsvasc score of 1 She wanted to be considered for tikosyn, but her antidepressant use may be a problem with prolongation of QT potential and QTc at baseline is 440- 452 ms Ablation is also an option. Her son works in the [place] area as a device rep and if that is needed, her son would like her to have with EP that he works with in that area  F/u afib clinic as needed  Lupita Leash C. [facility] Fol[facility] [address] [place], [place] [address] [phone]

## 2015-07-20 LAB — AMIODARONE LEVEL
Amiodarone Lvl: 0.4 ug/mL — ABNORMAL LOW (ref 1.0–2.5)
N-DESETHYL-AMIODARONE: 0.3 ug/mL — AB (ref 1.0–2.5)

## 2015-09-12 ENCOUNTER — Ambulatory Visit (HOSPITAL_COMMUNITY): Payer: Medicare Other | Admitting: Nurse Practitioner

## 2015-10-04 ENCOUNTER — Other Ambulatory Visit: Payer: Self-pay

## 2015-10-04 DIAGNOSIS — Z1231 Encounter for screening mammogram for malignant neoplasm of breast: Secondary | ICD-10-CM

## 2015-10-13 ENCOUNTER — Emergency Department (HOSPITAL_COMMUNITY): Payer: Medicare Other

## 2015-10-13 ENCOUNTER — Encounter (HOSPITAL_COMMUNITY): Payer: Self-pay | Admitting: *Deleted

## 2015-10-13 ENCOUNTER — Telehealth: Payer: Self-pay | Admitting: Student

## 2015-10-13 ENCOUNTER — Emergency Department (HOSPITAL_COMMUNITY)
Admission: EM | Admit: 2015-10-13 | Discharge: 2015-10-13 | Disposition: A | Discharge status: Home / Self Care | Payer: Medicare Other | Attending: Emergency Medicine | Admitting: Emergency Medicine

## 2015-10-13 DIAGNOSIS — R079 Chest pain, unspecified: Secondary | ICD-10-CM | POA: Diagnosis present

## 2015-10-13 DIAGNOSIS — G20 Parkinson's disease: Secondary | ICD-10-CM | POA: Diagnosis not present

## 2015-10-13 DIAGNOSIS — Z8701 Personal history of pneumonia (recurrent): Secondary | ICD-10-CM | POA: Insufficient documentation

## 2015-10-13 DIAGNOSIS — F419 Anxiety disorder, unspecified: Secondary | ICD-10-CM | POA: Insufficient documentation

## 2015-10-13 DIAGNOSIS — Z79899 Other long term (current) drug therapy: Secondary | ICD-10-CM | POA: Diagnosis not present

## 2015-10-13 DIAGNOSIS — F329 Major depressive disorder, single episode, unspecified: Secondary | ICD-10-CM | POA: Diagnosis not present

## 2015-10-13 DIAGNOSIS — Z86718 Personal history of other venous thrombosis and embolism: Secondary | ICD-10-CM | POA: Insufficient documentation

## 2015-10-13 DIAGNOSIS — I480 Paroxysmal atrial fibrillation: Secondary | ICD-10-CM | POA: Insufficient documentation

## 2015-10-13 DIAGNOSIS — J45901 Unspecified asthma with (acute) exacerbation: Secondary | ICD-10-CM | POA: Diagnosis not present

## 2015-10-13 HISTORY — DX: Unspecified atrial fibrillation: I48.91

## 2015-10-13 LAB — BASIC METABOLIC PANEL
ANION GAP: 12 (ref 5–15)
BUN: 17 mg/dL (ref 6–20)
CALCIUM: 9.9 mg/dL (ref 8.9–10.3)
CO2: 24 mmol/L (ref 22–32)
Chloride: 105 mmol/L (ref 101–111)
Creatinine, Ser: 0.88 mg/dL (ref 0.44–1.00)
Glucose, Bld: 124 mg/dL — ABNORMAL HIGH (ref 65–99)
Potassium: 3.9 mmol/L (ref 3.5–5.1)
SODIUM: 141 mmol/L (ref 135–145)

## 2015-10-13 LAB — CBC
HCT: 41 % (ref 36.0–46.0)
HEMOGLOBIN: 13.8 g/dL (ref 12.0–15.0)
MCH: 30.8 pg (ref 26.0–34.0)
MCHC: 33.7 g/dL (ref 30.0–36.0)
MCV: 91.5 fL (ref 78.0–100.0)
Platelets: 256 10*3/uL (ref 150–400)
RBC: 4.48 MIL/uL (ref 3.87–5.11)
RDW: 13.3 % (ref 11.5–15.5)
WBC: 7 10*3/uL (ref 4.0–10.5)

## 2015-10-13 LAB — I-STAT TROPONIN, ED: TROPONIN I, POC: 0 ng/mL (ref 0.00–0.08)

## 2015-10-13 MED ORDER — METOPROLOL TARTRATE 25 MG PO TABS: 12.5000 mg | ORAL_TABLET | Freq: Two times a day (BID) | ORAL | Status: DC

## 2015-10-13 MED ORDER — SODIUM CHLORIDE 0.9 % IV BOLUS (SEPSIS)
1000.0000 mL | Freq: Once | INTRAVENOUS | Status: AC
Start: 2015-10-13 — End: 2015-10-13
  Administered 2015-10-13: 1000 mL via INTRAVENOUS

## 2015-10-13 NOTE — Telephone Encounter (Signed)
Patient called saying she is having chest discomfort, palpitations, and lightheadedness. Started last night, could not sleep, and persisted into the morning.  Her pulse rate this AM has ranged from 60's - 130's on her home monitor. BP has ranged from 65/40 - 74/59.   The patient was initially hesitant to come to the ED but I advised her we cannot send an outpatient Rx with her BP that low. I told her she should not drive herself to the ED. She does not want EMS transport. Her daughter lives nearby and will be transporting her.   [facility]   Signed, Ellsworth LennoxBrittany M Bartlomiej Jenkinson, PA-C 10/13/2015, 6:50 AM Pager: [phone]

## 2015-10-13 NOTE — ED Notes (Signed)
PA Sanders at bedside with patient and family.   

## 2015-10-13 NOTE — ED Notes (Signed)
Pt taken to XRay 

## 2015-10-13 NOTE — Discharge Instructions (Signed)
Take the prescribed medication as directed.  Do not take this medication if your systolic blood pressure (top number) is < 90. Continue all of your other medications as well. Follow-up with the AFIB clinic next week-- appt has been scheduled for you on 10/17/15 at 2:00pm. Return to the ED for new or worsening symptoms.

## 2015-10-13 NOTE — ED Notes (Addendum)
Pt reports onset yesterday of palpitations, sob and chest tightness. Hx of afib. afib at triage, HR 90-160.

## 2015-10-13 NOTE — ED Provider Notes (Signed)
CSN: [id number]     Arrival date & time 10/13/15  0753 History   First MD Initiated Contact with Patient 10/13/15 [phone]     Chief Complaint  Patient presents with  . Atrial Fibrillation  . Shortness of Breath  . Chest Pain     (Consider location/radiation/quality/duration/timing/severity/associated sxs/prior Treatment) Patient is a 65 y.o. female presenting with atrial fibrillation, shortness of breath, and chest pain. The history is provided by the patient and medical records.  Atrial Fibrillation Associated symptoms include chest pain.  Shortness of Breath Associated symptoms: chest pain   Chest Pain Associated symptoms: shortness of breath     65 year old female with history of Parkinson's disease, sleep apnea, anxiety, depression, A. fib, lumbar spinal stenosis, presenting to the ED for palpitations, shortness of breath, and chest tightness. Patient states this began yesterday morning while she was it's been class. She states she was not able to complete the class due to her symptoms. She went home to rest but symptoms persisted. She states around 6 PM last night symptoms worsened.  States she had a hard time sleeping due to her symptoms.  States this morning on her home BP monitor she noticed that her pulse was faster than normal so was concerned she may be in AFIB again.  She has history of same, currently on eliquis.  No other cardiac issues known.  Patient is not a smoker.  Followed by cardiology, Dr. Sanjuana Kava.  Past Medical History  Diagnosis Date  . Spinal stenosis of lumbar region   . Parkinson disease (HCC) dx'd ~ 2009  . DVT (deep venous thrombosis) (HCC)     "don't remember which side"  . Asthma   . Walking pneumonia X 1  . Chronic bronchitis (HCC)     "I'm good for a case a year" (05/02/2015)  . Sleep apnea     "I chose not to wear a mask" (05/02/2015)  . Anxiety   . Depression   . Atrial fibrillation [facility])    Past Surgical History  Procedure Laterality Date  .  Lumbar disc arthroplasty  1991  . Knee arthroscopy Left     "meniscus tear"  . Back surgery    . Abdominal hysterectomy  ~ 2006  . Dilation and curettage of uterus  1974  . Tubal ligation  05/1978  . Cataract extraction w/ intraocular lens  implant, bilateral Bilateral 1998  . Genioplasty  ~ 1191-4782    "placed screw and bone matter in my chin & brought it forward"  . Varicose vein surgery Right 1987   Family History  Problem Relation Age of Onset  . Heart attack Mother   . Hypertension Father   . Stroke Sister   . Stroke Father    Social History  Substance Use Topics  . Smoking status: Never Smoker   . Smokeless tobacco: Never Used  . Alcohol Use: No   OB History    No data available     Review of Systems  Respiratory: Positive for chest tightness and shortness of breath.   Cardiovascular: Positive for chest pain.  All other systems reviewed and are negative.     Allergies  Lidocaine  Home Medications   Prior to Admission medications   Medication Sig Start Date End Date Taking? Authorizing Provider  apixaban (ELIQUIS) 5 MG TABS tablet Take 1 tablet (5 mg total) by mouth 2 (two) times daily. 05/09/15   Newman Nip, NP  b complex vitamins capsule Take 1 capsule by mouth  daily.     Historical Provider, MD  CALCIUM & MAGNESIUM CARBONATES PO Take by mouth.    Historical Provider, MD  Calcium Carbonate-Vit D-Min (CALCIUM 600+D3 PLUS MINERALS PO) Take 1 tablet by mouth daily.    Historical Provider, MD  calcium citrate (CALCITRATE - DOSED IN MG ELEMENTAL CALCIUM) 950 MG tablet Take 600 mg of elemental calcium by mouth daily.    Historical Provider, MD  carbidopa-levodopa (SINEMET IR) 25-100 MG per tablet Take 1.5 tablets by mouth every 3 (three) hours.     Historical Provider, MD  clonazePAM (KLONOPIN) 0.5 MG tablet Take 0.25-0.5 mg by mouth 2 (two) times daily. Take 1/2 tablet by moyth at noon and take 1 tablet by mouth at bedtime    Historical Provider, MD   Multiple Vitamin (MULTIVITAMINS PO) Take 1 tablet by mouth daily.    Historical Provider, MD  Polyvinyl Alcohol-Povidone (REFRESH OP) Apply 1 drop to eye as needed (dry eyes).    Historical Provider, MD  Probiotic Product (PROBIOTIC PO) Take 1 tablet by mouth daily.    Historical Provider, MD  rOPINIRole (REQUIP XL) 8 MG 24 hr tablet Take 8 mg by mouth daily.     Historical Provider, MD  sertraline (ZOLOFT) 50 MG tablet Take 100 mg by mouth daily.     Historical Provider, MD  timolol (BETIMOL) 0.25 % ophthalmic solution Place 1 drop into both eyes daily. FOR DRY EYES    Historical Provider, MD   BP 116/72 mmHg  Pulse 94  Temp(Src) 98.1 F (36.7 C) (Oral)  Resp 18  SpO2 98%   Physical Exam  Constitutional: She is oriented to person, place, and time. She appears well-developed and well-nourished. No distress.  HENT:  Head: Normocephalic and atraumatic.  Mouth/Throat: Oropharynx is clear and moist.  Eyes: Conjunctivae and EOM are normal. Pupils are equal, round, and reactive to light.  Neck: Normal range of motion.  Cardiovascular: Normal rate, regular rhythm and normal heart sounds.   NSR  Pulmonary/Chest: Effort normal and breath sounds normal. No respiratory distress. She has no wheezes. She has no rhonchi. She has no rales.  Abdominal: Soft. Bowel sounds are normal.  Musculoskeletal: Normal range of motion.  Neurological: She is alert and oriented to person, place, and time.  Skin: Skin is warm and dry.  Psychiatric: She has a normal mood and affect.  Nursing note and vitals reviewed.   ED Course  Procedures (including critical care time) Labs Review Labs Reviewed  BASIC METABOLIC PANEL - Abnormal; Notable for the following:    Glucose, Bld 124 (*)    All other components within normal limits  CBC  I-STAT TROPOININ, ED    Imaging Review Dg Chest 2 View  10/13/2015  CLINICAL DATA:  Chest tightness, shortness of breath. Symptoms since yesterday EXAM: CHEST  2 VIEW  COMPARISON:  05/18/2015 FINDINGS: The heart size and mediastinal contours are within normal limits. Both lungs are clear. The visualized skeletal structures are unremarkable. IMPRESSION: No active cardiopulmonary disease. Electronically Signed   By: Charlett Nose M.D.   On: 10/13/2015 08:44   I have personally reviewed and evaluated these images and lab results as part of my medical decision-making.   EKG Interpretation   Date/Time:  Friday October 13 2015 09:31:14 EDT Ventricular Rate:  63 PR Interval:  121 QRS Duration: 95 QT Interval:  437 QTC Calculation: 447 R Axis:   -52 Text Interpretation:  Sinus rhythm Left anterior fascicular block Probable  anteroseptal infarct, old Rate  improved, no signs of acute ischemia  Confirmed by NGUYEN, EMILY ([id number]) on 10/13/2015 9:35:05 AM      MDM   Final diagnoses:  Paroxysmal atrial fibrillation (HCC)   65 year old female presenting to the ED in A. fib after palpitations, shortness of breath, chest tightness for the past 24 hours. By time of my evaluation, she has converted back to a normal sinus rhythm at rate in the 80s. She states her chest tightness and shortness of breath is improving. She denies any new symptoms. Her vital signs are stable on room air. Will proceed with cardiac workup.  Cardiac work-up reassuring.  EKG remains in NSR.  Patient reports she is feeling better, overall appearance improved.  Her BP has been slightly soft here in ED, between low 100's to 110's.  On review of patient's chart, this appears to be her baseline blood pressure as she has had multiple readings at office visits and other ED visits with similar pressures. Patient and family both confirmed that she has had issues with low blood pressure before, she monitors this closely at home.  Case has been discussed with cardiology-- PA [place], patient did not tolerate amiodarone well in the past so recommends low-dose Lopressor 12.5mg  BID with hold parameters for SBP <90.   Plan has been discussed with patient and family who are in agreement with this.  Follow-up in AFIB clinic next week on 10/17/15.  Discussed plan with patient, he/she acknowledged understanding and agreed with plan of care.  Return precautions given for new or worsening symptoms.  Garlon HatchetLisa M Traeton Bordas, PA-C 10/13/15 1448  Leta BaptistEmily Roe Nguyen, MD 10/26/15 [phone]

## 2015-10-17 ENCOUNTER — Inpatient Hospital Stay (HOSPITAL_COMMUNITY): Admit: 2015-10-17 | Payer: Medicare Other | Admitting: Nurse Practitioner

## 2015-10-18 ENCOUNTER — Ambulatory Visit (HOSPITAL_COMMUNITY)
Admission: RE | Admit: 2015-10-18 | Discharge: 2015-10-18 | Disposition: A | Discharge status: Home / Self Care | Payer: Medicare Other | Source: Ambulatory Visit | Attending: Nurse Practitioner | Admitting: Nurse Practitioner

## 2015-10-18 ENCOUNTER — Encounter (HOSPITAL_COMMUNITY): Payer: Self-pay | Admitting: Nurse Practitioner

## 2015-10-18 VITALS — BP 108/60 | HR 65 | Ht 65.0 in | Wt 146.0 lb

## 2015-10-18 DIAGNOSIS — G4730 Sleep apnea, unspecified: Secondary | ICD-10-CM | POA: Diagnosis not present

## 2015-10-18 DIAGNOSIS — Z7901 Long term (current) use of anticoagulants: Secondary | ICD-10-CM | POA: Diagnosis not present

## 2015-10-18 DIAGNOSIS — I480 Paroxysmal atrial fibrillation: Secondary | ICD-10-CM

## 2015-10-18 DIAGNOSIS — Z823 Family history of stroke: Secondary | ICD-10-CM | POA: Diagnosis not present

## 2015-10-18 DIAGNOSIS — Z86718 Personal history of other venous thrombosis and embolism: Secondary | ICD-10-CM | POA: Diagnosis not present

## 2015-10-18 DIAGNOSIS — F329 Major depressive disorder, single episode, unspecified: Secondary | ICD-10-CM | POA: Diagnosis not present

## 2015-10-18 DIAGNOSIS — Z8249 Family history of ischemic heart disease and other diseases of the circulatory system: Secondary | ICD-10-CM | POA: Insufficient documentation

## 2015-10-18 DIAGNOSIS — G20 Parkinson's disease: Secondary | ICD-10-CM | POA: Diagnosis not present

## 2015-10-18 DIAGNOSIS — Z79899 Other long term (current) drug therapy: Secondary | ICD-10-CM | POA: Diagnosis not present

## 2015-10-18 DIAGNOSIS — F419 Anxiety disorder, unspecified: Secondary | ICD-10-CM | POA: Diagnosis not present

## 2015-10-18 DIAGNOSIS — I4891 Unspecified atrial fibrillation: Secondary | ICD-10-CM | POA: Diagnosis present

## 2015-10-18 NOTE — Progress Notes (Signed)
Patient ID: Sharon Jenkins, female   DOB: Aug 31, 1950, 65 y.o.   MRN: [id number] Patient ID: Sharon Jenkins, female   DOB: 02-Dec-1950, 65 y.o.   MRN: [id number]     Primary Care Physician: Redmond Baseman, MD Referring Physician: [facility] F/U   Sharon Jenkins is a 65 y.o. female with a h/o Parkinson's,PAF, dx in 2014, who presented to [facility] ED on 05/02/2015 from her PCP for atrial fibrillation with RVR.  She had began feeling weak and fatigued starting earlier that day and went to her PCP for evaluation where an EKG confirmed she was in atrial fibrillation. She was unaware of her tachycardiac rate and denied any palpitations, chest pain, or shortness of breath. She reported having been in the rhythm in 2014 but spontaneously converted back to NSR without requiring medications or DCCV.  In the ED, she was started on a Cardizem drip but was unable to tolerate the medication secondary to hypotension. She was therefore started on IV Amiodarone instead. She was started on Eliquis for anticoagulation.  Overnight, she converted to NSR. Her HR was in the 60's - 70's at time of examination on 05/03/2015. She reported feeling well and said her weakness and fatigue from the previous day had resolved. Her IV Amiodarone was switched to  PO BID. She ambulated over 300 feet with physical therapy using her walker and was able to climb 10 stairs. Physical Therapy recommended home health PT and this has been arranged by the case manager.  She  followed-up in the Atrial Fibrillation clinic on 05/09/2015,  and reported  that she was not tolerating the amiodarone load very well. She has been having a lot of shaking of her hands and feet and not sleeping well. She tried taking just 200 mg amiodarone one day and felt like her usual self. She is in SR today and has not felt any return of afib. She is tolerating blood thinner without issues.   She denies drinking any caffeine, alcohol. She is not overweight and she  does water aerobics and yoga several times a week. She did have a sleep study in the [place] area 1-2 years, which was positive for sleep apnea but never started due to fear of the mask. Echo showed normal ef with a grade 2 diastolic dysfunction and mildly dilated left atrium.  On f/u visit, she reported that she was doing better on the amiodarone dose of 200 mg a day. She was having a lot of shaking and flapping of her arms/ feet and called to the office several days after being seen in clinic and wondered if she needed to go to the ER, thought symptoms  were from amiodarone. I asked her to call her neurologist and they did not feel any adjustment of her parkinson drugs needed to be made. She recently saw her psychiatrist and she thought that her symptoms were from panic attacks, increased her zoloft and clonazepam. She then went to ER for weakness and it was thought to be from increase of zoloft. At that visit, she felt better, her outlook was better and she seemed more relaxed. She has not noticed any further afib. No further uncontrolled shaking of hands and feet. She would however, like to switch to another antiarrythmic in the future. Her sister is on [place] and she would like to be considered for this drug after the first of the year. She is aware that she will have to stop drug and allow for washout which make  take 4-8 weeks. And she is on an antidepressant which may tiksoyn not a viable option. She is participating in a spin class on a regular basis and has not had any issues retuning to class on amiodarone. Has not noticed any further afib.   She then had worsening symptoms of tremors, weakness, blurry vision and amiodarone was stopped 1/317.  In the afib clinic today to discuss options. Pt is feeling better and the plan made earlier with [facility] was to get an amiodarone level today and discuss tikosyn. However, has not had  any afib issues for months and at this point may be best to take a  wait and see approach, especially since qtc at baseline is around 440 ms and she is on an antidepressant which the pt may not tolerate stopping.   Returns to afib clinic today 5/3, after having a ER visit for afib associated with hypotension, RVR, chest pain, presyncope. She states these are her usual symptoms when she goes into afib, with BP making it hard to give extra rate control in an outpt setting. She returned to SR on hr own in the ER. She was started on metoprolol and seems to be tolerating and no further afib.    Today, she denies symptoms of palpitations, chest pain, shortness of breath, orthopnea, PND, lower extremity edema, dizziness, presyncope, syncope, or neurologic sequela. The patient is tolerating medications without difficulties and is otherwise without complaint today.   Past Medical History  Diagnosis Date  . Spinal stenosis of lumbar region   . Parkinson disease (HCC) dx'd ~ 2009  . DVT (deep venous thrombosis) (HCC)     "don't remember which side"  . Asthma   . Walking pneumonia X 1  . Chronic bronchitis (HCC)     "I'm good for a case a year" (05/02/2015)  . Sleep apnea     "I chose not to wear a mask" (05/02/2015)  . Anxiety   . Depression   . Atrial fibrillation [facility](HCC)    Past Surgical History  Procedure Laterality Date  . Lumbar disc arthroplasty  1991  . Knee arthroscopy Left     "meniscus tear"  . Back surgery    . Abdominal hysterectomy  ~ 2006  . Dilation and curettage of uterus  1974  . Tubal ligation  05/1978  . Cataract extraction w/ intraocular lens  implant, bilateral Bilateral 1998  . Genioplasty  ~ [id number]    "placed screw and bone matter in my chin & brought it forward"  . Varicose vein surgery Right 1987    Current Outpatient Prescriptions  Medication Sig Dispense Refill  . apixaban (ELIQUIS) 5 MG TABS tablet Take 1 tablet (5 mg total) by mouth 2 (two) times daily. 60 tablet 6  . CALCIUM & MAGNESIUM CARBONATES PO Take by mouth.    .  Calcium Carbonate-Vit D-Min (CALCIUM 600+D3 PLUS MINERALS PO) Take 1 tablet by mouth daily.    . calcium citrate (CALCITRATE - DOSED IN MG ELEMENTAL CALCIUM) 950 MG tablet Take 600 mg of elemental calcium by mouth daily.    . carbidopa-levodopa (SINEMET IR) 25-100 MG per tablet Take 1.5 tablets by mouth every 3 (three) hours.     . clonazePAM (KLONOPIN) 0.5 MG tablet Take 0.25-0.5 mg by mouth 2 (two) times daily. Take 1/2 tablet by moyth at noon and take 1 tablet by mouth at bedtime    . Hypromellose (GENTEAL MILD) 0.2 % SOLN Apply 1 drop to eye daily.    .Marland Kitchen  metoprolol (LOPRESSOR) 25 MG tablet Take 0.5 tablets (12.5 mg total) by mouth 2 (two) times daily. 30 tablet 0  . Multiple Vitamin (MULTIVITAMINS PO) Take 1 tablet by mouth daily.    . Probiotic Product (PROBIOTIC PO) Take 1 tablet by mouth daily.    Marland Kitchen rOPINIRole (REQUIP XL) 8 MG 24 hr tablet Take 8 mg by mouth daily.     . sertraline (ZOLOFT) 50 MG tablet Take 100 mg by mouth daily.     . timolol (BETIMOL) 0.25 % ophthalmic solution Place 1 drop into both eyes daily. FOR DRY EYES    . Polyvinyl Alcohol-Povidone (REFRESH OP) Apply 1 drop to eye as needed (dry eyes).     No current facility-administered medications for this encounter.    Allergies  Allergen Reactions  . Lidocaine Hives    Social History   Social History  . Marital Status: Widowed    Spouse Name: N/A  . Number of Children: N/A  . Years of Education: N/A   Occupational History  . Not on file.   Social History Main Topics  . Smoking status: Never Smoker   . Smokeless tobacco: Never Used  . Alcohol Use: No  . Drug Use: No  . Sexual Activity: Not Currently   Other Topics Concern  . Not on file   Social History Narrative    Family History  Problem Relation Age of Onset  . Heart attack Mother   . Hypertension Father   . Stroke Sister   . Stroke Father     ROS- All systems are reviewed and negative except as per the HPI above  Physical Exam: Filed  Vitals:   10/18/15 0915  BP: 108/60  Pulse: 65  Height: 5\' 5"  (1.651 m)  Weight: 146 lb (66.225 kg)    GEN- The patient is well appearing, alert and oriented x 3 today, with flat facial affect.   Head- normocephalic, atraumatic Eyes-  Sclera clear, conjunctiva pink Ears- hearing intact Oropharynx- clear Neck- supple, no JVP Lymph- no cervical lymphadenopathy Lungs- Clear to ausculation bilaterally, normal work of breathing Heart- regular rate and rhythm, no murmurs, rubs or gallops, PMI not laterally displaced GI- soft, NT, ND, + BS Extremities- no clubbing, cyanosis, or edema MS- no significant deformity or atrophy Skin- no rash or lesion Psych- euthymic mood, full affect Neuro- strength and sensation are intact  EKG- NSR  At 65 bpm, pr int 116 ms, qrs int 78 ms, qtc 432 ms Epic records reviewed.   Echo-Left ventricle: The cavity size was normal. Wall thickness was increased in a pattern of mild LVH. Systolic function was normal. The estimated ejection fraction was in the range of 50% to 55%. Wall motion was normal; there were no regional wall motion abnormalities. Features are consistent with a pseudonormal left ventricular filling pattern, with concomitant abnormal relaxation and increased filling pressure (grade 2 diastolic dysfunction). - Mitral valve: There was mild regurgitation. - Left atrium: The atrium was mildly dilated.  Assessment and Plan: 1. PAF Off amiodarone due to side effects Continue apixaban for now with  a chadsvasc score of 1 She wanted to be considered for tikosyn, but her antidepressant use may be a problem with prolongation of QT potential and QTc at baseline is 440- 452 ms Ablation is also an option. Her son works in the [place] area as a device rep and if that is needed, her son would like her to have with EP that he works with in that area,  however the pt states that she rather have this done local and stay in her own home Right  now, she is tolerating BB and wants to see how long this may keep her in rhtyhm We discussed how to use an extra 1/2 tab of metoprolol for breakthrough afib,if her blood pressure is sufficient to tolerate higher dose. She typically will run a blood pressure of 80 when in afib.  F/u afib clinic in two months, sooner if needed  Sharon Jenkins C. [facility] [address] [place], [place] [address] [phone]

## 2015-10-18 NOTE — Patient Instructions (Signed)
May take extra half tablet of metoprolol for afib if heart rate is greater than 100 as long as your blood pressure on the top is between 90-100 or greater.   You can always call the after hours physician at [phone] if questions of whether you should take the extra metoprolol or not.

## 2015-10-27 ENCOUNTER — Ambulatory Visit (HOSPITAL_COMMUNITY): Payer: Medicare Other | Admitting: Nurse Practitioner

## 2015-11-02 ENCOUNTER — Telehealth (HOSPITAL_COMMUNITY): Payer: Self-pay | Admitting: *Deleted

## 2015-11-02 MED ORDER — METOPROLOL TARTRATE 25 MG PO TABS: ORAL_TABLET | ORAL | Status: DC

## 2015-11-02 NOTE — Telephone Encounter (Signed)
Patient called in concerned regarding her metoprolol. She took her BP this morning prior to medications and it was 109/74 HR 60s so she took her metoprolol 12.5mg . Went to the gym for spin class and once home started feeling badly checked her BP 74/48. Discussed with Rudi Cocoonna Carroll NP will discontinue daily metoprolol and change to only as needed for afib HR >100 as long as SBP>100. Patient instructed to drink extra water today to help increase blood pressure as well as fall precautions until feeling better. Patient verbalized understanding.

## 2015-11-03 ENCOUNTER — Ambulatory Visit: Payer: Medicare Other

## 2015-11-03 ENCOUNTER — Telehealth (HOSPITAL_COMMUNITY): Payer: Self-pay | Admitting: *Deleted

## 2015-11-03 NOTE — Telephone Encounter (Signed)
Patient called into today stating she is continuing to have difficulty with hypotension. She called her neurologist in regards to this since off her metoprolol for over 24 hours and they suggested performing orthostatics. She did this at home and BP went from 104/60 lying to 74/48 standing.  Patient states she feels extreme lightheaded with this. Patient refuses to see her PCP as she does not trust them. Her heart rate is controlled is not having any afib. Discussed with Rudi Cocoonna Carroll NP recommended appointment with general cardiologist/flex PA for management. Instructed patient to attempt to stay well hydrated, fall precautions. Appt was made with flex PA for 5/24 @ 330pm patient was informed of appointment. Instructions on when to present to ER were reviewed and patient verbalized understanding.

## 2015-11-08 ENCOUNTER — Ambulatory Visit: Payer: Medicare Other | Admitting: Cardiology

## 2015-11-14 ENCOUNTER — Telehealth: Payer: Self-pay | Admitting: Cardiovascular Disease

## 2015-11-14 NOTE — Progress Notes (Signed)
Cardiology Office Note   Date:  11/15/2015   ID:  DORLEEN KISSEL, DOB December 21, 1950, MRN [id number]  PCP:  Redmond Baseman, MD  Cardiologist:  Dr. Theo Dills, NP    Chief Complaint  Patient presents with  . Hypotension    BP 80 systolic      History of Present Illness: Sharon Jenkins is a 65 y.o. female who presents for hypotension.  It seems usually with hypotension she has been in a fib.  But not so today.  She has been hypotensive and no a fib since the 18th.  Worse after exercise, increases with standing.  It has been as low as 67/49.  When this low she sits or rests in recliner and drinks water,  She rarely drinks caffeine.  Does not eat much salty foods.  She has support stockings but not wearing them.  Yesterday with hot bath felt she may pass out - has now been instructed to not take hot showers.  With the low BP her mentation has not been good.    She has a history of depression and Parkinsons. Was seen here in Nov 2016 with AF. She apparently has a history of PAF in 2014 that converted spontaneously. In Nov she was started on Amiodarone and Eliquis (CHADs VASc=1). When she was seen in the AF clinic two weeks later she was in NSR. Since being on Amiodarone she has complained of a list of side effects- blurred vision, weakness, increased tremors, fatigue, and wgt gain. She actually asked about being switched to [facility], apparently her sister takes this. She called the service 06/20/15 and was instructed to stop Amiodarone and f/u in the clinic.  She says she believes she feels better off Amiodarone.   And she is on an antidepressant which may tiksoyn not a viable option. She is participating in a spin class on a regular basis and has not had any issues retuning to class on amiodarone. Has not noticed any further afib.   She then had worsening symptoms of tremors, weakness, blurry vision and amiodarone was stopped 06/20/15. In the afib clinic and discussed options. Pt wass  feeling better and the plan made earlier with [facility] was to get an amiodarone level and discuss tikosyn. However, has not had any afib issues for months and at this point may be best to take a wait and see approach, especially since qtc at baseline is around 440 ms and she is on an antidepressant which the pt may not tolerate stopping  Also, ER visit for afib associated with hypotension 09/2015, RVR, chest pain, presyncope. She states these are her usual symptoms when she goes into afib, with BP making it hard to give extra rate control in an outpt setting.  She is no longer using lopressor and she has had one episode of tachycardia but this may have been ST with hypotension.  No chest pain and no SOB.   Past Medical History  Diagnosis Date  . Spinal stenosis of lumbar region   . Parkinson disease (HCC) dx'd ~ 2009  . DVT (deep venous thrombosis) (HCC)     "don't remember which side"  . Asthma   . Walking pneumonia X 1  . Chronic bronchitis (HCC)     "I'm good for a case a year" (05/02/2015)  . Sleep apnea     "I chose not to wear a mask" (05/02/2015)  . Anxiety   . Depression   . Atrial fibrillation (HCC)  Past Surgical History  Procedure Laterality Date  . Lumbar disc arthroplasty  1991  . Knee arthroscopy Left     "meniscus tear"  . Back surgery    . Abdominal hysterectomy  ~ 2006  . Dilation and curettage of uterus  1974  . Tubal ligation  05/1978  . Cataract extraction w/ intraocular lens  implant, bilateral Bilateral 1998  . Genioplasty  ~ 4098-1191    "placed screw and bone matter in my chin & brought it forward"  . Varicose vein surgery Right 1987     Current Outpatient Prescriptions  Medication Sig Dispense Refill  . apixaban (ELIQUIS) 5 MG TABS tablet Take 1 tablet (5 mg total) by mouth 2 (two) times daily. 60 tablet 6  . CALCIUM & MAGNESIUM CARBONATES PO Take 1 tablet by mouth daily.     . Calcium Carbonate-Vit D-Min (CALCIUM 600+D3 PLUS MINERALS  PO) Take 1 tablet by mouth daily.    . calcium citrate (CALCITRATE - DOSED IN MG ELEMENTAL CALCIUM) 950 MG tablet Take 600 mg of elemental calcium by mouth daily.    . carbidopa-levodopa (SINEMET IR) 25-100 MG per tablet Take 1.5 tablets by mouth every 3 (three) hours.     . clonazePAM (KLONOPIN) 0.5 MG tablet Take 0.25-0.5 mg by mouth 2 (two) times daily. Take 1/2 tablet by moyth at noon and take 1 tablet by mouth at bedtime    . Multiple Vitamin (MULTIVITAMINS PO) Take 1 tablet by mouth daily.    . Polyvinyl Alcohol-Povidone (REFRESH OP) Place 1 drop into both eyes daily as needed (dry eyes).     Marland Kitchen rOPINIRole (REQUIP XL) 8 MG 24 hr tablet Take 8 mg by mouth daily.     . sertraline (ZOLOFT) 50 MG tablet Take 100 mg by mouth daily.      No current facility-administered medications for this visit.    Allergies:   Lidocaine    Social History:  The patient  reports that she has never smoked. She has never used smokeless tobacco. She reports that she does not drink alcohol or use illicit drugs.   Family History:  The patient's family history includes Heart attack in her mother; Hypertension in her father; Stroke in her father and sister.    ROS:  General:no colds or fevers, no weight changes Skin:no rashes or ulcers HEENT:no blurred vision, no congestion CV:see HPI PUL:see HPI GI:no diarrhea constipation or melena, no indigestion GU:no hematuria, no dysuria MS:no joint pain, no claudication Neuro:no syncope, no lightheadedness, + Parkinson's and with low BP decreased mentation.   Endo:no diabetes, no thyroid disease  Wt Readings from Last 3 Encounters:  11/15/15 147 lb 9.6 oz (66.951 kg)  10/18/15 146 lb (66.225 kg)  07/19/15 148 lb 9.6 oz (67.405 kg)     PHYSICAL EXAM: VS:  BP 80/52 mmHg  Pulse 74  Ht 5\' 6"  (1.676 m)  Wt 147 lb 9.6 oz (66.951 kg)  BMI 23.83 kg/m2  SpO2 94% , BMI Body mass index is 23.83 kg/(m^2). General:Pleasant affect, NAD Skin:Warm and dry, brisk  capillary refill HEENT:normocephalic, sclera clear, mucus membranes moist Neck:supple, no JVD, no bruits  Heart:S1S2 RRR without murmur, gallup, rub or click Lungs:clear without rales, rhonchi, or wheezes YNW:GNFA, non tender, + BS, do not palpate liver spleen or masses Ext:no lower ext edema, 2+ pedal pulses, 2+ radial pulses Neuro:alert and oriented, MAE, follows commands, + facial symmetry    EKG:  EKG is ordered today. The ekg ordered today demonstrates SR no  changes from previous.     Recent Labs: 05/02/2015: Magnesium 2.2; TSH 1.336 05/18/2015: ALT 15 10/13/2015: BUN 17; Creatinine, Ser 0.88; Hemoglobin 13.8; Platelets 256; Potassium 3.9; Sodium 141    Lipid Panel No results found for: CHOL, TRIG, HDL, CHOLHDL, VLDL, LDLCALC, LDLDIRECT     Other studies Reviewed: Additional studies/ records that were reviewed today include:. ECHO 04/2015 Study Conclusions  - Left ventricle: The cavity size was normal. Wall thickness was  increased in a pattern of mild LVH. Systolic function was normal.  The estimated ejection fraction was in the range of 50% to 55%.  Wall motion was normal; there were no regional wall motion  abnormalities. Features are consistent with a pseudonormal left  ventricular filling pattern, with concomitant abnormal relaxation  and increased filling pressure (grade 2 diastolic dysfunction). - Mitral valve: There was mild regurgitation. - Left atrium: The atrium was mildly dilated.   ASSESSMENT AND PLAN:  1.   Hypotension. BP today 80/52 in SR discussed with Dr. Johney FrameAllred DOD.  Will not admit, but encourage increased fluid intake with water but also Gatorade and bullion once a day.  liberalize salt.  Wear knee high stockings.  Follow up in 1 week for BP check and she should avoid spin class until BP improved.  If no improvement next week then would add Florinef.    She is to see neurologist today.  I am not sure if the Parkinson's is part of the  hypotension.  He may feel she needs florinef now.  Will check BMP and CBC and thyroid today.    Her assistant was with her today.   2.  PAF  Off amiodarone due to side effects Continue apixaban for now with a chadsvasc score of 1 Sper Sebastian Acheonna Carrol, NP "he wanted to be considered for tikosyn, but her antidepressant use may be a problem with prolongation of QT potential and QTc at baseline is 440- 452 ms tolerating BB and wants to see how long this may keep her in rhtyhm We discussed how to use an extra 1/2 tab of metoprolol for breakthrough afib,if her blood pressure is sufficient to tolerate higher dose. She typically will run a blood pressure of 80 when in afib"     Current medicines are reviewed with the patient today.  The patient Has no concerns regarding medicines.  The following changes have been made:  See above Labs/ tests ordered today include:see above  Disposition:   FU:  see above  Signed, Nada BoozerLaura Ingold, NP  11/15/2015 10:34 AM    [facility] [address], [place], [place]  [address]/ [address] [place], [place] Phone: [phone]; Fax: [phone]  [phone]

## 2015-11-14 NOTE — Telephone Encounter (Signed)
Pt calling to ask if taking a hot bath could lower her BP-pls call

## 2015-11-14 NOTE — Telephone Encounter (Signed)
Spoke with pt, she reports her bp drops when she takes a hot bath. Currently her bp is 90/62 and she is lightheaded. Explained to pt that the hot water is going to dilate all the blood vessels in the body and that is why her bp drops. Patient voiced understanding to increase fluid intake to help with the bp at present. She was encouraged to avoid hot baths and a shower would probably be a better option. Pt agreed with this plan.

## 2015-11-15 ENCOUNTER — Encounter: Payer: Self-pay | Admitting: Cardiology

## 2015-11-15 ENCOUNTER — Ambulatory Visit (INDEPENDENT_AMBULATORY_CARE_PROVIDER_SITE_OTHER): Payer: Medicare Other | Admitting: Cardiology

## 2015-11-15 VITALS — BP 80/52 | HR 74 | Ht 66.0 in | Wt 147.6 lb

## 2015-11-15 DIAGNOSIS — I480 Paroxysmal atrial fibrillation: Secondary | ICD-10-CM | POA: Diagnosis not present

## 2015-11-15 DIAGNOSIS — G20 Parkinson's disease: Secondary | ICD-10-CM | POA: Diagnosis not present

## 2015-11-15 DIAGNOSIS — I959 Hypotension, unspecified: Secondary | ICD-10-CM

## 2015-11-15 LAB — CBC WITH DIFFERENTIAL/PLATELET
BASOS ABS: 0 {cells}/uL (ref 0–200)
BASOS PCT: 0 %
EOS ABS: 396 {cells}/uL (ref 15–500)
EOS PCT: 4 %
HCT: 41 % (ref 35.0–45.0)
HEMOGLOBIN: 13.9 g/dL (ref 11.7–15.5)
LYMPHS ABS: 2970 {cells}/uL (ref 850–3900)
Lymphocytes Relative: 30 %
MCH: 31.2 pg (ref 27.0–33.0)
MCHC: 33.9 g/dL (ref 32.0–36.0)
MCV: 91.9 fL (ref 80.0–100.0)
MONOS PCT: 8 %
MPV: 9 fL (ref 7.5–12.5)
Monocytes Absolute: 792 cells/uL (ref 200–950)
NEUTROS ABS: 5742 {cells}/uL (ref 1500–7800)
Neutrophils Relative %: 58 %
PLATELETS: 263 10*3/uL (ref 140–400)
RBC: 4.46 MIL/uL (ref 3.80–5.10)
RDW: 13.3 % (ref 11.0–15.0)
WBC: 9.9 10*3/uL (ref 3.8–10.8)

## 2015-11-15 LAB — BASIC METABOLIC PANEL
BUN: 13 mg/dL (ref 7–25)
CHLORIDE: 101 mmol/L (ref 98–110)
CO2: 29 mmol/L (ref 20–31)
CREATININE: 0.87 mg/dL (ref 0.50–0.99)
Calcium: 9.7 mg/dL (ref 8.6–10.4)
Glucose, Bld: 79 mg/dL (ref 65–99)
Potassium: 4.4 mmol/L (ref 3.5–5.3)
SODIUM: 138 mmol/L (ref 135–146)

## 2015-11-15 LAB — TSH: TSH: 2.59 m[IU]/L

## 2015-11-15 NOTE — Patient Instructions (Addendum)
Medication Instructions:  Your physician recommends that you continue on your current medications as directed. Please refer to the Current Medication list given to you today.   Labwork: TODAY BMET, CBC W/DIFF, TSH  Testing/Procedures: NONE  Follow-Up: 1. YOU HAVE A FOLLOW UP WITH KATIE THOMPSON, PAC @ 8 AM ON 11/23/15  Any Other Special Instructions Will Be Listed Below (If Applicable).  PER LAURA INGOLD, PAC TO TRY WEARING COMPRESSION STOCKINGS DAILY; TRY TO WEAR FROM THE TIME YOU WAKE UP UNTIL BED TIME IF POSSIBLE  YOU CAN ADD EXTRA SALT TO YOUR DIET; GATORADE, SOUPS, ETC.  If you need a refill on your cardiac medications before your next appointment, please call your pharmacy.

## 2015-11-16 ENCOUNTER — Telehealth: Payer: Self-pay | Admitting: Cardiovascular Disease

## 2015-11-16 NOTE — Telephone Encounter (Signed)
New message     The pt is calling back to get lab work results.

## 2015-11-16 NOTE — Telephone Encounter (Signed)
Spoke with pt and reviewed lab results with her. She reports she saw Dr. Zola ButtonHaq and [facility] Modesto [facility].  Both are neurologists. Medicines were adjusted. She reports frequency of carbidopa-levodopa was increased, Klonopin dose was increased and Zoloft dose was increased.   Pt aware of follow up appt with Nada BoozerLaura Ingold, NP on 6/8

## 2015-11-16 NOTE — Telephone Encounter (Signed)
Great.  Thanks

## 2015-11-17 ENCOUNTER — Ambulatory Visit: Payer: Medicare Other

## 2015-11-20 ENCOUNTER — Other Ambulatory Visit (HOSPITAL_COMMUNITY): Payer: Self-pay | Admitting: Nurse Practitioner

## 2015-11-22 NOTE — Progress Notes (Signed)
Cardiology Office Note   Date:  11/23/2015   ID:  Barron Schmidva H Hanko, DOB 05/29/1951, MRN [id number]  PCP:  Redmond BasemanWONG,FRANCIS PATRICK, MD  Dr. Zola ButtonHaq and [facility] Modesto [facility]  Cardiologist:  Dr. Theo DillsMcAlhany/Donna Carroll, NP     Chief Complaint  Patient presents with  . Follow-up      History of Present Illness: Sharon Jenkins is a 65 y.o. female who presents for follow up of hypotension. Last week she was been hypotensive and no a fib since the 18th. Worse after exercise, increases with standing. It has been as low as 67/49. When this low she sits or rests in recliner and drinks water, She rarely drinks caffeine. Does not eat much salty foods. She has support stockings but not wearing them. prior to OV last week, with hot bath felt she may pass out - has now been instructed to not take hot showers. With the low BP her mentation was not been good.  We increased her salt intake, increase water and gatorade.  She also saw her neurologist and he adjusted meds.  Our thought had been to add florinef if BP not improved.    She has a history of depression and Parkinsons. Was seen here in Nov 2016 with AF. She apparently has a history of PAF in 2014 that converted spontaneously. In Nov she was started on Amiodarone and Eliquis (CHADs VASc=1). When she was seen in the AF clinic two weeks later she was in NSR. Since being on Amiodarone she has complained of a list of side effects- blurred vision, weakness, increased tremors, fatigue, and wgt gain. She actually asked about being [facility] to [facility], apparently her sister takes this. She called the service 06/20/15 and was instructed to stop Amiodarone and f/u in the clinic. She has said she feels better off Amiodarone.   She has seen neurology as well with increase of her Zoloft and her sinamet.   She is better today, BP improved.  Lying BP 113/72, sitting 104/66 standing 94/64 and standing at 3 min 104/68  Pulse stable.  She is back to spin class  without problems.  The support stockings seem to have done most good.   Past Medical History  Diagnosis Date  . Spinal stenosis of lumbar region   . Parkinson disease (HCC) dx'd ~ 2009  . DVT (deep venous thrombosis) (HCC)     "don't remember which side"  . Asthma   . Walking pneumonia X 1  . Chronic bronchitis (HCC)     "I'm good for a case a year" (05/02/2015)  . Sleep apnea     "I chose not to wear a mask" (05/02/2015)  . Anxiety   . Depression   . Atrial fibrillation [facility](HCC)     Past Surgical History  Procedure Laterality Date  . Lumbar disc arthroplasty  1991  . Knee arthroscopy Left     "meniscus tear"  . Back surgery    . Abdominal hysterectomy  ~ 2006  . Dilation and curettage of uterus  1974  . Tubal ligation  05/1978  . Cataract extraction w/ intraocular lens  implant, bilateral Bilateral 1998  . Genioplasty  ~ [id number]    "placed screw and bone matter in my chin & brought it forward"  . Varicose vein surgery Right 1987     Current Outpatient Prescriptions  Medication Sig Dispense Refill  . Calcium Carbonate-Vit D-Min (CALCIUM 600+D3 PLUS MINERALS PO) Take 1 tablet by mouth daily.    .Marland Kitchen  carbidopa-levodopa (SINEMET IR) 25-100 MG per tablet Take 1.5 tablets by mouth every 3 (three) hours. Pt takes 1.5 mg tablet every 2-3 hrs    . clonazePAM (KLONOPIN) 0.5 MG tablet Take 0.25-0.5 mg by mouth 2 (two) times daily. Take 1/2 tablet by moyth at noon and take 1 tablet by mouth at bedtime    . ELIQUIS 5 MG TABS tablet TAKE 1 TABLET BY MOUTH 2 TIMES DAILY 60 tablet 6  . Multiple Vitamin (MULTIVITAMINS PO) Take 1 tablet by mouth daily.    . Polyvinyl Alcohol-Povidone (REFRESH OP) Place 1 drop into both eyes daily as needed (dry eyes).     Marland Kitchen rOPINIRole (REQUIP XL) 8 MG 24 hr tablet Take 8 mg by mouth daily.     . sertraline (ZOLOFT) 50 MG tablet Take 100 mg by mouth daily. Pt takes 1.5 tablets daily     No current facility-administered medications for this visit.     Allergies:   Lidocaine    Social History:  The patient  reports that she has never smoked. She has never used smokeless tobacco. She reports that she does not drink alcohol or use illicit drugs.   Family History:  The patient's family history includes Heart attack in her mother; Hypertension in her father; Stroke in her father and sister.    ROS:  General:no colds or fevers, no weight changes Skin:no rashes or ulcers HEENT:no blurred vision, no congestion CV:see HPI PUL:see HPI GI:no diarrhea constipation or melena, no indigestion GU:no hematuria, no dysuria MS:no joint pain, no claudication Neuro:no syncope, no lightheadedness Endo:no diabetes, no thyroid disease  Wt Readings from Last 3 Encounters:  11/23/15 135 lb 1.9 oz (61.29 kg)  11/15/15 147 lb 9.6 oz (66.951 kg)  10/18/15 146 lb (66.225 kg)     PHYSICAL EXAM: VS:  BP 116/80 mmHg  Pulse 74  Ht 5\' 6"  (1.676 m)  Wt 135 lb 1.9 oz (61.29 kg)  BMI 21.82 kg/m2  SpO2 97% , BMI Body mass index is 21.82 kg/(m^2). General:Pleasant affect, NAD Skin:Warm and dry, brisk capillary refill HEENT:normocephalic, sclera clear, mucus membranes moist Neck:supple, no JVD, no bruits  Heart:S1S2 RRR without murmur, gallup, rub or click Lungs:clear without rales, rhonchi, or wheezes JXB:JYNW, non tender, + BS, do not palpate liver spleen or masses Ext:no lower ext edema, 2+ pedal pulses, 2+ radial pulses Neuro:alert and oriented, MAE, follows commands, + facial symmetry    EKG:  EKG is NOT ordered today.    Recent Labs: 05/02/2015: Magnesium 2.2 05/18/2015: ALT 15 11/15/2015: BUN 13; Creat 0.87; Hemoglobin 13.9; Platelets 263; Potassium 4.4; Sodium 138; TSH 2.59    Lipid Panel No results found for: CHOL, TRIG, HDL, CHOLHDL, VLDL, LDLCALC, LDLDIRECT     Other studies Reviewed: Additional studies/ records that were reviewed today include:    ASSESSMENT AND PLAN:   1. Hypotension. Last visit BP 80/52 in SR.   She  increased fluid intake with water but also Gatorade and bullion once a day.  Wearing  knee high stockings. Her Neurologist adjusted her meds as well with increase of Zoloft and her Parkinson's meds.  Her labs were stable. Her BP is improved.  Her assistant was with her today.   2. PAF Off amiodarone due to side effects Continue apixaban for now with a chadsvasc score of 1 Sper Sharon Ache, NP "she wanted to be considered for tikosyn, but her antidepressant use may be a problem with prolongation of QT potential and QTc at baseline is 440-  452 ms tolerating BB and wants to see how long this may keep her in rhtyhm We discussed how to use an extra 1/2 tab of metoprolol for breakthrough afib,if her blood pressure is sufficient to tolerate higher dose. She typically will run a blood pressure of 80 when in afib"  Current medicines are reviewed with the patient today.  The patient Has no concerns regarding medicines.  The following changes have been made:  See above Labs/ tests ordered today include:see above  Disposition:   FU:  see above- she has appt with [name], NP in near future.   Signed, Sharon Boozer, NP  11/23/2015 5:58 PM    [facility] [address] [place], [place], [place]  [address]/ [address] [place], [place] Phone: [phone]; Fax: [phone]  [phone]

## 2015-11-23 ENCOUNTER — Ambulatory Visit (INDEPENDENT_AMBULATORY_CARE_PROVIDER_SITE_OTHER): Payer: Medicare Other | Admitting: Cardiology

## 2015-11-23 ENCOUNTER — Encounter: Payer: Self-pay | Admitting: Cardiology

## 2015-11-23 VITALS — BP 116/80 | HR 74 | Ht 66.0 in | Wt 135.1 lb

## 2015-11-23 DIAGNOSIS — I480 Paroxysmal atrial fibrillation: Secondary | ICD-10-CM

## 2015-11-23 DIAGNOSIS — G20 Parkinson's disease: Secondary | ICD-10-CM | POA: Diagnosis not present

## 2015-11-23 DIAGNOSIS — I959 Hypotension, unspecified: Secondary | ICD-10-CM

## 2015-11-23 DIAGNOSIS — Z7901 Long term (current) use of anticoagulants: Secondary | ICD-10-CM

## 2015-11-23 NOTE — Patient Instructions (Signed)
Medication Instructions:  Your physician recommends that you continue on your current medications as directed. Please refer to the Current Medication list given to you today.   Labwork: none  Testing/Procedures: none  Follow-Up: Keep scheduled follow-up with Rudi Cocoonna Carroll, NP in A. Fib clinic  Any Other Special Instructions Will Be Listed Below (If Applicable).     If you need a refill on your cardiac medications before your next appointment, please call your pharmacy.

## 2015-12-20 ENCOUNTER — Telehealth: Payer: Self-pay | Admitting: Cardiology

## 2015-12-20 ENCOUNTER — Ambulatory Visit (HOSPITAL_COMMUNITY): Payer: Medicare Other | Admitting: Nurse Practitioner

## 2015-12-20 NOTE — Telephone Encounter (Signed)
Walk in pt form-[employer]-Form dropped off gave to Carol/Laura to see if she will complete.

## 2015-12-21 ENCOUNTER — Telehealth (HOSPITAL_COMMUNITY): Payer: Self-pay | Admitting: *Deleted

## 2015-12-21 ENCOUNTER — Telehealth: Payer: Self-pay | Admitting: Cardiology

## 2015-12-21 NOTE — Telephone Encounter (Signed)
Patient called in stating she had gone back into afib HR 120s BP 109-77. No chest pain or shortness of breath but slightly dizzy.  Patient is off daily metoprolol. Discussed with Rudi Cocoonna Carroll NP recommended pt take 1/2 tablet of metoprolol 25mg  now. Can repeat every 6 hours as needed as long as BP/HR greater than 100. Pt verbalized understanding.

## 2015-12-21 NOTE — Telephone Encounter (Signed)
Spoke with patient-she is aware [employer] paper signed and completed I have faxed this to [employer] for her And mailed to her home address.

## 2015-12-23 ENCOUNTER — Telehealth: Payer: Self-pay | Admitting: Physician Assistant

## 2015-12-23 NOTE — Telephone Encounter (Signed)
Patient called answering service on Saturday. She is concerned because she has had persistent chest pain and trouble catching her breath along with feet tingling and headache. She feels like there is an elephant sitting on her chest. She has had these sx in the past in afib but she says she is in NSR but continues to feel this discomfort. She denies h/o w/u for CAD. I advised she proceed to ER for evaluation given ongoing discomfort and SOB. She continued ruminate on her medical history and various other issues and I had to redirect her each time about my advice to proceed to the Er. She finally agreed and said she would get her daughter to bring her. Dayna Dunn PA-C

## 2015-12-25 NOTE — Telephone Encounter (Signed)
Per Dr. Sanjuana KavaMcAlhaney request call pt to see how she was doing.  LM for her to call.

## 2015-12-25 NOTE — Telephone Encounter (Signed)
Can we check on Sharon Jenkins to see how she is doing.  I met Sharon Jenkins in the ED last year but she has been followed in the atrial fib clinic. If she is having issues, she may need to be seen in their clinic today. THanks, chris 

## 2015-12-25 NOTE — Telephone Encounter (Signed)
Returning call. States she took Metoprolol on Saturday as instructed by Dayna Dunn,PA.  States within 30 min of taking the Metoprolol, drinking water and gator aid she felt much better and was back into rhythm.  States Sunday she was in and out of Afib but did not take any Metoprolol.  Today she just feels weak and HR was 83 today.  Has not had any episodes of Afib but still feels the chest pressure.  Is being seen in the Afib clinic tomorrow at 11:30.  Advised if she felt like she has another episode of Afib she should go to ER. She states she isn't going to ER.  She feels comfortable with being seen in Afib clinic tomorrow.

## 2015-12-26 ENCOUNTER — Encounter (HOSPITAL_COMMUNITY): Payer: Self-pay | Admitting: Nurse Practitioner

## 2015-12-26 ENCOUNTER — Ambulatory Visit (HOSPITAL_COMMUNITY)
Admission: RE | Admit: 2015-12-26 | Discharge: 2015-12-26 | Disposition: A | Discharge status: Home / Self Care | Payer: Medicare Other | Source: Ambulatory Visit | Attending: Nurse Practitioner | Admitting: Nurse Practitioner

## 2015-12-26 VITALS — BP 90/58 | HR 71 | Ht 66.0 in | Wt 147.8 lb

## 2015-12-26 DIAGNOSIS — R61 Generalized hyperhidrosis: Secondary | ICD-10-CM | POA: Diagnosis not present

## 2015-12-26 DIAGNOSIS — I480 Paroxysmal atrial fibrillation: Secondary | ICD-10-CM

## 2015-12-26 DIAGNOSIS — R9431 Abnormal electrocardiogram [ECG] [EKG]: Secondary | ICD-10-CM | POA: Insufficient documentation

## 2015-12-26 DIAGNOSIS — R0789 Other chest pain: Secondary | ICD-10-CM | POA: Diagnosis not present

## 2015-12-26 DIAGNOSIS — I4891 Unspecified atrial fibrillation: Secondary | ICD-10-CM | POA: Diagnosis present

## 2015-12-26 NOTE — Patient Instructions (Addendum)
Your physician has requested that you have a lexiscan myoview. For further information please visit [email or web address]. Please follow instruction sheet, as given.   

## 2015-12-26 NOTE — Progress Notes (Signed)
Patient ID: FLORNCE RECORD, female   DOB: [birth year], 65 y.o.   MRN: [id number]     Primary Care Physician: Redmond Baseman, MD Referring Physician: [facility] F/U   Sharon Jenkins is a 65 y.o. female with a h/o Parkinson's,PAF, dx in 2014, who presented to [facility] ED on 05/02/2015 from her PCP for atrial fibrillation with RVR.  She had began feeling weak and fatigued starting earlier that day and went to her PCP for evaluation where an EKG confirmed she was in atrial fibrillation. She was unaware of her tachycardiac rate and denied any palpitations, chest pain, or shortness of breath. She reported having been in this rhythm in 2014 but spontaneously converted back to NSR without requiring medications or DCCV.  In the ED, she was started on a Cardizem drip but was unable to tolerate the medication secondary to hypotension. She was therefore started on IV Amiodarone instead. She was started on Eliquis for anticoagulation.  Overnight, she converted to NSR. Her HR was in the 60's - 70's at time of examination on 05/03/2015. She reported feeling well and said her weakness and fatigue from the previous day had resolved. Her IV Amiodarone was switched to 400mg  PO BID. She ambulated over 300 feet with physical therapy using her walker and was able to climb 10 stairs. Physical Therapy recommended home health PT and this was been arranged by the case manager.  She  followed-up in the Atrial Fibrillation clinic on 05/09/2015,  and reported  that she was not tolerating the amiodarone load very well. She has been having a lot of shaking of her hands and feet and not sleeping well. She tried taking just 200 mg amiodarone one day and felt like her usual self. She is in SR today and has not felt any return of afib. She is tolerating blood thinner without issues.   She denies drinking any caffeine, alcohol. She is not overweight and she does water aerobics and yoga several times a week. She did have a sleep study in  the [place] area 1-2 years, which was positive for sleep apnea but never started due to fear of the mask. Echo showed normal ef with a grade 2 diastolic dysfunction and mildly dilated left atrium.  On f/u visit, she reported that she was doing better on the amiodarone dose of 200 mg a day. She was having a lot of shaking and flapping of her arms/ feet and called to the office several days after being seen in clinic and wondered if she needed to go to the ER, thought symptoms  were from amiodarone. I asked her to call her neurologist and they did not feel any adjustment of her parkinson drugs needed to be made. She recently saw her psychiatrist and she thought that her symptoms were from panic attacks, increased her zoloft and clonazepam. She then went to ER for weakness and it was thought to be from increase of zoloft. At that visit, she felt better, her outlook was better and she seemed more relaxed. She has not noticed any further afib. No further uncontrolled shaking of hands and feet. She would however, like to switch to another antiarrythmic in the future. Her sister is on [place] and she would like to be considered for this drug after the first of the year. She is aware that she will have to stop drug and allow for washout which make take 4-8 weeks. And she is on an antidepressant which may make tiksoyn not a viable  option due to possible prolongation of QTc. She is participating in a spin class on a regular basis and has not had any issues retuning to class on amiodarone. Has not noticed any further afib.   She then had worsening symptoms of tremors, weakness, blurry vision and amiodarone was stopped 06/20/15.  In the afib clinic today to discuss options. Pt is feeling better and the plan made earlier with [facility] was to get an amiodarone level today and discuss tikosyn. However, has not had  any afib issues for months and at this point may be best to take a wait and see approach, especially since  qtc at baseline is around 440 ms and she is on an antidepressant which the pt may not tolerate stopping.   Returns to afib clinic today 5/3, after having a ER visit for afib associated with hypotension, RVR, chest pain, presyncope. She states these are her usual symptoms when she goes into afib, with BP making it hard to give extra rate control in an outpt setting. She returned to SR on her own in the ER. She was started on metoprolol and seems to be tolerating and no further afib.   She then called  to office one day and because of such low BP's readings at home, around 80 systolic, it was decided to stop daily metoprolol and just use metoprolol, 1/2 tab, as needed for break though afib. She has had several episodes of afib recently and uses prn metoprolol, but then has a subsequent drop in BP which will respond with drinking water andGatorade. She does tell me that she wakes up sometimes with chest pressure and heart pounding. She does not untreated sleep apnea. Over the weekend, she has developed new symptoms of  chest pressure, off and on, Sat/Sun associated with diaphoresis , not associated with atrial fib. She called the on call MD over the week end and he said that she needed a stress test. Do to her Parkinson's disease and instability she will have to have  a lexi myoview. She currently does not want to pursue other AAD's for afib and does not want to be referred to Dr. Johney Frame at this point for ablation. Her main concerns at this point, is new onset chest pain, diaphoresis, and worseing hypotension. She is wearing knee high support socks and does not feel as lightheaded since wearing them.   Today, she denies symptoms of palpitations, chest pain, shortness of breath, orthopnea, PND, lower extremity edema, dizziness, presyncope, syncope, or neurologic sequela. The patient is tolerating medications without difficulties and is otherwise without complaint today.   Past Medical History  Diagnosis Date    . Spinal stenosis of lumbar region   . Parkinson disease (HCC) dx'd ~ 2009  . DVT (deep venous thrombosis) (HCC)     "don't remember which side"  . Asthma   . Walking pneumonia X 1  . Chronic bronchitis (HCC)     "I'm good for a case a year" (05/02/2015)  . Sleep apnea     "I chose not to wear a mask" (05/02/2015)  . Anxiety   . Depression   . Atrial fibrillation [facility])    Past Surgical History  Procedure Laterality Date  . Lumbar disc arthroplasty  1991  . Knee arthroscopy Left     "meniscus tear"  . Back surgery    . Abdominal hysterectomy  ~ 2006  . Dilation and curettage of uterus  1974  . Tubal ligation  05/1978  .  Cataract extraction w/ intraocular lens  implant, bilateral Bilateral 1998  . Genioplasty  ~ [id number]    "placed screw and bone matter in my chin & brought it forward"  . Varicose vein surgery Right 1987    Current Outpatient Prescriptions  Medication Sig Dispense Refill  . Calcium Carbonate-Vit D-Min (CALCIUM 600+D3 PLUS MINERALS PO) Take 1 tablet by mouth daily.    . carbidopa-levodopa (SINEMET IR) 25-100 MG per tablet Take 1.5 tablets by mouth every 3 (three) hours. Pt takes 1.5 mg tablet every 2-3 hrs    . clonazePAM (KLONOPIN) 0.5 MG tablet Take 0.25-0.5 mg by mouth 2 (two) times daily. Take 1/2 tablet by moyth at noon and take 1 tablet by mouth at bedtime    . ELIQUIS 5 MG TABS tablet TAKE 1 TABLET BY MOUTH 2 TIMES DAILY 60 tablet 6  . Multiple Vitamin (MULTIVITAMINS PO) Take 1 tablet by mouth daily.    . Polyvinyl Alcohol-Povidone (REFRESH OP) Place 1 drop into both eyes daily as needed (dry eyes).     Marland Kitchen. rOPINIRole (REQUIP XL) 8 MG 24 hr tablet Take 8 mg by mouth daily.     . sertraline (ZOLOFT) 50 MG tablet Take 75 mg by mouth daily. Pt takes 1.5 tablets daily     No current facility-administered medications for this encounter.    Allergies  Allergen Reactions  . Lidocaine Hives    Social History   Social History  . Marital Status: Widowed     Spouse Name: N/A  . Number of Children: N/A  . Years of Education: N/A   Occupational History  . Not on file.   Social History Main Topics  . Smoking status: Never Smoker   . Smokeless tobacco: Never Used  . Alcohol Use: No  . Drug Use: No  . Sexual Activity: Not Currently   Other Topics Concern  . Not on file   Social History Narrative    Family History  Problem Relation Age of Onset  . Heart attack Mother   . Hypertension Father   . Stroke Sister   . Stroke Father     ROS- All systems are reviewed and negative except as per the HPI above  Physical Exam: Filed Vitals:   12/26/15 1136  BP: 90/58  Pulse: 71  Height: 5\' 6"  (1.676 m)  Weight: 147 lb 12.8 oz (67.042 kg)    GEN- The patient is well appearing, alert and oriented x 3 today, with flat facial affect.   Head- normocephalic, atraumatic Eyes-  Sclera clear, conjunctiva pink Ears- hearing intact Oropharynx- clear Neck- supple, no JVP Lymph- no cervical lymphadenopathy Lungs- Clear to ausculation bilaterally, normal work of breathing Heart- regular rate and rhythm, no murmurs, rubs or gallops, PMI not laterally displaced GI- soft, NT, ND, + BS Extremities- no clubbing, cyanosis, or edema MS- no significant deformity or atrophy Skin- no rash or lesion Psych- euthymic mood, full affect Neuro- strength and sensation are intact  EKG- NSR at 71 bpm, pr int 130 ms, qrs int 84 ms, qtc 436 ms, septal infarct, age undetermined Epic records reviewed.   Echo-Left ventricle: The cavity size was normal. Wall thickness was increased in a pattern of mild LVH. Systolic function was normal. The estimated ejection fraction was in the range of 50% to 55%. Wall motion was normal; there were no regional wall motion abnormalities. Features are consistent with a pseudonormal left ventricular filling pattern, with concomitant abnormal relaxation and increased filling pressure (grade 2 diastolic  dysfunction). - Mitral valve: There was mild regurgitation. - Left atrium: The atrium was mildly dilated.  Assessment and Plan: 1. PAF Off amiodarone due to side effects Continue apixaban for now with  a chadsvasc score of 1 She wanted to be considered for tikosyn at one time, but her antidepressant use may be a problem with prolongation of QT and QTc at baseline is 440- 452 ms. She currently is ok with using prn metoprolol for now, and treating subsequent hypotension with water, gatorade Ablation is also an option. Her son works in the [employer] area as a device rep and if that is needed, her son would like her to have with EP that he works with in that area, however the pt states that she rather have this done local and stay in her own home, but for now on back burner. We discussed how to use an extra 1/2 tab of metoprolol for breakthrough afib,if her blood pressure is sufficient to tolerate higher dose. She typically will run a blood pressure of 80 when in afib.  2. New onset chest pressure/diaphoresis  Lexi myoview Pt uses a walker due to  instability for Parkinson's and will not be able to walk on treadmill  3. Hypotension Using Ted hose which has helped, extra salt and Gatorade at times but may need a volume expander Very difficult to treat PAF when prn metoprolol drops BP  F/u with Dr. Clifton James or when APP is in office with him and he can speak to pt as well.    [facility] [address] [place], [place] [address] [phone]

## 2016-01-02 ENCOUNTER — Encounter (HOSPITAL_COMMUNITY): Payer: Medicare Other

## 2016-01-04 ENCOUNTER — Telehealth (HOSPITAL_COMMUNITY): Payer: Self-pay | Admitting: *Deleted

## 2016-01-04 NOTE — Telephone Encounter (Signed)
Left message on voicemail in reference to upcoming appointment scheduled for 01/09/16. Phone number given for a call back so details instructions can be given.  Ricky AlaSmith, Wanda Rideout Jacqueline

## 2016-01-09 ENCOUNTER — Encounter (HOSPITAL_COMMUNITY): Payer: Medicare Other

## 2016-01-12 ENCOUNTER — Ambulatory Visit: Payer: Medicare Other | Admitting: Physician Assistant

## 2016-01-14 ENCOUNTER — Emergency Department (HOSPITAL_COMMUNITY): Payer: Medicare Other

## 2016-01-14 ENCOUNTER — Observation Stay (HOSPITAL_COMMUNITY): Payer: Medicare Other

## 2016-01-14 ENCOUNTER — Observation Stay (HOSPITAL_COMMUNITY)
Admission: EM | Admit: 2016-01-14 | Discharge: 2016-01-14 | Disposition: A | Discharge status: Home / Self Care | Payer: Medicare Other | Attending: Internal Medicine | Admitting: Internal Medicine

## 2016-01-14 ENCOUNTER — Encounter (HOSPITAL_COMMUNITY): Payer: Self-pay

## 2016-01-14 ENCOUNTER — Observation Stay (HOSPITAL_BASED_OUTPATIENT_CLINIC_OR_DEPARTMENT_OTHER): Payer: Medicare Other

## 2016-01-14 DIAGNOSIS — I6789 Other cerebrovascular disease: Secondary | ICD-10-CM

## 2016-01-14 DIAGNOSIS — R262 Difficulty in walking, not elsewhere classified: Secondary | ICD-10-CM | POA: Insufficient documentation

## 2016-01-14 DIAGNOSIS — Z86718 Personal history of other venous thrombosis and embolism: Secondary | ICD-10-CM | POA: Diagnosis not present

## 2016-01-14 DIAGNOSIS — E785 Hyperlipidemia, unspecified: Secondary | ICD-10-CM | POA: Diagnosis not present

## 2016-01-14 DIAGNOSIS — I480 Paroxysmal atrial fibrillation: Secondary | ICD-10-CM | POA: Diagnosis present

## 2016-01-14 DIAGNOSIS — J449 Chronic obstructive pulmonary disease, unspecified: Secondary | ICD-10-CM | POA: Diagnosis not present

## 2016-01-14 DIAGNOSIS — G20 Parkinson's disease: Secondary | ICD-10-CM | POA: Diagnosis not present

## 2016-01-14 DIAGNOSIS — I639 Cerebral infarction, unspecified: Secondary | ICD-10-CM

## 2016-01-14 DIAGNOSIS — F419 Anxiety disorder, unspecified: Secondary | ICD-10-CM | POA: Insufficient documentation

## 2016-01-14 DIAGNOSIS — I10 Essential (primary) hypertension: Secondary | ICD-10-CM | POA: Insufficient documentation

## 2016-01-14 DIAGNOSIS — E669 Obesity, unspecified: Secondary | ICD-10-CM | POA: Diagnosis not present

## 2016-01-14 DIAGNOSIS — R200 Anesthesia of skin: Principal | ICD-10-CM | POA: Diagnosis present

## 2016-01-14 DIAGNOSIS — Z8249 Family history of ischemic heart disease and other diseases of the circulatory system: Secondary | ICD-10-CM | POA: Insufficient documentation

## 2016-01-14 DIAGNOSIS — F329 Major depressive disorder, single episode, unspecified: Secondary | ICD-10-CM | POA: Insufficient documentation

## 2016-01-14 DIAGNOSIS — I6330 Cerebral infarction due to thrombosis of unspecified cerebral artery: Secondary | ICD-10-CM

## 2016-01-14 DIAGNOSIS — G4733 Obstructive sleep apnea (adult) (pediatric): Secondary | ICD-10-CM | POA: Insufficient documentation

## 2016-01-14 DIAGNOSIS — R531 Weakness: Secondary | ICD-10-CM | POA: Diagnosis present

## 2016-01-14 DIAGNOSIS — Z7901 Long term (current) use of anticoagulants: Secondary | ICD-10-CM | POA: Diagnosis not present

## 2016-01-14 DIAGNOSIS — M6289 Other specified disorders of muscle: Secondary | ICD-10-CM

## 2016-01-14 DIAGNOSIS — G20A1 Parkinson's disease without dyskinesia, without mention of fluctuations: Secondary | ICD-10-CM | POA: Diagnosis present

## 2016-01-14 LAB — URINE MICROSCOPIC-ADD ON: RBC / HPF: NONE SEEN RBC/hpf (ref 0–5)

## 2016-01-14 LAB — LIPID PANEL
CHOL/HDL RATIO: 3.4 ratio
Cholesterol: 158 mg/dL (ref 0–200)
HDL: 47 mg/dL (ref >40)
LDL CALC: 94 mg/dL (ref 0–99)
Triglycerides: 86 mg/dL (ref <150)
VLDL: 17 mg/dL (ref 0–40)

## 2016-01-14 LAB — I-STAT TROPONIN, ED: Troponin i, poc: 0.01 ng/mL (ref 0.00–0.08)

## 2016-01-14 LAB — DIFFERENTIAL
BASOS PCT: 0 %
Basophils Absolute: 0 10*3/uL (ref 0.0–0.1)
EOS PCT: 5 %
Eosinophils Absolute: 0.4 10*3/uL (ref 0.0–0.7)
Lymphocytes Relative: 40 %
Lymphs Abs: 3 10*3/uL (ref 0.7–4.0)
MONO ABS: 0.7 10*3/uL (ref 0.1–1.0)
Monocytes Relative: 10 %
Neutro Abs: 3.4 10*3/uL (ref 1.7–7.7)
Neutrophils Relative %: 45 %

## 2016-01-14 LAB — RAPID URINE DRUG SCREEN, HOSP PERFORMED
AMPHETAMINES: NOT DETECTED
Barbiturates: NOT DETECTED
Benzodiazepines: NOT DETECTED
Cocaine: NOT DETECTED
OPIATES: NOT DETECTED
Tetrahydrocannabinol: NOT DETECTED

## 2016-01-14 LAB — URINALYSIS, ROUTINE W REFLEX MICROSCOPIC
BILIRUBIN URINE: NEGATIVE
Glucose, UA: NEGATIVE mg/dL
Hgb urine dipstick: NEGATIVE
Ketones, ur: NEGATIVE mg/dL
NITRITE: NEGATIVE
PH: 7 (ref 5.0–8.0)
Protein, ur: NEGATIVE mg/dL
SPECIFIC GRAVITY, URINE: 1.012 (ref 1.005–1.030)

## 2016-01-14 LAB — COMPREHENSIVE METABOLIC PANEL
ALK PHOS: 67 U/L (ref 38–126)
ALT: 5 U/L — ABNORMAL LOW (ref 14–54)
ANION GAP: 6 (ref 5–15)
AST: 26 U/L (ref 15–41)
Albumin: 4 g/dL (ref 3.5–5.0)
BUN: 10 mg/dL (ref 6–20)
CALCIUM: 9.9 mg/dL (ref 8.9–10.3)
CHLORIDE: 106 mmol/L (ref 101–111)
CO2: 27 mmol/L (ref 22–32)
Creatinine, Ser: 0.75 mg/dL (ref 0.44–1.00)
GFR calc non Af Amer: >60 mL/min (ref >60)
Glucose, Bld: 91 mg/dL (ref 65–99)
Potassium: 3.7 mmol/L (ref 3.5–5.1)
SODIUM: 139 mmol/L (ref 135–145)
Total Bilirubin: 0.4 mg/dL (ref 0.3–1.2)
Total Protein: 6.6 g/dL (ref 6.5–8.1)

## 2016-01-14 LAB — CBC
HCT: 40 % (ref 36.0–46.0)
Hemoglobin: 13.2 g/dL (ref 12.0–15.0)
MCH: 30.6 pg (ref 26.0–34.0)
MCHC: 33 g/dL (ref 30.0–36.0)
MCV: 92.8 fL (ref 78.0–100.0)
PLATELETS: 229 10*3/uL (ref 150–400)
RBC: 4.31 MIL/uL (ref 3.87–5.11)
RDW: 12.8 % (ref 11.5–15.5)
WBC: 7.5 10*3/uL (ref 4.0–10.5)

## 2016-01-14 LAB — PROTIME-INR
INR: 1.12
Prothrombin Time: 14.4 seconds (ref 11.4–15.2)

## 2016-01-14 LAB — APTT: APTT: 33 s (ref 24–36)

## 2016-01-14 LAB — I-STAT CHEM 8, ED
BUN: 12 mg/dL (ref 6–20)
CALCIUM ION: 1.21 mmol/L (ref 1.12–1.23)
Chloride: 102 mmol/L (ref 101–111)
Creatinine, Ser: 0.7 mg/dL (ref 0.44–1.00)
GLUCOSE: 90 mg/dL (ref 65–99)
HCT: 40 % (ref 36.0–46.0)
HEMOGLOBIN: 13.6 g/dL (ref 12.0–15.0)
POTASSIUM: 3.7 mmol/L (ref 3.5–5.1)
SODIUM: 142 mmol/L (ref 135–145)
TCO2: 26 mmol/L (ref 0–100)

## 2016-01-14 LAB — ETHANOL

## 2016-01-14 MED ORDER — STROKE: EARLY STAGES OF RECOVERY BOOK
Freq: Once | Status: DC
  Filled 2016-01-14: qty 1

## 2016-01-14 MED ORDER — ROPINIROLE HCL ER 8 MG PO TB24
8.0000 mg | ORAL_TABLET | Freq: Every day | ORAL | Status: DC
  Filled 2016-01-14: qty 1

## 2016-01-14 MED ORDER — APIXABAN 5 MG PO TABS
5.0000 mg | ORAL_TABLET | Freq: Two times a day (BID) | ORAL | Status: DC
  Administered 2016-01-14: 5 mg via ORAL
  Filled 2016-01-14: qty 1

## 2016-01-14 MED ORDER — CLONAZEPAM 0.5 MG PO TABS
0.2500 mg | ORAL_TABLET | Freq: Every day | ORAL | Status: DC
Start: 2016-01-14 — End: 2016-01-14
  Administered 2016-01-14: 0.25 mg via ORAL
  Filled 2016-01-14: qty 1

## 2016-01-14 MED ORDER — CARBIDOPA-LEVODOPA 25-100 MG PO TABS
1.5000 | ORAL_TABLET | ORAL | Status: DC
  Administered 2016-01-14 (×3): 1.5 via ORAL
  Filled 2016-01-14: qty 1.5
  Filled 2016-01-14: qty 2
  Filled 2016-01-14: qty 1.5

## 2016-01-14 MED ORDER — CLONAZEPAM 0.5 MG PO TABS: 0.5000 mg | ORAL_TABLET | Freq: Every day | ORAL | Status: DC

## 2016-01-14 MED ORDER — ADULT MULTIVITAMIN W/MINERALS CH
1.0000 | ORAL_TABLET | Freq: Every day | ORAL | Status: DC
  Administered 2016-01-14: 1 via ORAL
  Filled 2016-01-14: qty 1

## 2016-01-14 MED ORDER — ROPINIROLE HCL ER 8 MG PO TB24
8.0000 mg | ORAL_TABLET | Freq: Every day | ORAL | Status: DC
  Administered 2016-01-14: 8 mg via ORAL
  Filled 2016-01-14: qty 1

## 2016-01-14 MED ORDER — SERTRALINE HCL 100 MG PO TABS
200.0000 mg | ORAL_TABLET | Freq: Every day | ORAL | Status: DC
  Administered 2016-01-14: 200 mg via ORAL
  Filled 2016-01-14: qty 2

## 2016-01-14 MED ORDER — ATORVASTATIN CALCIUM 10 MG PO TABS: 20.0000 mg | ORAL_TABLET | Freq: Every day | ORAL | Status: DC

## 2016-01-14 MED ORDER — ACETAMINOPHEN 500 MG PO TABS: 500.0000 mg | ORAL_TABLET | Freq: Four times a day (QID) | ORAL | Status: DC | PRN

## 2016-01-14 MED ORDER — SERTRALINE HCL 50 MG PO TABS: 75.0000 mg | ORAL_TABLET | Freq: Every day | ORAL | Status: DC

## 2016-01-14 NOTE — ED Notes (Signed)
Pt transported to CT ?

## 2016-01-14 NOTE — Progress Notes (Signed)
  Echocardiogram 2D Echocardiogram has been performed.  Sharon Jenkins 01/14/2016, 5:23 PM

## 2016-01-14 NOTE — ED Notes (Signed)
Pt transported to MRI 

## 2016-01-14 NOTE — Evaluation (Signed)
Physical Therapy Evaluation Patient Details Name: Sharon Jenkins MRN: [id number] DOB: 15-May-1951 Today's Date: 01/14/2016   History of Present Illness  65 y.o. female with PMH of Parkinson's disease who presented to ED with left side weakness. CT and MRI negative.  Clinical Impression  Pt is at baseline level of function. She demo mod I transfers. Supervision provided for gait and stairs for safety only. No skilled PT intervention indicated. PT signing off.    Follow Up Recommendations No PT follow up;Supervision - Intermittent    Equipment Recommendations  None recommended by PT    Recommendations for Other Services       Precautions / Restrictions Precautions Precautions: None      Mobility  Bed Mobility Overal bed mobility: Modified Independent                Transfers Overall transfer level: Modified independent Equipment used: Ambulation equipment used                Ambulation/Gait Ambulation/Gait assistance: Supervision Ambulation Distance (Feet): 250 Feet Assistive device: Rolling walker (2 wheeled) Gait Pattern/deviations: Step-through pattern;Decreased stride length   Gait velocity interpretation: at or above normal speed for age/gender General Gait Details: steady gait, no LOB  Stairs Stairs: Yes Stairs assistance: Supervision Stair Management: Two rails;Alternating pattern;Forwards Number of Stairs: 11    Wheelchair Mobility    Modified Rankin (Stroke Patients Only) Modified Rankin (Stroke Patients Only) Pre-Morbid Rankin Score: No symptoms Modified Rankin: No symptoms     Balance Overall balance assessment: Needs assistance Sitting-balance support: No upper extremity supported;Feet supported Sitting balance-Leahy Scale: Good     Standing balance support: During functional activity;Bilateral upper extremity supported Standing balance-Leahy Scale: Fair                               Pertinent Vitals/Pain Pain  Assessment: No/denies pain    Home Living Family/patient expects to be discharged to:: Private residence Living Arrangements: Alone Available Help at Discharge: Family;Available PRN/intermittently Type of Home: House Home Access: Stairs to enter Entrance Stairs-Rails: Left Entrance Stairs-Number of Steps: 4 Home Layout: Two level;Able to live on main level with bedroom/bathroom Home Equipment: Walker - 4 wheels Additional Comments: Amb in the house with rollator at times so she has a seat, but normally doesn't use, outside ambulates with 3 wheeled RW, walks her dog and goes to spin class 2x/week    Prior Function Level of Independence: Independent with assistive device(s)               Hand Dominance   Dominant Hand: Right    Extremity/Trunk Assessment   Upper Extremity Assessment: Overall WFL for tasks assessed           Lower Extremity Assessment: Overall WFL for tasks assessed      Cervical / Trunk Assessment: Kyphotic  Communication   Communication: No difficulties  Cognition Arousal/Alertness: Awake/alert Behavior During Therapy: Anxious Overall Cognitive Status: Within Functional Limits for tasks assessed                      General Comments      Exercises        Assessment/Plan    PT Assessment Patent does not need any further PT services  PT Diagnosis Difficulty walking   PT Problem List    PT Treatment Interventions     PT Goals (Current goals can be found in the Care  Plan section) Acute Rehab PT Goals Patient Stated Goal: home today PT Goal Formulation: All assessment and education complete, DC therapy    Frequency     Barriers to discharge        Co-evaluation               End of Session Equipment Utilized During Treatment: Gait belt Activity Tolerance: Patient tolerated treatment well Patient left: in chair;with family/visitor present;with call bell/phone within reach Nurse Communication: Mobility status     Functional Assessment Tool Used: clinical judgement Functional Limitation: Mobility: Walking and moving around Mobility: Walking and Moving Around Current Status ([id number]): At least 1 percent but less than 20 percent impaired, limited or restricted Mobility: Walking and Moving Around Goal Status [phone]): At least 1 percent but less than 20 percent impaired, limited or restricted Mobility: Walking and Moving Around Discharge Status [phone]): At least 1 percent but less than 20 percent impaired, limited or restricted    Time: 0355-9741 PT Time Calculation (min) (ACUTE ONLY): 15 min   Charges:   PT Evaluation $PT Eval Moderate Complexity: 1 Procedure     PT G Codes:   PT G-Codes **NOT FOR INPATIENT CLASS** Functional Assessment Tool Used: clinical judgement Functional Limitation: Mobility: Walking and moving around Mobility: Walking and Moving Around Current Status ([id number]): At least 1 percent but less than 20 percent impaired, limited or restricted Mobility: Walking and Moving Around Goal Status [phone]): At least 1 percent but less than 20 percent impaired, limited or restricted Mobility: Walking and Moving Around Discharge Status [phone]): At least 1 percent but less than 20 percent impaired, limited or restricted    Ilda Foil 01/14/2016, 11:13 AM

## 2016-01-14 NOTE — Progress Notes (Signed)
Accepted patient from ED, accompanied by son, A&O X4, denies pain/discomfort.

## 2016-01-14 NOTE — H&P (Signed)
History and Physical    HARJIT DOUDS ZOX:[id number] DOB: [birth year] DOA: 01/14/2016   PCP: Redmond Baseman, MD Chief Complaint:  Chief Complaint  Patient presents with  . Stroke Symptoms    HPI: Sharon Jenkins is a 65 y.o. female with medical history significant of Parkinson's disease, PAF on eliquis.  Patient presents to the ED with left sided weakness.  LKW 9pm last evening, no h/o stroke or TIA.  No speech change, no facial droop.  Symptoms have been constant since onset.  Nothing makes them better or worse.  ED Course: CT head negative.  Review of Systems: As per HPI otherwise 10 point review of systems negative.    Past Medical History:  Diagnosis Date  . Anxiety   . Asthma   . Atrial fibrillation (HCC)   . Chronic bronchitis (HCC)    "I'm good for a case a year" (05/02/2015)  . Depression   . DVT (deep venous thrombosis) (HCC)    "don't remember which side"  . Parkinson disease (HCC) dx'd ~ 2009  . Sleep apnea    "I chose not to wear a mask" (05/02/2015)  . Spinal stenosis of lumbar region   . Walking pneumonia X 1    Past Surgical History:  Procedure Laterality Date  . ABDOMINAL HYSTERECTOMY  ~ 2006  . BACK SURGERY    . CATARACT EXTRACTION W/ INTRAOCULAR LENS  IMPLANT, BILATERAL Bilateral 1998  . DILATION AND CURETTAGE OF UTERUS  1974  . GENIOPLASTY  ~ 8119-1478   "placed screw and bone matter in my chin & brought it forward"  . KNEE ARTHROSCOPY Left    "meniscus tear"  . LUMBAR DISC ARTHROPLASTY  1991  . TUBAL LIGATION  05/1978  . VARICOSE VEIN SURGERY Right 1987     reports that she has never smoked. She has never used smokeless tobacco. She reports that she does not drink alcohol or use drugs.  Allergies  Allergen Reactions  . Lidocaine Hives    Family History  Problem Relation Age of Onset  . Heart attack Mother   . Hypertension Father   . Stroke Father   . Stroke Sister       Prior to Admission medications   Medication Sig Start  Date End Date Taking? Authorizing Provider  acetaminophen (TYLENOL) 500 MG tablet Take 500 mg by mouth every 6 (six) hours as needed for mild pain.   Yes Historical Provider, MD  Calcium Carbonate-Vit D-Min (CALCIUM 600+D3 PLUS MINERALS PO) Take 1 tablet by mouth daily.   Yes Historical Provider, MD  carbidopa-levodopa (SINEMET IR) 25-100 MG per tablet Take 1.5 tablets by mouth every 3 (three) hours.    Yes Historical Provider, MD  clonazePAM (KLONOPIN) 0.5 MG tablet Take 0.25-0.5 mg by mouth 2 (two) times daily. Take 1/2 tablet by moyth at noon and take 1 tablet by mouth at bedtime   Yes Historical Provider, MD  ELIQUIS 5 MG TABS tablet TAKE 1 TABLET BY MOUTH 2 TIMES DAILY 11/20/15  Yes Newman Nip, NP  Multiple Vitamin (MULTIVITAMIN WITH MINERALS) TABS tablet Take 1 tablet by mouth daily.   Yes Historical Provider, MD  rOPINIRole (REQUIP XL) 8 MG 24 hr tablet Take 8 mg by mouth daily.    Yes Historical Provider, MD  sertraline (ZOLOFT) 100 MG tablet Take 200 mg by mouth daily.   Yes Historical Provider, MD    Physical Exam: Vitals:   01/14/16 0228 01/14/16 0441  BP: 152/82   Pulse: 68  Resp: 16   Temp: 97.9 F (36.6 C) 97.6 F (36.4 C)  TempSrc: Oral   SpO2: 99%       Constitutional: NAD, calm, comfortable Eyes: PERRL, lids and conjunctivae normal ENMT: Mucous membranes are moist. Posterior pharynx clear of any exudate or lesions.Normal dentition.  Neck: normal, supple, no masses, no thyromegaly Respiratory: clear to auscultation bilaterally, no wheezing, no crackles. Normal respiratory effort. No accessory muscle use.  Cardiovascular: Regular rate and rhythm, no murmurs / rubs / gallops. No extremity edema. 2+ pedal pulses. No carotid bruits.  Abdomen: no tenderness, no masses palpated. No hepatosplenomegaly. Bowel sounds positive.  Musculoskeletal: no clubbing / cyanosis. No joint deformity upper and lower extremities. Good ROM, no contractures. Normal muscle tone.  Skin: no  rashes, lesions, ulcers. No induration Neurologic: LUE and LLE diminished sensation to touch, otherwise neurologically intact. Psychiatric: Normal judgment and insight. Alert and oriented x 3. Normal mood.  Flat affect   Labs on Admission: I have personally reviewed following labs and imaging studies  CBC:  Recent Labs Lab 01/14/16 0248 01/14/16 0255  WBC 7.5  --   NEUTROABS 3.4  --   HGB 13.2 13.6  HCT 40.0 40.0  MCV 92.8  --   PLT 229  --    Basic Metabolic Panel:  Recent Labs Lab 01/14/16 0248 01/14/16 0255  NA 139 142  K 3.7 3.7  CL 106 102  CO2 27  --   GLUCOSE 91 90  BUN 10 12  CREATININE 0.75 0.70  CALCIUM 9.9  --    GFR: CrCl cannot be calculated (Unknown ideal weight.). Liver Function Tests:  Recent Labs Lab 01/14/16 0248  AST 26  ALT 5*  ALKPHOS 67  BILITOT 0.4  PROT 6.6  ALBUMIN 4.0   No results for input(s): LIPASE, AMYLASE in the last 168 hours. No results for input(s): AMMONIA in the last 168 hours. Coagulation Profile:  Recent Labs Lab 01/14/16 0248  INR 1.12   Cardiac Enzymes: No results for input(s): CKTOTAL, CKMB, CKMBINDEX, TROPONINI in the last 168 hours. BNP (last 3 results) No results for input(s): PROBNP in the last 8760 hours. HbA1C: No results for input(s): HGBA1C in the last 72 hours. CBG: No results for input(s): GLUCAP in the last 168 hours. Lipid Profile: No results for input(s): CHOL, HDL, LDLCALC, TRIG, CHOLHDL, LDLDIRECT in the last 72 hours. Thyroid Function Tests: No results for input(s): TSH, T4TOTAL, FREET4, T3FREE, THYROIDAB in the last 72 hours. Anemia Panel: No results for input(s): VITAMINB12, FOLATE, FERRITIN, TIBC, IRON, RETICCTPCT in the last 72 hours. Urine analysis:    Component Value Date/Time   COLORURINE YELLOW 05/18/2015 1422   APPEARANCEUR CLEAR 05/18/2015 1422   LABSPEC 1.015 05/18/2015 1422   PHURINE 7.0 05/18/2015 1422   GLUCOSEU NEGATIVE 05/18/2015 1422   HGBUR NEGATIVE 05/18/2015  1422   BILIRUBINUR NEGATIVE 05/18/2015 1422   KETONESUR NEGATIVE 05/18/2015 1422   PROTEINUR NEGATIVE 05/18/2015 1422   UROBILINOGEN 0.2 07/19/2014 1323   NITRITE NEGATIVE 05/18/2015 1422   LEUKOCYTESUR TRACE (A) 05/18/2015 1422   Sepsis Labs: (procalcitonin:4,lacticidven:4) )No results found for this or any previous visit (from the past 240 hour(s)).   Radiological Exams on Admission: Ct Head Wo Contrast  Result Date: 01/14/2016 CLINICAL DATA:  65 year old female code stroke. EXAM: CT HEAD WITHOUT CONTRAST TECHNIQUE: Contiguous axial images were obtained from the base of the skull through the vertex without intravenous contrast. COMPARISON:  Head CT dated 05/18/2015 FINDINGS: The ventricles and sulci appropriate size  for patient's age. Minimal periventricular and deep white matter chronic microvascular ischemic changes noted. There is no acute intracranial hemorrhage. No mass effect or midline shift. There is mild mucoperiosteal thickening of paranasal sinuses with partial opacification of multiple ethmoid air cells as well as partial opacification of the right sphenoid sinus. No air-fluid levels. The mastoid air cells are clear. The calvarium is intact. IMPRESSION: No acute intracranial pathology. If symptoms persist and there are no contraindications, MRI may provide better evaluation if clinically indicated These results were called by telephone at the time of interpretation on 01/14/2016 at 4:16 am to Dr. Rochele Raring , who verbally acknowledged these results. Electronically Signed   By: Elgie Collard M.D.   On: 01/14/2016 04:16   EKG: Independently reviewed.  Assessment/Plan Principal Problem:   Left sided numbness Active Problems:   Parkinson disease (HCC)   PAF (paroxysmal atrial fibrillation) (HCC)   Left sided numbness -  Stroke path way  MRI pending  PAF - continue eliquis for now, 2d echo pending  Parkinson - continue sinemet, hopefully PO, otherwise will  need KO feed   DVT prophylaxis: eliquis Code Status: Full Family Communication: Husband at bedside Consults called: Neuro Admission status: Admit to obs   Rishab Stoudt, Heywood Iles DO [facility] Pager [phone] from 7PM-7AM  If 7AM-7PM, please contact the day physician for the patient [email or web address] Password [email or web address]  01/14/2016, 4:45 AM

## 2016-01-14 NOTE — Progress Notes (Signed)
*  PRELIMINARY RESULTS* Vascular Ultrasound Carotid Duplex (Doppler) has been completed.  Preliminary findings: Bilateral: No significant (1-39%) ICA stenosis. Antegrade vertebral flow.   Farrel Demark, RDMS, RVT  01/14/2016, 9:59 AM

## 2016-01-14 NOTE — Progress Notes (Signed)
STROKE TEAM PROGRESS NOTE   HISTORY OF PRESENT ILLNESS (per record) Sharon Jenkins is an 65 y.o. female with a history of Parkinson's disease, hypertension, deep vein thrombosis, atrial fibrillation on anticoagulation and depression, presenting with new onset feeling of heaviness and numbness involving her left side. Patient was last known well at 9 PM on 01/13/2016. She has no previous history of stroke or TIA. She's been taking Eliquis for anticoagulation. She has not experienced a change in speech. No facial droop has been noted. She had slightly more difficulty than usual was walking. NIH stroke score was 2. CT scan of the head is pending.  LSN: 9 PM on 01/13/2016 tPA Given: No: Beyond time under for treatment consideration; on anticoagulation with Eliquis mRankin:   SUBJECTIVE (INTERVAL HISTORY) Her son from the [place] area is at the bedside.  He stated that he is visiting and his other siblings are more familiar with the patient's healthcare needs.  He did state that after taking to the siblings the event he saw is apparently something she has experienced before and the family has previously perceived the symptoms to be related to her PD.  Overall she feels her condition is rapidly improving. In her ROS she complained of back discomfort   OBJECTIVE Temp:  [97.6 F (36.4 C)-98.6 F (37 C)] 98.6 F (37 C) (07/30 1150) Pulse Rate:  [57-81] 81 (07/30 1150) Cardiac Rhythm: Normal sinus rhythm (07/30 0228) Resp:  [14-20] 20 (07/30 1150) BP: (104-152)/(60-87) 131/75 (07/30 1150) SpO2:  [98 %-100 %] 100 % (07/30 1150)  CBC:   Recent Labs Lab 01/14/16 0248 01/14/16 0255  WBC 7.5  --   NEUTROABS 3.4  --   HGB 13.2 13.6  HCT 40.0 40.0  MCV 92.8  --   PLT 229  --     Basic Metabolic Panel:   Recent Labs Lab 01/14/16 0248 01/14/16 0255  NA 139 142  K 3.7 3.7  CL 106 102  CO2 27  --   GLUCOSE 91 90  BUN 10 12  CREATININE 0.75 0.70  CALCIUM 9.9  --     Lipid  Panel:     Component Value Date/Time   CHOL 158 01/14/2016 0757   TRIG 86 01/14/2016 0757   HDL 47 01/14/2016 0757   CHOLHDL 3.4 01/14/2016 0757   VLDL 17 01/14/2016 0757   LDLCALC 94 01/14/2016 0757   HgbA1c: No results found for: HGBA1C Urine Drug Screen:     Component Value Date/Time   LABOPIA NONE DETECTED 01/14/2016 0407   COCAINSCRNUR NONE DETECTED 01/14/2016 0407   LABBENZ NONE DETECTED 01/14/2016 0407   AMPHETMU NONE DETECTED 01/14/2016 0407   THCU NONE DETECTED 01/14/2016 0407   LABBARB NONE DETECTED 01/14/2016 0407      IMAGING  Dg Chest 2 View 01/14/2016 No active cardiopulmonary disease.     Ct Head Wo Contrast 01/14/2016 No acute intracranial pathology. If symptoms persist and there are no contraindications, MRI may provide better evaluation if clinically indicated     Mr Maxine Glenn Head/brain Wo Cm 01/14/2016  MRI HEAD  Negative brain MRI. No acute intracranial infarct or other process identified.    MRA HEAD  Normal intracranial MRA.    PHYSICAL EXAM HEENT-  Normocephalic, no lesions, without obvious abnormality.   Cardiovascular - regular rate and rhythm, S1, S2 normal, no murmur, click, rub or gallop Lungs - chest clear, no wheezing, rales, normal symmetric air entry Abdomen - soft, non-tender; bowel sounds normal; no masses,  no organomegaly Extremities - no joint deformities, effusion, or inflammation  Neurologic Examination: Mental Status: Sleepy, oriented, thought content appropriate.  Speech slightly slurred without evidence of aphasia. Able to follow commands without difficulty. Cranial Nerves: II-Visual fields were normal to threat III/IV/VI-Pupils were equal and reacted normally to light. Extraocular movements were full and conjugate.    V/VII-no facial numbness and no facial weakness. VIII-normal. X-mild dysarthria with low volume speech. XI: trapezius strength/neck flexion strength normal bilaterally XII-midline tongue extension with  normal strength. Motor: 5/5 bilaterally with normal tone and bulk; of note, no cogwheel rigidity noted on exam.  No resting trmor noted on this exam Sensory: Normal throughout. Cerebellar: Normal finger-to-nose testing.  ASSESSMENT/PLAN [name] is a 65 y.o. female with history of spinal stenosis, obstructive sleep apnea, Parkinson's disease, previous DVT, and atrial fibrillation on Eliquis presenting with left-sided heaviness and numbness. She did not receive IV t-PA due to late presentation and anticoagulation  Possible TIA:  Non-dominant   Resultant  "inability to walk"  MRI  negative  MRA  normal  Carotid Doppler -  Bilateral: No significant (1-39%) ICA stenosis. Antegrade vertebral flow.  2D Echo - pending  LDL - 94  HgbA1c pending  VTE prophylaxis - Eliquis Diet Heart Room service appropriate? Yes; Fluid consistency: Thin  Eliquis (apixaban) daily prior to admission, now on Eliquis (apixaban) daily  Patient counseled to be compliant with her antithrombotic medications  Ongoing aggressive stroke risk factor management  Therapy recommendations:  No follow-up therapies recommended  Disposition:  Pending  Hypertension  Stable  Long-term BP goal normotensive  Hyperlipidemia  Home meds: No lipid lowering medications prior to admission  LDL , goal < 70  Add Lipitor 20 mg daily  Continue statin at discharge   Other Stroke Risk Factors  Advanced age  Obesity, There is no height or weight on file to calculate BMI., recommend weight loss, diet and exercise as appropriate   Family hx stroke (father and sister)  Obstructive sleep apnea  Other Active Problems  DVT history  Hospital day # 0  ATTENDING NOTE: Patient was seen and examined by me personally. Documentation reflects findings. The laboratory and radiographic studies reviewed by me. ROS completed by me personally and pertinent positives fully documented  Condition:  stabilized  Assessment and plan completed by me personally and fully documented above. Plans/Recommendations include:    Given normal MRI and question raised by family regarding event being more related to PD, primary team opted to discuss case with Dr. Hilda Blades who is covering the general Neurology service today.  Plan regarding further studies and discharge will be designed in partnership with Dr. Hilda Blades and primary team  SIGNED BY: Dr. Sula Soda      To contact Stroke Continuity provider, please refer to [email or web address]. After hours, contact General Neurology

## 2016-01-14 NOTE — ED Notes (Signed)
Pt. Initial NIH 1 for dysarthria. No changes to neuro exam during pt. Stay in ED. Pt. Reports using walker at home to ambulate and states typically ambulates with ease. Pt. Son at bedside. Pt. AxO x4, denies pain. Pt. Back from MRI at this time, will be transported upstairs.

## 2016-01-14 NOTE — Evaluation (Signed)
Occupational Therapy Evaluation and Discharge Patient Details Name: Sharon Jenkins MRN: [id number] DOB: 11-07-1950 Today's Date: 01/14/2016    History of Present Illness 65 y.o. female with PMH of Parkinson's disease who presented to ED with left side weakness. CT and MRI negative.   Clinical Impression   Pt reports she was independent with ADL PTA. Currently pt overall supervision for safety with ADL and functional mobility; quickly approaching mod I. Pt mildly unsteady upon initial sit to stand from EOB but her balance progressed with functional mobility. Educated pt on home safety and fall prevention. Pt planning to d/c home with intermittent supervision from family. No further acute OT needs identified; signing off at this time. Please re-consult if needs change. Thank you for this referral.     Follow Up Recommendations  No OT follow up;Supervision - Intermittent    Equipment Recommendations  None recommended by OT    Recommendations for Other Services       Precautions / Restrictions Precautions Precautions: None Restrictions Weight Bearing Restrictions: No      Mobility Bed Mobility Overal bed mobility: Modified Independent                Transfers Overall transfer level: Needs assistance Equipment used: Rolling walker (2 wheeled) Transfers: Sit to/from Stand Sit to Stand: Supervision         General transfer comment: Pt slightly unsteady with initial sit to stand from EOB. No unsteadiness noted with further transfers.    Balance Overall balance assessment: Needs assistance Sitting-balance support: Feet supported;No upper extremity supported Sitting balance-Leahy Scale: Good     Standing balance support: No upper extremity supported;During functional activity Standing balance-Leahy Scale: Fair                              ADL Overall ADL's : Needs assistance/impaired Eating/Feeding: Independent;Sitting   Grooming:  Supervision/safety;Standing;Wash/dry hands;Oral care   Upper Body Bathing: Set up;Sitting   Lower Body Bathing: Supervison/ safety;Sit to/from stand   Upper Body Dressing : Set up;Sitting   Lower Body Dressing: Supervision/safety Lower Body Dressing Details (indicate cue type and reason): standing to don underwear and shorts Toilet Transfer: Supervision/safety;Ambulation;Regular Toilet;RW   Toileting- Clothing Manipulation and Hygiene: Supervision/safety;Sit to/from stand   Tub/ Shower Transfer: Supervision/safety;Tub transfer;Ambulation Tub/Shower Transfer Details (indicate cue type and reason): to sit in bottom of tub Functional mobility during ADLs: Supervision/safety;Rolling walker General ADL Comments: Pt reports she has had a L lateral lean for the past ~1 year; ofeten rests on her L elbow and she has pain and numbness there.     Vision Vision Assessment?: No apparent visual deficits   Perception     Praxis      Pertinent Vitals/Pain Pain Assessment: No/denies pain     Hand Dominance Right   Extremity/Trunk Assessment Upper Extremity Assessment Upper Extremity Assessment: Overall WFL for tasks assessed   Lower Extremity Assessment Lower Extremity Assessment: Overall WFL for tasks assessed   Cervical / Trunk Assessment Cervical / Trunk Assessment: Kyphotic   Communication Communication Communication: No difficulties   Cognition Arousal/Alertness: Awake/alert Behavior During Therapy: Anxious Overall Cognitive Status: Within Functional Limits for tasks assessed                     General Comments       Exercises       Shoulder Instructions      Home Living Family/patient expects to be discharged  to:: Private residence Living Arrangements: Alone Available Help at Discharge: Family;Available PRN/intermittently Type of Home: House Home Access: Stairs to enter [employer] of Steps: 4 Entrance Stairs-Rails: Left Home Layout: Two  level;Able to live on main level with bedroom/bathroom     Bathroom Shower/Tub: Tub only;Walk-in shower (pt reports she typically takes baths)   Bathroom Toilet: Standard     Home Equipment: Walker - 4 wheels   Additional Comments: Amb in the house with rollator at times so she has a seat, but normally doesn't use, outside ambulates with 3 wheeled RW, walks her dog and goes to spin class 2x/week      Prior Functioning/Environment Level of Independence: Independent with assistive device(s)             OT Diagnosis: Generalized weakness   OT Problem List:     OT Treatment/Interventions:      OT Goals(Current goals can be found in the care plan section) Acute Rehab OT Goals Patient Stated Goal: home today OT Goal Formulation: All assessment and education complete, DC therapy  OT Frequency:     Barriers to D/C:            Co-evaluation              End of Session Equipment Utilized During Treatment: Rolling walker Nurse Communication: Mobility status  Activity Tolerance: Patient tolerated treatment well Patient left: in bed;with call bell/phone within reach;with family/visitor present   Time: 1415-1438 OT Time Calculation (min): 23 min Charges:  OT General Charges $OT Visit: 1 Procedure OT Evaluation $OT Eval Moderate Complexity: 1 Procedure OT Treatments $Self Care/Home Management : 8-22 mins G-Codes: OT G-codes **NOT FOR INPATIENT CLASS** Functional Assessment Tool Used: Clinical judgement Functional Limitation: Self care Self Care Current Status (J1791): At least 1 percent but less than 20 percent impaired, limited or restricted Self Care Goal Status (T0569): At least 1 percent but less than 20 percent impaired, limited or restricted Self Care Discharge Status [phone]): At least 1 percent but less than 20 percent impaired, limited or restricted   Gaye Alken M.S., OTR/L Pager: [phone]  01/14/2016, 2:47 PM

## 2016-01-14 NOTE — Discharge Summary (Signed)
Physician Discharge Summary  Sharon Jenkins QIO:[id number] DOB: 1950-08-27 DOA: 01/14/2016  PCP: Redmond Baseman, MD  Admit date: 01/14/2016 Discharge date: 01/14/2016  Time spent: 30 minutes  Recommendations for Outpatient Follow-up:  1. Follow-up with PCP in one week    Discharge Diagnoses:  Principal Problem:   Left sided numbness Active Problems:   Parkinson disease (HCC)   PAF (paroxysmal atrial fibrillation) (HCC)   Discharge Condition: Stable  Diet recommendation: Cardiac  History of present illness and Hospital Course:  Sharon Jenkins is a 65 y.o. female with medical history significant of Parkinson's disease, PAF on eliquis  presents to the ED with left sided weakness.  No speech change, no facial droop.  Symptoms have been constant since onset. MRI brain negative for any acute stroke. Carotid Dopplers did not show any significant stenotic changes. Echocardiogram done from November 2016 did not show any valvular abnormalities. Neurology followed the patient as patient is already on Eliquis patient could be discharged home. Patient will need to follow-up with PCP in one week   Consultations:  Neurology  Discharge Exam: Vitals:   01/14/16 0950 01/14/16 1150  BP: 129/71 131/75  Pulse: 73 81  Resp: 16 20  Temp: 97.9 F (36.6 C) 98.6 F (37 C)    General: A XO X3 Cardiovascular:S1 S2  Respiratory: B/L clear   Discharge Instructions  Follow-up with PCP in one week  Current Discharge Medication List    CONTINUE these medications which have NOT CHANGED   Details  acetaminophen (TYLENOL) 500 MG tablet Take 500 mg by mouth every 6 (six) hours as needed for mild pain.    Calcium Carbonate-Vit D-Min (CALCIUM 600+D3 PLUS MINERALS PO) Take 1 tablet by mouth daily.    carbidopa-levodopa (SINEMET IR) 25-100 MG per tablet Take 1.5 tablets by mouth every 3 (three) hours.     clonazePAM (KLONOPIN) 0.5 MG tablet Take 0.25-0.5 mg by mouth 2 (two) times daily. Take  1/2 tablet by moyth at noon and take 1 tablet by mouth at bedtime    ELIQUIS 5 MG TABS tablet TAKE 1 TABLET BY MOUTH 2 TIMES DAILY Qty: 60 tablet, Refills: 6    Multiple Vitamin (MULTIVITAMIN WITH MINERALS) TABS tablet Take 1 tablet by mouth daily.    rOPINIRole (REQUIP XL) 8 MG 24 hr tablet Take 8 mg by mouth daily.     sertraline (ZOLOFT) 100 MG tablet Take 200 mg by mouth daily.       Allergies  Allergen Reactions  . Lidocaine Hives      The results of significant diagnostics from this hospitalization (including imaging, microbiology, ancillary and laboratory) are listed below for reference.    Significant Diagnostic Studies: Dg Chest 2 View  Result Date: 01/14/2016 CLINICAL DATA:  Left-sided heaviness, headache. EXAM: CHEST  2 VIEW COMPARISON:  10/13/2015 FINDINGS: Heart and mediastinal contours are within normal limits. No focal opacities or effusions. No acute bony abnormality. Degenerative spurring in the thoracic spine. Probable resection of the distal right clavicle. IMPRESSION: No active cardiopulmonary disease. Electronically Signed   By: Charlett Nose M.D.   On: 01/14/2016 07:07  Ct Head Wo Contrast  Result Date: 01/14/2016 CLINICAL DATA:  65 year old female code stroke. EXAM: CT HEAD WITHOUT CONTRAST TECHNIQUE: Contiguous axial images were obtained from the base of the skull through the vertex without intravenous contrast. COMPARISON:  Head CT dated 05/18/2015 FINDINGS: The ventricles and sulci appropriate size for patient's age. Minimal periventricular and deep white matter chronic microvascular ischemic changes noted.  There is no acute intracranial hemorrhage. No mass effect or midline shift. There is mild mucoperiosteal thickening of paranasal sinuses with partial opacification of multiple ethmoid air cells as well as partial opacification of the right sphenoid sinus. No air-fluid levels. The mastoid air cells are clear. The calvarium is intact. IMPRESSION: No acute  intracranial pathology. If symptoms persist and there are no contraindications, MRI may provide better evaluation if clinically indicated These results were called by telephone at the time of interpretation on 01/14/2016 at 4:16 am to Dr. Rochele Raring , who verbally acknowledged these results. Electronically Signed   By: Elgie Collard M.D.   On: 01/14/2016 04:16  Mr Brain Wo Contrast  Result Date: 01/14/2016 CLINICAL DATA:  Initial evaluation for acute left-sided weakness. EXAM: MRI HEAD WITHOUT CONTRAST MRA HEAD WITHOUT CONTRAST TECHNIQUE: Multiplanar, multiecho pulse sequences of the brain and surrounding structures were obtained without intravenous contrast. Angiographic images of the head were obtained using MRA technique without contrast. COMPARISON:  Prior CT from earlier the same day. FINDINGS: MRI HEAD FINDINGS Cerebral volume normal for patient age. No significant cerebral white matter disease present. No abnormal foci of restricted diffusion to suggest acute ischemia. Gray-white matter differentiation well maintained. Major intracranial vascular flow voids are preserved. No acute or chronic intracranial hemorrhage. No areas of chronic infarction. No mass lesion, midline shift, or mass effect. No hydrocephalus. No extra-axial fluid collection. Major dural sinuses are grossly patent. Craniocervical junction normal. Visualized upper cervical spine unremarkable. Pituitary gland normal. No acute abnormality about the globes and orbits. Patient is status post lens extraction bilaterally. Mild scattered mucosal thickening within the ethmoidal air cells and maxillary sinuses. Minimal layering opacity within the right sphenoid sinus. Paranasal sinuses are otherwise clear. No significant mastoid effusion. Inner ear structures grossly normal. Bone marrow signal intensity within normal limits. No scalp soft tissue abnormality. MRA HEAD FINDINGS ANTERIOR CIRCULATION: Distal cervical segments of the internal  carotid arteries are patent with antegrade flow. Petrous, cavernous, and supraclinoid segments widely patent without flow-limiting stenosis. Left A1 segment widely patent. Right A1 segment absent/hypoplastic. Anterior communicating artery normal. Anterior cerebral arteries well opacified. M1 segments patent without stenosis or occlusion. MCA bifurcations normal. Distal MCA branches well opacified and symmetric. POSTERIOR CIRCULATION: Right vertebral artery dominant and patent to the vertebrobasilar junction. Diminutive left vertebral artery terminates in PICA. Posterior inferior cerebral arteries themselves are patent bilaterally. Basilar artery mildly tortuous but widely patent to its distal aspect. Superior cerebral arteries patent bilaterally. Left PCA arises from the basilar artery and is well opacified to its distal aspect. Small left posterior communicating artery noted. Fetal type right PCA supplied via a widely patent right posterior communicating artery. Right PCA also supplied to its distal aspect. No aneurysm or vascular malformation. IMPRESSION: MRI HEAD IMPRESSION: Negative brain MRI. No acute intracranial infarct or other process identified. MRA HEAD IMPRESSION: Normal intracranial MRA. Electronically Signed   By: Rise Mu M.D.   On: 01/14/2016 07:24  Mr Maxine Glenn Head/brain JX Cm  Result Date: 01/14/2016 CLINICAL DATA:  Initial evaluation for acute left-sided weakness. EXAM: MRI HEAD WITHOUT CONTRAST MRA HEAD WITHOUT CONTRAST TECHNIQUE: Multiplanar, multiecho pulse sequences of the brain and surrounding structures were obtained without intravenous contrast. Angiographic images of the head were obtained using MRA technique without contrast. COMPARISON:  Prior CT from earlier the same day. FINDINGS: MRI HEAD FINDINGS Cerebral volume normal for patient age. No significant cerebral white matter disease present. No abnormal foci of restricted diffusion to suggest acute ischemia.  Gray-white matter  differentiation well maintained. Major intracranial vascular flow voids are preserved. No acute or chronic intracranial hemorrhage. No areas of chronic infarction. No mass lesion, midline shift, or mass effect. No hydrocephalus. No extra-axial fluid collection. Major dural sinuses are grossly patent. Craniocervical junction normal. Visualized upper cervical spine unremarkable. Pituitary gland normal. No acute abnormality about the globes and orbits. Patient is status post lens extraction bilaterally. Mild scattered mucosal thickening within the ethmoidal air cells and maxillary sinuses. Minimal layering opacity within the right sphenoid sinus. Paranasal sinuses are otherwise clear. No significant mastoid effusion. Inner ear structures grossly normal. Bone marrow signal intensity within normal limits. No scalp soft tissue abnormality. MRA HEAD FINDINGS ANTERIOR CIRCULATION: Distal cervical segments of the internal carotid arteries are patent with antegrade flow. Petrous, cavernous, and supraclinoid segments widely patent without flow-limiting stenosis. Left A1 segment widely patent. Right A1 segment absent/hypoplastic. Anterior communicating artery normal. Anterior cerebral arteries well opacified. M1 segments patent without stenosis or occlusion. MCA bifurcations normal. Distal MCA branches well opacified and symmetric. POSTERIOR CIRCULATION: Right vertebral artery dominant and patent to the vertebrobasilar junction. Diminutive left vertebral artery terminates in PICA. Posterior inferior cerebral arteries themselves are patent bilaterally. Basilar artery mildly tortuous but widely patent to its distal aspect. Superior cerebral arteries patent bilaterally. Left PCA arises from the basilar artery and is well opacified to its distal aspect. Small left posterior communicating artery noted. Fetal type right PCA supplied via a widely patent right posterior communicating artery. Right PCA also supplied to its distal  aspect. No aneurysm or vascular malformation. IMPRESSION: MRI HEAD IMPRESSION: Negative brain MRI. No acute intracranial infarct or other process identified. MRA HEAD IMPRESSION: Normal intracranial MRA. Electronically Signed   By: Rise Mu M.D.   On: 01/14/2016 07:24   Microbiology: No results found for this or any previous visit (from the past 240 hour(s)).   Labs: Basic Metabolic Panel:  Recent Labs Lab 01/14/16 0248 01/14/16 0255  NA 139 142  K 3.7 3.7  CL 106 102  CO2 27  --   GLUCOSE 91 90  BUN 10 12  CREATININE 0.75 0.70  CALCIUM 9.9  --    Liver Function Tests:  Recent Labs Lab 01/14/16 0248  AST 26  ALT 5*  ALKPHOS 67  BILITOT 0.4  PROT 6.6  ALBUMIN 4.0   No results for input(s): LIPASE, AMYLASE in the last 168 hours. No results for input(s): AMMONIA in the last 168 hours. CBC:  Recent Labs Lab 01/14/16 0248 01/14/16 0255  WBC 7.5  --   NEUTROABS 3.4  --   HGB 13.2 13.6  HCT 40.0 40.0  MCV 92.8  --   PLT 229  --    Cardiac Enzymes: No results for input(s): CKTOTAL, CKMB, CKMBINDEX, TROPONINI in the last 168 hours. BNP: BNP (last 3 results) No results for input(s): BNP in the last 8760 hours.  ProBNP (last 3 results) No results for input(s): PROBNP in the last 8760 hours.  CBG: No results for input(s): GLUCAP in the last 168 hours.     SignedSusa Griffins MD.  [facility] 01/14/2016, 2:38 PM

## 2016-01-14 NOTE — ED Provider Notes (Signed)
By signing my name below, I, Bethel Born, attest that this documentation has been prepared under the direction and in the presence of Caleel Kiner N Franshesca Chipman, DO. Electronically Signed: Bethel Born, ED Scribe. 01/14/16. 3:39 AM.  TIME SEEN: 2:29 AM   CHIEF COMPLAINT: Left sided weakness and numbness  HPI: Sharon Jenkins is a 65 y.o. female with PMHx of Parkinson's disease, a-fib on Eliquis, DVT, and anxiety  who presents to the Emergency Department complaining of numbness and weakness in the left arm and leg with onset near 6:30 PM yesterday. When she woke up near 11PM last night her symptoms were worse.  Pt states that she has had similar symptoms in the past but this is worse.  Associated symptoms include new right posterior headache and feeling flushed. Pt denies chest pain, SOB, vomiting, and diarrhea. States she's had similar symptoms in the past but that they normally get better after taking her Parkinson medicine and tonight but did not. She is not aware that she's ever had a stroke or intracranial hemorrhage before.   ROS: See HPI Constitutional: no fever  Eyes: no drainage  ENT: no runny nose   Cardiovascular:  no chest pain  Resp: no SOB  GI: no vomiting GU: no dysuria Integumentary: no rash  Allergy: no hives  Musculoskeletal: no leg swelling  Neurological: no slurred speech ROS otherwise negative  PAST MEDICAL HISTORY/PAST SURGICAL HISTORY:  Past Medical History:  Diagnosis Date  . Anxiety   . Asthma   . Atrial fibrillation (HCC)   . Chronic bronchitis (HCC)    "I'm good for a case a year" (05/02/2015)  . Depression   . DVT (deep venous thrombosis) (HCC)    "don't remember which side"  . Parkinson disease (HCC) dx'd ~ 2009  . Sleep apnea    "I chose not to wear a mask" (05/02/2015)  . Spinal stenosis of lumbar region   . Walking pneumonia X 1    MEDICATIONS:  Prior to Admission medications   Medication Sig Start Date End Date Taking? Authorizing Provider   Calcium Carbonate-Vit D-Min (CALCIUM 600+D3 PLUS MINERALS PO) Take 1 tablet by mouth daily.    Historical Provider, MD  carbidopa-levodopa (SINEMET IR) 25-100 MG per tablet Take 1.5 tablets by mouth every 3 (three) hours. Pt takes 1.5 mg tablet every 2-3 hrs    Historical Provider, MD  clonazePAM (KLONOPIN) 0.5 MG tablet Take 0.25-0.5 mg by mouth 2 (two) times daily. Take 1/2 tablet by moyth at noon and take 1 tablet by mouth at bedtime    Historical Provider, MD  ELIQUIS 5 MG TABS tablet TAKE 1 TABLET BY MOUTH 2 TIMES DAILY 11/20/15   Newman Nip, NP  Multiple Vitamin (MULTIVITAMINS PO) Take 1 tablet by mouth daily.    Historical Provider, MD  Polyvinyl Alcohol-Povidone (REFRESH OP) Place 1 drop into both eyes daily as needed (dry eyes).     Historical Provider, MD  rOPINIRole (REQUIP XL) 8 MG 24 hr tablet Take 8 mg by mouth daily.     Historical Provider, MD  sertraline (ZOLOFT) 50 MG tablet Take 75 mg by mouth daily. Pt takes 1.5 tablets daily    Historical Provider, MD    ALLERGIES:  Allergies  Allergen Reactions  . Lidocaine Hives    SOCIAL HISTORY:  Social History  Substance Use Topics  . Smoking status: Never Smoker  . Smokeless tobacco: Never Used  . Alcohol use No    FAMILY HISTORY: Family History  Problem Relation  Age of Onset  . Heart attack Mother   . Hypertension Father   . Stroke Father   . Stroke Sister     EXAM: BP 152/82 (BP Location: Left Arm)   Pulse 68   Temp 97.9 F (36.6 C) (Oral)   Resp 16   SpO2 99%  CONSTITUTIONAL: Alert and oriented 3 and responds slowly but appropriately to questions Which she reports is chronic due to Parkinsons. Well-appearing; well-nourished, afebrile and nontoxic HEAD: Normocephalic EYES: Conjunctivae clear, PERRL ENT: normal nose; no rhinorrhea; moist mucous membranes NECK: Supple, no meningismus, no LAD  CARD: RRR; S1 and S2 appreciated; no murmurs, no clicks, no rubs, no gallops RESP: Normal chest excursion without  splinting or tachypnea; breath sounds clear and equal bilaterally; no wheezes, no rhonchi, no rales, no hypoxia or respiratory distress, speaking full sentences ABD/GI: Normal bowel sounds; non-distended; soft, non-tender, no rebound, no guarding, no peritoneal signs BACK:  The back appears normal and is non-tender to palpation, there is no CVA tenderness EXT: Normal ROM in all joints; non-tender to palpation; no edema; normal capillary refill; no cyanosis, no calf tenderness or swelling    SKIN: Normal color for age and race; warm; no rash NEURO: Moves all extremities equally, sensation to light touch in the left arm and left leg slightly diminished but otherwise intact , cranial nerves II through XII intact, no pronator drift, alert and oriented X 3 PSYCH: The patient's mood and manner are appropriate. Grooming and personal hygiene are appropriate.  MEDICAL DECISION MAKING: Patient here initially called out as a code stroke by EMS. She is outside the TPA window given last seen normal was over 8 hours ago. Code stroke canceled. Patient seen by Dr. Roseanne Reno. Recommends MRI of patient's brain. Head CT is negative. Also recommends medicine admission.  ED PROGRESS: Patient's labs are unremarkable. Discussed with Dr. Julian Reil with hospitalist service for admission to telemetry, observation. I will place holding orders per his request.   I reviewed all nursing notes, vitals, pertinent old records, EKGs, labs, imaging (as available).     EKG Interpretation  Date/Time:  Sunday January 14 2016 03:38:28 EDT Ventricular Rate:  65 PR Interval:    QRS Duration: 95 QT Interval:  461 QTC Calculation: 480 R Axis:   -20 Text Interpretation:  Sinus rhythm Borderline left axis deviation Probable anteroseptal infarct, old Baseline wander in lead(s) V5 V6 No significant change since last tracing Confirmed by Amaliya Whitelaw,  DO, Kalyiah Saintil ([id number]) on 01/14/2016 3:43:18 AM        I personally performed the services  described in this documentation, which was scribed in my presence. The recorded information has been reviewed and is accurate.      Layla Maw Ladawna Walgren, DO 01/14/16 [phone]

## 2016-01-14 NOTE — ED Notes (Signed)
Per EMS, pt from home. Pt was eating this evening with son and pt began to experience left side heaviness and headache. LSN 1830. Pt is outside of code stroke window per Neurologist and time of LSN. With EMS, pt has slow speech but no other neuro deficits. Pt alert and oriented x 4. VSS.

## 2016-01-14 NOTE — Progress Notes (Signed)
PT Cancellation Note  Patient Details Name: LENNICE DUBOSE MRN: [id number] DOB: 26-Jun-1950   Cancelled Treatment:    Reason Eval/Treat Not Completed: Patient at procedure or test/unavailable. Pt in ECHO. Will re-attempt later today as time allows.   Ilda Foil 01/14/2016, 9:48 AM

## 2016-01-14 NOTE — Consult Note (Signed)
Admission H&P    Chief Complaint: Left-sided numbness and heaviness.  HPI: Sharon Jenkins is an 65 y.o. female with a history of Parkinson's disease, hypertension, deep vein thrombosis, atrial fibrillation on anticoagulation and depression, presenting with new onset feeling of heaviness and numbness involving her left side. Patient was last known well at 9 PM on 01/13/2016. She has no previous history of stroke or TIA. She's been taking Eliquis for anticoagulation. She has not experienced a change in speech. No facial droop has been noted. She had slightly more difficulty than usual was walking. NIH stroke score was 2. CT scan of the head is pending.  LSN: 9 PM on 01/13/2016 tPA Given: No: Beyond time under for treatment consideration; on anticoagulation with Eliquis mRankin:  Past Medical History:  Diagnosis Date  . Anxiety   . Asthma   . Atrial fibrillation ([place])   . Chronic bronchitis ([place])    "I'm good for a case a year" (05/02/2015)  . Depression   . DVT (deep venous thrombosis) ([place])    "don't remember which side"  . Parkinson disease ([place]) dx'd ~ 2009  . Sleep apnea    "I chose not to wear a mask" (05/02/2015)  . Spinal stenosis of lumbar region   . Walking pneumonia X 1    Past Surgical History:  Procedure Laterality Date  . ABDOMINAL HYSTERECTOMY  ~ 2006  . BACK SURGERY    . CATARACT EXTRACTION W/ INTRAOCULAR LENS  IMPLANT, BILATERAL Bilateral 1998  . DILATION AND CURETTAGE OF UTERUS  1974  . GENIOPLASTY  ~ 6734-1937   "placed screw and bone matter in my chin & brought it forward"  . KNEE ARTHROSCOPY Left    "meniscus tear"  . [place] ARTHROPLASTY  1991  . TUBAL LIGATION  05/1978  . VARICOSE VEIN SURGERY Right 1987    Family History  Problem Relation Age of Onset  . Heart attack Mother   . Hypertension Father   . Stroke Father   . Stroke Sister    Social History:  reports that she has never smoked. She has never used smokeless tobacco. She reports that she  does not drink alcohol or use drugs.  Allergies:  Allergies  Allergen Reactions  . Lidocaine Hives    Medications: Preadmission medications were reviewed by me.  ROS: History obtained from the patient  General ROS: negative for - chills, fatigue, fever, night sweats, weight gain or weight loss Psychological ROS: negative for - behavioral disorder, hallucinations, memory difficulties, mood swings or suicidal ideation Ophthalmic ROS: negative for - blurry vision, double vision, eye pain or loss of vision ENT ROS: negative for - epistaxis, nasal discharge, oral lesions, sore throat, tinnitus or vertigo Allergy and Immunology ROS: negative for - hives or itchy/watery eyes Hematological and Lymphatic ROS: negative for - bleeding problems, bruising or swollen lymph nodes Endocrine ROS: negative for - galactorrhea, hair pattern changes, polydipsia/polyuria or temperature intolerance Respiratory ROS: negative for - cough, hemoptysis, shortness of breath or wheezing Cardiovascular ROS: negative for - chest pain, dyspnea on exertion, edema or irregular heartbeat Gastrointestinal ROS: negative for - abdominal pain, diarrhea, hematemesis, nausea/vomiting or stool incontinence Genito-Urinary ROS: negative for - dysuria, hematuria, incontinence or urinary frequency/urgency Musculoskeletal ROS: negative for - joint swelling or muscular weakness Neurological ROS: as noted in HPI Dermatological ROS: negative for rash and skin lesion changes  Physical Examination: Blood pressure 152/82, pulse 68, temperature 97.9 F (36.6 C), temperature source Oral, resp. rate 16, SpO2 99 %.  HEENT-  Normocephalic, no lesions, without obvious abnormality.  Normal external eye and conjunctiva.  Normal TM's bilaterally.  Normal auditory canals and external ears. Normal external nose, mucus membranes and septum.  Normal pharynx. Neck supple with no masses, nodes, nodules or enlargement. Cardiovascular - regular rate  and rhythm, S1, S2 normal, no murmur, click, rub or gallop Lungs - chest clear, no wheezing, rales, normal symmetric air entry Abdomen - soft, non-tender; bowel sounds normal; no masses,  no organomegaly Extremities - no joint deformities, effusion, or inflammation  Neurologic Examination: Mental Status: Alert, oriented, thought content appropriate.  Speech slightly slurred without evidence of aphasia. Able to follow commands without difficulty. Cranial Nerves: II-Visual fields were normal. III/IV/VI-Pupils were equal and reacted normally to light. Extraocular movements were full and conjugate.    V/VII-no facial numbness and no facial weakness. VIII-normal. X-mild dysarthria with low volume speech. XI: trapezius strength/neck flexion strength normal bilaterally XII-midline tongue extension with normal strength. Motor: 5/5 bilaterally with normal tone and bulk Sensory: Normal throughout. Deep Tendon Reflexes: 1+ and symmetric. Plantars: Flexor bilaterally Cerebellar: Normal finger-to-nose testing. Carotid auscultation: Normal  Results for orders placed or performed during the hospital encounter of 01/14/16 (from the past 48 hour(s))  Ethanol     Status: None   Collection Time: 01/14/16  2:48 AM  Result Value Ref Range   Alcohol, Ethyl (B) <5 <5 mg/dL    Comment:        LOWEST DETECTABLE LIMIT FOR SERUM ALCOHOL IS 5 mg/dL FOR MEDICAL PURPOSES ONLY   Protime-INR     Status: None   Collection Time: 01/14/16  2:48 AM  Result Value Ref Range   Prothrombin Time 14.4 11.4 - 15.2 seconds   INR 1.12   APTT     Status: None   Collection Time: 01/14/16  2:48 AM  Result Value Ref Range   aPTT 33 24 - 36 seconds  CBC     Status: None   Collection Time: 01/14/16  2:48 AM  Result Value Ref Range   WBC 7.5 4.0 - 10.5 K/uL   RBC 4.31 3.87 - 5.11 MIL/uL   Hemoglobin 13.2 12.0 - 15.0 g/dL   HCT 40.0 36.0 - 46.0 %   MCV 92.8 78.0 - 100.0 fL   MCH 30.6 26.0 - 34.0 pg   MCHC 33.0 30.0 -  36.0 g/dL   RDW 12.8 11.5 - 15.5 %   Platelets 229 150 - 400 K/uL  Differential     Status: None   Collection Time: 01/14/16  2:48 AM  Result Value Ref Range   Neutrophils Relative % 45 %   Neutro Abs 3.4 1.7 - 7.7 K/uL   Lymphocytes Relative 40 %   Lymphs Abs 3.0 0.7 - 4.0 K/uL   Monocytes Relative 10 %   Monocytes Absolute 0.7 0.1 - 1.0 K/uL   Eosinophils Relative 5 %   Eosinophils Absolute 0.4 0.0 - 0.7 K/uL   Basophils Relative 0 %   Basophils Absolute 0.0 0.0 - 0.1 K/uL  Comprehensive metabolic panel     Status: Abnormal   Collection Time: 01/14/16  2:48 AM  Result Value Ref Range   Sodium 139 135 - 145 mmol/L   Potassium 3.7 3.5 - 5.1 mmol/L   Chloride 106 101 - 111 mmol/L   CO2 27 22 - 32 mmol/L   Glucose, Bld 91 65 - 99 mg/dL   BUN 10 6 - 20 mg/dL   Creatinine, Ser 0.75 0.44 - 1.00 mg/dL  Calcium 9.9 8.9 - 10.3 mg/dL   Total Protein 6.6 6.5 - 8.1 g/dL   Albumin 4.0 3.5 - 5.0 g/dL   AST 26 15 - 41 U/L   ALT 5 (L) 14 - 54 U/L   Alkaline Phosphatase 67 38 - 126 U/L   Total Bilirubin 0.4 0.3 - 1.2 mg/dL   GFR calc non Af Amer >60 >60 mL/min   GFR calc Af Amer >60 >60 mL/min    Comment: (NOTE) The eGFR has been calculated using the CKD EPI equation. This calculation has not been validated in all clinical situations. eGFR's persistently <60 mL/min signify possible Chronic Kidney Disease.    Anion gap 6 5 - 15  I-stat troponin, ED (not at [facility], [facility])     Status: None   Collection Time: 01/14/16  2:53 AM  Result Value Ref Range   Troponin i, poc 0.01 0.00 - 0.08 ng/mL   Comment 3            Comment: Due to the release kinetics of cTnI, a negative result within the first hours of the onset of symptoms does not rule out myocardial infarction with certainty. If myocardial infarction is still suspected, repeat the test at appropriate intervals.   I-Stat Chem 8, ED  (not at [facility], [facility])     Status: None   Collection Time: 01/14/16  2:55 AM  Result Value Ref Range    Sodium 142 135 - 145 mmol/L   Potassium 3.7 3.5 - 5.1 mmol/L   Chloride 102 101 - 111 mmol/L   BUN 12 6 - 20 mg/dL   Creatinine, Ser 0.70 0.44 - 1.00 mg/dL   Glucose, Bld 90 65 - 99 mg/dL   Calcium, Ion 1.21 1.12 - 1.23 mmol/L   TCO2 26 0 - 100 mmol/L   Hemoglobin 13.6 12.0 - 15.0 g/dL   HCT 40.0 36.0 - 46.0 %   No results found.  Assessment: 65 y.o. female with multiple risk factors for stroke presenting with probable acute right subcortical ischemic infarction.  Stroke Risk Factors - atrial fibrillation, family history and hypertension  Plan: 1. HgbA1c, fasting lipid panel 2. MRI, MRA  of the brain without contrast 3. PT consult, OT consult, Speech consult 4. Echocardiogram 5. Carotid dopplers 6. Prophylactic therapy-Anticoagulation: Coumadin-Eliquis 7. Risk factor modification 8. Telemetry monitoring  C.R. Nicole Kindred, MD [facility] [phone]  01/14/2016, 3:39 AM

## 2016-01-14 NOTE — Progress Notes (Signed)
Patient refused her q2 vitals and Neuro assessment, states that she wants to go home, removed some leads from tele monitor. MD is notified-Will continue to monitor

## 2016-01-14 NOTE — Progress Notes (Signed)
Discharge reviewed with patient

## 2016-01-15 LAB — ECHOCARDIOGRAM COMPLETE
CHL CUP MV DEC (S): 158
E decel time: 158 msec
E/e' ratio: 8.53
FS: 25 % — AB (ref 28–44)
IV/PV OW: 0.86
LA vol A4C: 40 ml
LA vol index: 26.1 mL/m2
LA vol: 46 mL
LADIAMINDEX: 2.22 cm/m2
LASIZE: 39 mm
LEFT ATRIUM END SYS DIAM: 39 mm
LVEEAVG: 8.53
LVEEMED: 8.53
LVELAT: 7.62 cm/s
LVOT area: 3.14 cm2
LVOTD: 20 mm
MV pk E vel: 65 m/s
MVPKAVEL: 96 m/s
PW: 9.13 mm — AB (ref 0.6–1.1)
RV LATERAL S' VELOCITY: 14 cm/s
TAPSE: 22.6 mm
TDI e' lateral: 7.62
TDI e' medial: 5.66

## 2016-01-17 LAB — HEMOGLOBIN A1C
Hgb A1c MFr Bld: 5.4 % (ref 4.8–5.6)
MEAN PLASMA GLUCOSE: 108 mg/dL

## 2016-01-18 LAB — VAS US CAROTID
LCCAPDIAS: 20 cm/s
LCCAPSYS: 74 cm/s
LEFT ECA DIAS: -14 cm/s
LEFT VERTEBRAL DIAS: 11 cm/s
LICADDIAS: -25 cm/s
Left CCA dist dias: 10 cm/s
Left CCA dist sys: 63 cm/s
Left ICA dist sys: -81 cm/s
Left ICA prox dias: -19 cm/s
Left ICA prox sys: -62 cm/s
RCCADSYS: -71 cm/s
RCCAPDIAS: 15 cm/s
RIGHT ECA DIAS: 12 cm/s
RIGHT VERTEBRAL DIAS: 11 cm/s
Right CCA prox sys: 74 cm/s

## 2016-01-22 ENCOUNTER — Ambulatory Visit (INDEPENDENT_AMBULATORY_CARE_PROVIDER_SITE_OTHER): Payer: Medicare Other | Admitting: Neurology

## 2016-01-22 ENCOUNTER — Encounter: Payer: Self-pay | Admitting: Neurology

## 2016-01-22 VITALS — BP 110/66 | HR 75 | Ht 65.0 in | Wt 144.0 lb

## 2016-01-22 DIAGNOSIS — M542 Cervicalgia: Secondary | ICD-10-CM

## 2016-01-22 DIAGNOSIS — G4752 REM sleep behavior disorder: Secondary | ICD-10-CM

## 2016-01-22 DIAGNOSIS — G20 Parkinson's disease: Secondary | ICD-10-CM | POA: Diagnosis not present

## 2016-01-22 DIAGNOSIS — R292 Abnormal reflex: Secondary | ICD-10-CM

## 2016-01-22 DIAGNOSIS — Z9889 Other specified postprocedural states: Secondary | ICD-10-CM

## 2016-01-22 DIAGNOSIS — M5136 Other intervertebral disc degeneration, lumbar region: Secondary | ICD-10-CM

## 2016-01-22 MED ORDER — ROPINIROLE HCL ER 6 MG PO TB24: 1.0000 | ORAL_TABLET | Freq: Two times a day (BID) | ORAL | 1 refills | Status: DC
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
## 2016-01-22 NOTE — Patient Instructions (Signed)
1. We have sent a referral to [facility] for your MRI and they will call you directly to schedule your appt. They are located at [address] If you need to contact them directly please call [phone].  2. Change Requip to 6 mg XL twice daily. Prescription sent to your pharmacy.

## 2016-01-24 ENCOUNTER — Telehealth: Payer: Self-pay | Admitting: Neurology

## 2016-01-24 NOTE — Telephone Encounter (Signed)
Patient called to make sure she could take another Requip now. She took one at 5 am and thought she had to wait 12 hours. I explained that taking medication twice daily is during her waking hours and could definitely take her second tablet now. She will call back with any questions.

## 2016-01-30 ENCOUNTER — Telehealth: Payer: Self-pay | Admitting: Neurology

## 2016-01-30 ENCOUNTER — Telehealth: Payer: Self-pay | Admitting: *Deleted

## 2016-01-30 NOTE — Telephone Encounter (Signed)
PT left a message regarding her medication/Dawn CB# [phone]

## 2016-01-30 NOTE — Telephone Encounter (Signed)
Spoke with patient - slurred speech comes and goes. Offered speech therapy. She thinks her symptoms are just from stress. Her psychiatrist wants to switch her antidepressant and she wants to know if that is okay. She didn't know the name of it. She is calling back with the name of medication.

## 2016-01-30 NOTE — Telephone Encounter (Signed)
Patient made aware.   Gave her information for [facility].  [facility] [address]  [place], [place] [id number] [phone]

## 2016-01-30 NOTE — Telephone Encounter (Signed)
April (after hours nurse) called to request for someone to talk to the patient.  She said that she was slurring her words, had trouble sleeping and lost control of her bladder last night.  I spoke with patient and she relayed the same information to me.  She said that she wasn't sure if it was due to her medication change.  Informed her that I would speak with Dr. Arbutus Leasat and call her back.  [phone]

## 2016-01-30 NOTE — Telephone Encounter (Signed)
Left message on machine for patient to call back.

## 2016-01-30 NOTE — Telephone Encounter (Signed)
I have no objection (she told me that name when she was here) but sounds like she could use a counselor as well.  Does she have one?  If not, give Babette RelicJulie Alberts contact info

## 2016-01-30 NOTE — Telephone Encounter (Signed)
Loss of bladder control in middle of night wouldn't be from the requip change, nor would trouble sleeping.  If she is having ACUTE onset of slurring speech all of the time, should be evaluated in ED, but if comes and goes, could be from the PD and may just need ST for PD

## 2016-01-30 NOTE — Telephone Encounter (Signed)
Patient called back - she is to start Viibryd. Okay to start?

## 2016-01-30 NOTE — Telephone Encounter (Signed)
PT returned your call/Dawn CB#[phone]

## 2016-01-31 IMAGING — DX DG CHEST 2V
2 series · 2 of 2 positions shown · non-contrast
Comparison: May 02, 2015

CLINICAL DATA: Lethargy.  Atrial fibrillation.

EXAM:
CHEST  2 VIEW

[chest lat]
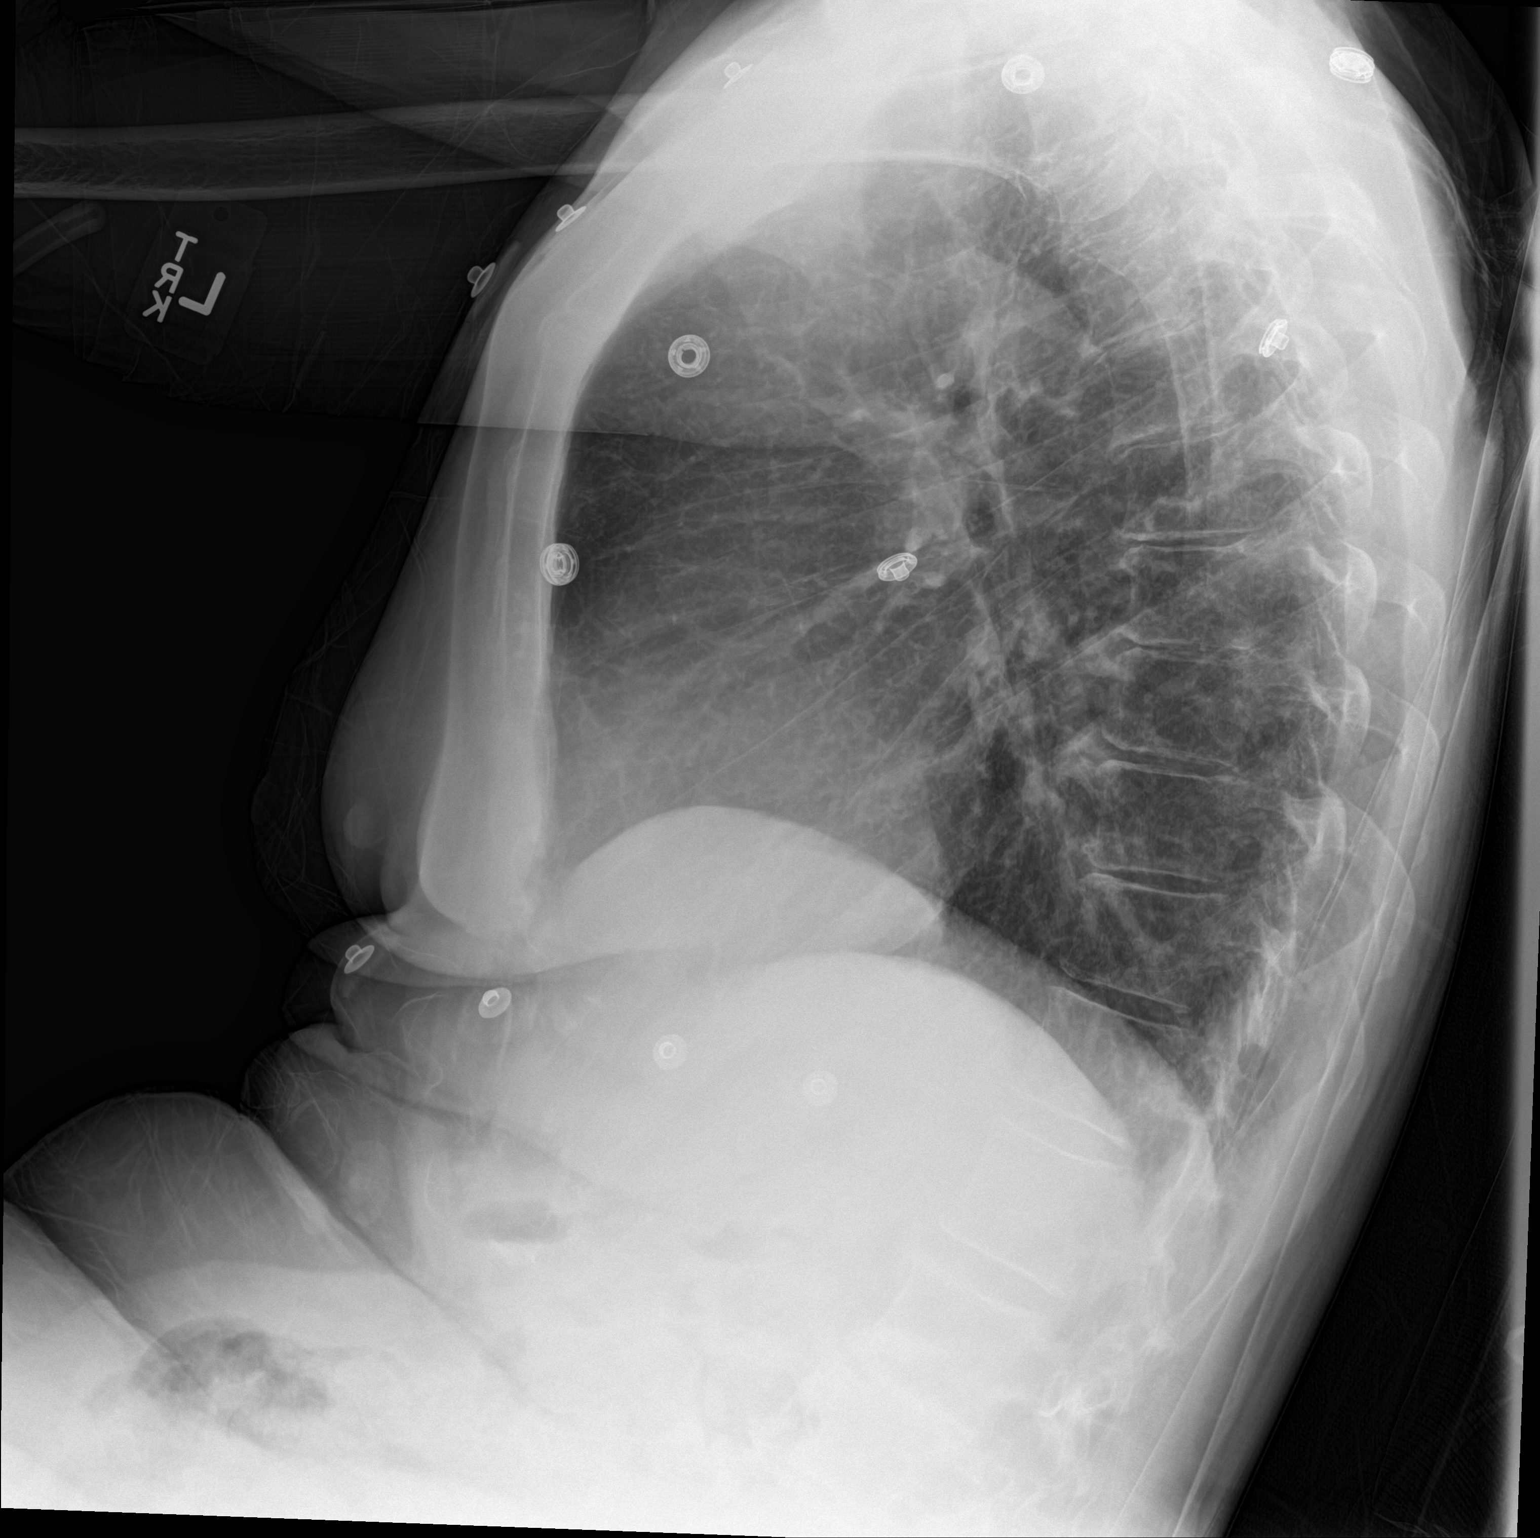

[chest ap]
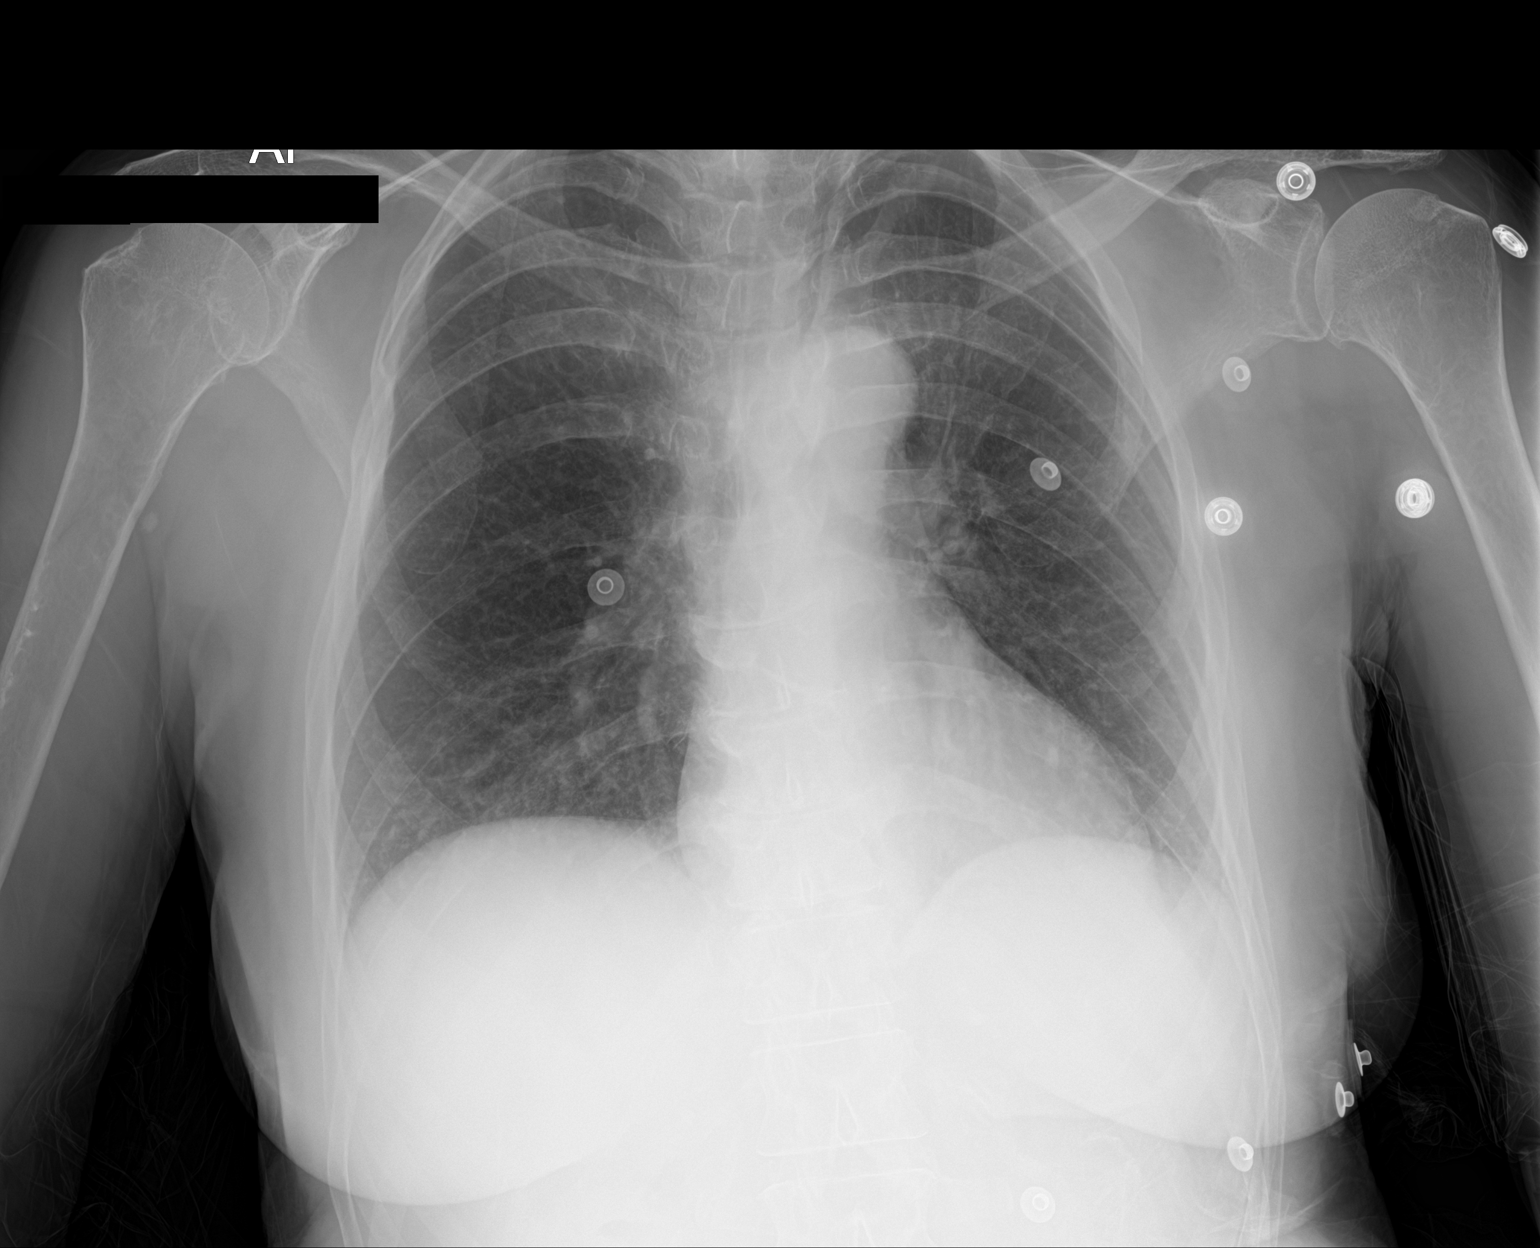

[2 of 2 positions shown; findings below may reference images not displayed]

FINDINGS: There is no edema or consolidation. The heart size and pulmonary
vascularity are normal. No adenopathy. There is mild degenerative
change in the thoracic spine. There is evidence of old healed rib
trauma on the right superiorly and laterally.
IMPRESSION: No edema or consolidation.

## 2016-02-02 ENCOUNTER — Telehealth: Payer: Self-pay | Admitting: Neurology

## 2016-02-02 NOTE — Telephone Encounter (Signed)
Spoke with patient's daughter. Release not on file so no personal information was given. She wanted to let me know that she is nervous about her mom driving. I offered to provide number to the driving evaluator. She states she has already had a driving evaluation. I said all she could do at this point is [facility]. No further advise given.

## 2016-02-02 NOTE — Telephone Encounter (Signed)
Lynwood DawleyDara PT's daughter called and would like a call back with a  question she has/Dawn CB# [phone]

## 2016-02-13 ENCOUNTER — Inpatient Hospital Stay: Admission: RE | Admit: 2016-02-13 | Payer: Medicare Other | Source: Ambulatory Visit

## 2016-02-13 ENCOUNTER — Other Ambulatory Visit: Payer: Medicare Other

## 2016-02-14 ENCOUNTER — Telehealth: Payer: Self-pay | Admitting: Neurology

## 2016-02-14 NOTE — Telephone Encounter (Signed)
[phone] patient phone number patient needs to talk to someone about what is going on with her

## 2016-02-14 NOTE — Telephone Encounter (Signed)
Patient called for confirmation that she just started a new anxiety medication and wanted to know if she should be driving. Made her aware with history of spells, and any time starting a new medication, should not drive to err on side of caution since she does not know how her body with react to new medication.  She wanted to confirm that and will hold on driving for now. Agreed with her that this was a good idea.

## 2016-02-14 NOTE — Telephone Encounter (Signed)
Agree, but she should also get the advise from the doctor prescribing her anxiety meds

## 2016-02-15 ENCOUNTER — Telehealth: Payer: Self-pay | Admitting: Neurology

## 2016-02-15 NOTE — Telephone Encounter (Signed)
Patient wanted to make we aware [facility] has not called her back yet. Made her aware I can't really do anything to help that as patients have to make their own behavioral health appts.

## 2016-02-15 NOTE — Telephone Encounter (Signed)
PT called and would like a called back in regards to a number for the [facility]/Dawn CB#[phone]

## 2016-02-20 ENCOUNTER — Telehealth: Payer: Self-pay | Admitting: Neurology

## 2016-02-20 ENCOUNTER — Telehealth (HOSPITAL_COMMUNITY): Payer: Self-pay

## 2016-02-20 NOTE — Telephone Encounter (Signed)
PT called and has a medication question/Dawn °CB# [phone] °

## 2016-02-20 NOTE — Telephone Encounter (Signed)
PT l/m to call about scheduling an appt. Attempted to call pt back and v/mail cut off in the middle of my message. Please ask pt to call [phone] if she calls back.

## 2016-02-20 NOTE — Telephone Encounter (Signed)
Spoke with patient and she states she seems to be doing better with anxiety on Vyybrid. She wants to know about changing Levodopa to extended release cause she is currently waking up every 3 hours to take medication. Made her aware since Psychiatrist just changed her anxiety medication Dr. Arbutus Leasat wouldn't want to change medications in case she can side effects then they would know which drug it came from. Let her know we would discuss medication changes at her follow up appt. She will call with any other questions.

## 2016-02-26 ENCOUNTER — Telehealth: Payer: Self-pay | Admitting: Neurology

## 2016-02-26 NOTE — Telephone Encounter (Signed)
PT called and has a medication question/Dawn CB# [phone]

## 2016-02-26 NOTE — Telephone Encounter (Signed)
Spoke with patient about CR vs IR Levodopa. Made her aware that Dr. Arbutus Leasat will talk to her about medications at her appt.
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
## 2016-03-05 ENCOUNTER — Ambulatory Visit (INDEPENDENT_AMBULATORY_CARE_PROVIDER_SITE_OTHER): Payer: Medicare Other | Admitting: Neurology

## 2016-03-05 ENCOUNTER — Encounter: Payer: Self-pay | Admitting: Neurology

## 2016-03-05 VITALS — BP 90/62 | HR 82 | Ht 65.0 in | Wt 141.0 lb

## 2016-03-05 DIAGNOSIS — G4752 REM sleep behavior disorder: Secondary | ICD-10-CM

## 2016-03-05 DIAGNOSIS — R292 Abnormal reflex: Secondary | ICD-10-CM | POA: Diagnosis not present

## 2016-03-05 DIAGNOSIS — F410 Panic disorder [episodic paroxysmal anxiety] without agoraphobia: Secondary | ICD-10-CM

## 2016-03-05 DIAGNOSIS — G20 Parkinson's disease: Secondary | ICD-10-CM | POA: Diagnosis not present

## 2016-03-06 ENCOUNTER — Telehealth: Payer: Self-pay | Admitting: Neurology

## 2016-03-06 NOTE — Telephone Encounter (Signed)
Patient made aware Dr. Arbutus Leasat received her driving evaluation that was done exactly a year ago. She states she does not have a problem with her driving but may recommend a repeat evaluation in the future.

## 2016-03-06 NOTE — Telephone Encounter (Signed)
PT called and would like a call back/Dawn CB# [phone]

## 2016-03-09 ENCOUNTER — Ambulatory Visit
Admission: RE | Admit: 2016-03-09 | Discharge: 2016-03-09 | Disposition: A | Discharge status: Home / Self Care | Payer: Medicare Other | Source: Ambulatory Visit | Attending: Neurology | Admitting: Neurology

## 2016-03-09 DIAGNOSIS — R292 Abnormal reflex: Secondary | ICD-10-CM

## 2016-03-09 DIAGNOSIS — M5136 Other intervertebral disc degeneration, lumbar region: Secondary | ICD-10-CM

## 2016-03-09 DIAGNOSIS — M542 Cervicalgia: Secondary | ICD-10-CM

## 2016-03-09 DIAGNOSIS — Z9889 Other specified postprocedural states: Secondary | ICD-10-CM

## 2016-03-09 MED ORDER — GADOBENATE DIMEGLUMINE 529 MG/ML IV SOLN
13.0000 mL | Freq: Once | INTRAVENOUS | Status: AC | PRN
  Administered 2016-03-09: 13 mL via INTRAVENOUS

## 2016-03-10 ENCOUNTER — Telehealth: Payer: Self-pay | Admitting: Neurology

## 2016-03-10 NOTE — Telephone Encounter (Signed)
Tell patient that I reviewed cervical and lumbar spine MRI's.  Both have areas with significant central canal stenosis due to disc bulging.  Needs neurosx consult.  If agreeable, send to Dr. Venetia MaxonStern

## 2016-03-11 NOTE — Telephone Encounter (Signed)
Left message on machine for patient to call back.

## 2016-03-12 NOTE — Telephone Encounter (Signed)
Patient made aware. Referral faxed to [facility] at [phone] with confirmation received. They will contact the patient to schedule.  

## 2016-03-12 NOTE — Telephone Encounter (Signed)
Patient wants to know the results of the MRI please call her at [phone]

## 2016-03-14 ENCOUNTER — Telehealth: Payer: Self-pay | Admitting: Neurology

## 2016-03-14 NOTE — Telephone Encounter (Signed)
Patient inquiring about neurosurgery referral. Made aware it may take a couple days to hear from them. Given their number to call them directly if she doesn't hear from them in a week.

## 2016-03-14 NOTE — Telephone Encounter (Signed)
PT left a message to have a call back at [phone]/Dawn

## 2016-03-15 ENCOUNTER — Ambulatory Visit: Payer: Medicare Other | Admitting: Neurology

## 2016-03-22 ENCOUNTER — Emergency Department (HOSPITAL_COMMUNITY)
Admission: EM | Admit: 2016-03-22 | Discharge: 2016-03-22 | Disposition: A | Discharge status: Home / Self Care | Payer: Medicare Other | Attending: Emergency Medicine | Admitting: Emergency Medicine

## 2016-03-22 ENCOUNTER — Emergency Department (HOSPITAL_COMMUNITY): Payer: Medicare Other

## 2016-03-22 ENCOUNTER — Encounter (HOSPITAL_COMMUNITY): Payer: Self-pay | Admitting: *Deleted

## 2016-03-22 ENCOUNTER — Other Ambulatory Visit: Payer: Self-pay | Admitting: Neurology

## 2016-03-22 DIAGNOSIS — Z7901 Long term (current) use of anticoagulants: Secondary | ICD-10-CM | POA: Insufficient documentation

## 2016-03-22 DIAGNOSIS — R200 Anesthesia of skin: Secondary | ICD-10-CM | POA: Insufficient documentation

## 2016-03-22 DIAGNOSIS — M6281 Muscle weakness (generalized): Secondary | ICD-10-CM | POA: Diagnosis present

## 2016-03-22 DIAGNOSIS — M79605 Pain in left leg: Secondary | ICD-10-CM

## 2016-03-22 DIAGNOSIS — R202 Paresthesia of skin: Secondary | ICD-10-CM | POA: Diagnosis not present

## 2016-03-22 DIAGNOSIS — J45909 Unspecified asthma, uncomplicated: Secondary | ICD-10-CM | POA: Diagnosis not present

## 2016-03-22 DIAGNOSIS — G20 Parkinson's disease: Secondary | ICD-10-CM | POA: Insufficient documentation

## 2016-03-22 DIAGNOSIS — M545 Low back pain: Secondary | ICD-10-CM | POA: Diagnosis not present

## 2016-03-22 LAB — CBC
HEMATOCRIT: 39.7 % (ref 36.0–46.0)
HEMOGLOBIN: 13 g/dL (ref 12.0–15.0)
MCH: 30.4 pg (ref 26.0–34.0)
MCHC: 32.7 g/dL (ref 30.0–36.0)
MCV: 93 fL (ref 78.0–100.0)
Platelets: 251 10*3/uL (ref 150–400)
RBC: 4.27 MIL/uL (ref 3.87–5.11)
RDW: 12.8 % (ref 11.5–15.5)
WBC: 6.3 10*3/uL (ref 4.0–10.5)

## 2016-03-22 LAB — BASIC METABOLIC PANEL
ANION GAP: 7 (ref 5–15)
BUN: 12 mg/dL (ref 6–20)
CALCIUM: 9.6 mg/dL (ref 8.9–10.3)
CHLORIDE: 103 mmol/L (ref 101–111)
CO2: 30 mmol/L (ref 22–32)
Creatinine, Ser: 0.67 mg/dL (ref 0.44–1.00)
GFR calc Af Amer: >60 mL/min (ref >60)
GFR calc non Af Amer: >60 mL/min (ref >60)
GLUCOSE: 90 mg/dL (ref 65–99)
Potassium: 3.7 mmol/L (ref 3.5–5.1)
Sodium: 140 mmol/L (ref 135–145)

## 2016-03-22 LAB — URINALYSIS, ROUTINE W REFLEX MICROSCOPIC
BILIRUBIN URINE: NEGATIVE
GLUCOSE, UA: NEGATIVE mg/dL
HGB URINE DIPSTICK: NEGATIVE
Ketones, ur: NEGATIVE mg/dL
Nitrite: NEGATIVE
PH: 6 (ref 5.0–8.0)
Protein, ur: NEGATIVE mg/dL

## 2016-03-22 LAB — URINE MICROSCOPIC-ADD ON

## 2016-03-22 MED ORDER — SODIUM CHLORIDE 0.9 % IV BOLUS (SEPSIS)
500.0000 mL | Freq: Once | INTRAVENOUS | Status: AC
  Administered 2016-03-22: 500 mL via INTRAVENOUS

## 2016-03-22 MED ORDER — APIXABAN 5 MG PO TABS
5.0000 mg | ORAL_TABLET | ORAL | Status: DC
  Filled 2016-03-22: qty 1

## 2016-03-22 MED ORDER — APIXABAN 5 MG PO TABS
5.0000 mg | ORAL_TABLET | Freq: Two times a day (BID) | ORAL | Status: DC
  Filled 2016-03-22: qty 1

## 2016-03-22 MED ORDER — ROPINIROLE HCL ER 6 MG PO TB24: 1.0000 | ORAL_TABLET | Freq: Two times a day (BID) | ORAL | Status: DC

## 2016-03-22 NOTE — ED Triage Notes (Signed)
Pt reports onset of right leg weakness last night. Was trying to do exercises to right leg throughout the night, this am was unable to move right leg. Pt also reports left arm weakness and feels like she is leaning towards the left and has mild right side headache. Grips are equal at triage. Pt has hx of parkinsons.

## 2016-03-22 NOTE — ED Notes (Signed)
Med requested  To dr plunkett   Pt went to mri before  The med came up

## 2016-03-22 NOTE — ED Provider Notes (Signed)
[facility]EMER[facility]ENCY DEPT Provider Note   CSN: [id number] Arrival date & time: 03/22/16  1006  History   Chief Complaint Chief Complaint  Patient presents with  . Extremity Weakness    HPI Sharon Jenkins is a 65 y.o. female.  HPI  65 y.o. female with a hx of Atrial Fibrillation, Parkinsons Disease, Anxiety, presents to the Emergency Department today complaining of left leg weakness this AM. Pt states that she woke up this morning and was unable to move her left leg. Notes that this lasted from 5:30AM to 7AM and spontaneously returned. Pt notes back pain 5/10 on pain scale, but this appears chronic. Notes saddle anesthesia, but patient states that she has had this for a long time. No loss of bowel or bladder function. Has extensive history with numerous neurosurgery consults in past at [facility] for possible surgery, but pt delayed treatment. Pt was told that eventually she would need surgery. No CP/SOB/ABD pain. No N/V. No fevers. Seen by Neurology (Dr. Arbutus Leasat) on 03-05-16 for her Parkinsons as well as left arm weakness and leg numbness. MR Lumbar obtained on 03-09-16 (see below). Given referral to Neurosurgery (Dr. Venetia MaxonStern) due to central canal stenosis due to disc bulging.  The patient presented to [facility] on 01/14/2016 with numbness and weakness of the left with unremarkable MR brain (see below)   MRA Head/Brain 01-14-16   IMPRESSION: Normal intracranial MRA.  MR Lumbar 03-09-16  IMPRESSION: Advanced diffuse lumbar disc degeneration, worst at L3-4 where there is severe spinal stenosis.   Past Medical History:  Diagnosis Date  . Anxiety   . Asthma   . Atrial fibrillation (HCC)   . Chronic bronchitis (HCC)    "I'm good for a case a year" (05/02/2015)  . Depression   . DVT (deep venous thrombosis) (HCC)    "don't remember which side"  . Parkinson disease (HCC) dx'd ~ 2009  . Sleep apnea    "I chose not to wear a mask" (05/02/2015)  . Spinal stenosis of lumbar region   . Walking pneumonia X 1     Patient Active Problem List   Diagnosis Date Noted  . Left sided numbness 01/14/2016  . Hypotension, unspecified hypotension type 11/15/2015  . Anxiety and depression 07/04/2015  . Anticoagulated 07/04/2015  . Weakness 05/02/2015  . Parkinson disease (HCC) 05/02/2015  . Memory deficit 05/02/2015  . PAF (paroxysmal atrial fibrillation) (HCC) 05/02/2015    Past Surgical History:  Procedure Laterality Date  . ABDOMINAL HYSTERECTOMY  ~ 2006  . BACK SURGERY    . CATARACT EXTRACTION W/ INTRAOCULAR LENS  IMPLANT, BILATERAL Bilateral 1998  . DILATION AND CURETTAGE OF UTERUS  1974  . GENIOPLASTY  ~ [id number]   "placed screw and bone matter in my chin & brought it forward"  . KNEE ARTHROSCOPY Left    "meniscus tear"  . LUMBAR DISC ARTHROPLASTY  1991  . TUBAL LIGATION  05/1978  . VARICOSE VEIN SURGERY Right 1987    OB History    No data available       Home Medications    Prior to Admission medications   Medication Sig Start Date End Date Taking? Authorizing Provider  acetaminophen (TYLENOL) 325 MG tablet Take 650 mg by mouth every 6 (six) hours as needed for mild pain.   Yes Historical Provider, MD  Artificial Tear Ointment (DRY EYES OP) Apply to eye 2 (two) times daily.   Yes Historical Provider, MD  Calcium Carbonate-Vit D-Min (CALCIUM 600+D3 PLUS MINERALS PO) Take 1 tablet  by mouth daily.   Yes Historical Provider, MD  carbidopa-levodopa (SINEMET IR) 25-100 MG per tablet Take 1.5 tablets by mouth. 7-8 times daily   Yes Historical Provider, MD  clonazePAM (KLONOPIN) 0.5 MG tablet Take 0.25-0.5 mg by mouth 2 (two) times daily. Take 1/2 tablet by moyth at noon and take 1 tablet by mouth at bedtime   Yes Historical Provider, MD  ELIQUIS 5 MG TABS tablet TAKE 1 TABLET BY MOUTH 2 TIMES DAILY 11/20/15  Yes Newman Nip, NP  Multiple Vitamin (MULTIVITAMIN WITH MINERALS) TABS tablet Take 1 tablet by mouth daily.   Yes Historical Provider, MD  Ropinirole HCl 6 MG TB24 TAKE 1 TABLET  BY MOUTH 2 TIMES DAILY 03/22/16  Yes Rebecca S Tat, DO  Vilazodone HCl (VIIBRYD) 10 MG TABS TAKE 1 TABLET BY MOUTH ONCE DAILY 01/16/16  Yes Historical Provider, MD    Family History Family History  Problem Relation Age of Onset  . Heart attack Mother   . Hypertension Father   . Stroke Father   . Stroke Sister   . Epilepsy Daughter     Social History Social History  Substance Use Topics  . Smoking status: Never Smoker  . Smokeless tobacco: Never Used  . Alcohol use No     Allergies   Lidocaine   Review of Systems Review of Systems ROS reviewed and all are negative for acute change except as noted in the HPI.  Physical Exam Updated Vital Signs BP 118/66 (BP Location: Left Arm)   Pulse 76   Temp 97.5 F (36.4 C) (Oral)   Resp 17   SpO2 99%   Physical Exam  Constitutional: She is oriented to person, place, and time. Vital signs are normal. She appears well-developed and well-nourished.  HENT:  Head: Normocephalic.  Right Ear: Hearing normal.  Left Ear: Hearing normal.  Eyes: Conjunctivae and EOM are normal. Pupils are equal, round, and reactive to light.  Neck: Normal range of motion. Neck supple.  Cardiovascular: Normal rate, regular rhythm, normal heart sounds and intact distal pulses.  Exam reveals no gallop and no friction rub.   No murmur heard. Pulmonary/Chest: Effort normal and breath sounds normal. No respiratory distress. She has no wheezes. She has no rales. She exhibits no tenderness.  Abdominal: Soft. Bowel sounds are normal.  Musculoskeletal: Normal range of motion.  Neurological: She is alert and oriented to person, place, and time. She has normal strength. A sensory deficit is present.  Noted sensory deficits on BLE (chronic?). Motor intact bilaterally. Reflexes intact   Skin: Skin is warm and dry.  Psychiatric: She has a normal mood and affect. Her speech is normal and behavior is normal. Thought content normal.  Nursing note and vitals  reviewed.  ED Treatments / Results  Labs (all labs ordered are listed, but only abnormal results are displayed) Labs Reviewed  URINALYSIS, ROUTINE W REFLEX MICROSCOPIC (NOT AT [facility]) - Abnormal; Notable for the following:       Result Value   Specific Gravity, Urine <1.005 (*)    Leukocytes, UA TRACE (*)    All other components within normal limits  URINE MICROSCOPIC-ADD ON - Abnormal; Notable for the following:    Squamous Epithelial / LPF 0-5 (*)    Bacteria, UA RARE (*)    All other components within normal limits  BASIC METABOLIC PANEL  CBC   EKG  EKG Interpretation None      Radiology No results found.  Procedures Procedures (including critical care  time)  Medications Ordered in ED Medications - No data to display   Initial Impression / Assessment and Plan / ED Course  I have reviewed the triage vital signs and the nursing notes.  Pertinent labs & imaging results that were available during my care of the patient were reviewed by me and considered in my medical decision making (see chart for details).  Clinical Course   Final Clinical Impressions(s) / ED Diagnoses  I have reviewed and evaluated the relevant laboratory values I have reviewed and evaluated the relevant imaging studies.  I have reviewed the relevant previous healthcare records.I obtained HPI from historian. Patient discussed with supervising physician  ED Course:  Assessment: Pt is a 65yF with hx of Atrial Fibrillation on Eliquis, Parkinsons Disease, Anxiety who presents with let lower leg paralysis that she noted this morning. Notes occasional resolution of symptoms. Difficulty with ambulation, but patient has had in past. Seen by Neurology for PD and given referral to Neurosurgery (Dr. Venetia Maxon) who has not yet evaluated patient. Scheduled follow up. Has hx of back surgery in past. Seen by multiple neurosurgeons and told that she would eventually become paralyzed due to severe stenosis in lumbar region.  Recent MRs showed severe stenosis (03-09-16) without intrusion. On exam, pt in NAD. Nontoxic/nonseptic appearing. VSS. Afebrile. Lungs CTA. Heart RRR. LLE otherwise unremarkable on exam. Pt does have saddle anesthesia, but this appears chronic for her for several months. Noted sensory deficits bilaterally. Discussed with supervising physician. Due to new onest symptoms of inability to move left leg, will obtain MRI Lumbar spine. Labs unremarkable.   Pending MR, If Positive. Call Neurosurgery (Dr. Venetia Maxon)  Disposition/Plan:  4:49 PM- Sign out to Santiago Glad, PA-C If unremarkable imaging. Send home with follow up to Neurosurgeon as outpatient  Supervising Physician Lyndal Pulley, MD   Final diagnoses:  None    New Prescriptions New Prescriptions   No medications on file     Audry Pili, PA-C 03/22/16 1650    Lyndal Pulley, MD 03/23/16 1036

## 2016-03-22 NOTE — ED Notes (Signed)
Pt still in mri 

## 2016-03-22 NOTE — Discharge Instructions (Signed)
It is recommended that you abstain from driving.  It can be very dangerous for you to drive when you are having the numbness and weakness of your legs.

## 2016-03-22 NOTE — ED Notes (Signed)
Pt has return from MR and has been placed back on monitor.

## 2016-03-22 NOTE — ED Notes (Signed)
In mri 

## 2016-03-22 NOTE — ED Provider Notes (Signed)
5:00 PM Patient signed out to me at shift change by Audry Piliyler Mohr, PA-C.  Patient with a history of Parkinson's Disease and chronic back pain presents today with weakness of her left leg.  She has baseline numbness of both extremities.  She has been seen by Neurology and was referred to Dr Venetia MaxonStern with Neurosurgery.  However, she has not yet been seen by him.  Plan is for the patient to get a MRI and discharge if negative for emergent findings.   7:15 PM  MRI results as follows: IMPRESSION: Stable degenerative changes of the lumbar spine greatest at the L4-5 level where there is severe canal stenosis. No acute osseous abnormality is identified.  Discussed MRI results with the patient and daughter.  Patient given referral to Neurosurgery outpatient.  Stable for discharge.  Return precautions given.      Santiago GladHeather Tamora Huneke, PA-C 03/22/16 2053    Gwyneth SproutWhitney Plunkett, MD 03/22/16 [phone]

## 2016-03-22 NOTE — ED Notes (Signed)
Pt took her sinemet per tyler, PA.

## 2016-03-27 ENCOUNTER — Telehealth: Payer: Self-pay | Admitting: Cardiovascular Disease

## 2016-03-27 NOTE — Telephone Encounter (Signed)
New Message  Pt c/o BP issue: STAT if pt c/o blurred vision, one-sided weakness or slurred speech  1. What are your last 5 BP readings? All readings were from today 80/61 83/57 89/71  77/43 84/52 111/76-Just now  2. Are you having any other symptoms (ex. Dizziness, headache, blurred vision, passed out)? Weakness all over, blurred vision, dizziness, headaches,   3. What is your BP issue? Pt voiced she just doesn't feel right.

## 2016-03-27 NOTE — Telephone Encounter (Signed)
Received direct call transferred from operator and spoke with pt.  I confirmed blood pressure readings as listed below with pt. She states heart rate is 93-93. She reports headache since 3 AM.  She also complains of blurred vision and generalized weakness.  Speech seems clear but a little slow.  She reports she has Parkinson's.  She reports problems with anxiety.  Pt is asking if she could come into afib clinic for evaluation today.  I told pt she needed to go to ED for evaluation of these symptoms.  She does not want to call EMS and will have someone transport her.

## 2016-03-27 NOTE — Telephone Encounter (Signed)
Agree. Thanks

## 2016-04-10 ENCOUNTER — Telehealth: Payer: Self-pay | Admitting: Neurology

## 2016-04-10 NOTE — Telephone Encounter (Signed)
Spoke with patient and she described same wearing down feeling she described at her appt at 2 pm daily. She has not tried taking 1/2 tab of levodopa. She will try that and let us know if it helps.   She also asks again about Viibrid and make her aware Dr. Arbutus Leasat does not manage this medication.

## 2016-04-10 NOTE — Telephone Encounter (Signed)
Left message on machine for patient to call back.

## 2016-04-10 NOTE — Telephone Encounter (Signed)
Patient states that she needs to talk to someone about medication she states that after 2 hours of taking her medication she falls apart. She can hardly walk etc. Please call [phone]

## 2016-04-21 ENCOUNTER — Emergency Department (HOSPITAL_COMMUNITY): Payer: Medicare Other

## 2016-04-21 ENCOUNTER — Encounter (HOSPITAL_COMMUNITY): Payer: Self-pay | Admitting: Emergency Medicine

## 2016-04-21 ENCOUNTER — Observation Stay (HOSPITAL_COMMUNITY)
Admission: EM | Admit: 2016-04-21 | Discharge: 2016-04-21 | Disposition: A | Discharge status: Home / Self Care | Payer: Medicare Other | Attending: Emergency Medicine | Admitting: Emergency Medicine

## 2016-04-21 DIAGNOSIS — G20 Parkinson's disease: Secondary | ICD-10-CM | POA: Insufficient documentation

## 2016-04-21 DIAGNOSIS — Z86718 Personal history of other venous thrombosis and embolism: Secondary | ICD-10-CM | POA: Diagnosis not present

## 2016-04-21 DIAGNOSIS — R079 Chest pain, unspecified: Principal | ICD-10-CM | POA: Insufficient documentation

## 2016-04-21 DIAGNOSIS — I480 Paroxysmal atrial fibrillation: Secondary | ICD-10-CM | POA: Insufficient documentation

## 2016-04-21 DIAGNOSIS — N390 Urinary tract infection, site not specified: Secondary | ICD-10-CM | POA: Diagnosis present

## 2016-04-21 DIAGNOSIS — Z7901 Long term (current) use of anticoagulants: Secondary | ICD-10-CM | POA: Insufficient documentation

## 2016-04-21 LAB — COMPREHENSIVE METABOLIC PANEL
ALBUMIN: 3.4 g/dL — AB (ref 3.5–5.0)
ALK PHOS: 57 U/L (ref 38–126)
ALT: <5 U/L — ABNORMAL LOW (ref 14–54)
ANION GAP: 7 (ref 5–15)
AST: 22 U/L (ref 15–41)
BILIRUBIN TOTAL: 0.6 mg/dL (ref 0.3–1.2)
BUN: 12 mg/dL (ref 6–20)
CALCIUM: 9.4 mg/dL (ref 8.9–10.3)
CO2: 30 mmol/L (ref 22–32)
Chloride: 103 mmol/L (ref 101–111)
Creatinine, Ser: 0.83 mg/dL (ref 0.44–1.00)
GFR calc Af Amer: >60 mL/min (ref >60)
GFR calc non Af Amer: >60 mL/min (ref >60)
GLUCOSE: 97 mg/dL (ref 65–99)
POTASSIUM: 3.7 mmol/L (ref 3.5–5.1)
SODIUM: 140 mmol/L (ref 135–145)
TOTAL PROTEIN: 6.2 g/dL — AB (ref 6.5–8.1)

## 2016-04-21 LAB — CBC WITH DIFFERENTIAL/PLATELET
BASOS ABS: 0 10*3/uL (ref 0.0–0.1)
BASOS PCT: 0 %
EOS ABS: 0.3 10*3/uL (ref 0.0–0.7)
Eosinophils Relative: 4 %
HEMATOCRIT: 37.2 % (ref 36.0–46.0)
HEMOGLOBIN: 12.4 g/dL (ref 12.0–15.0)
Lymphocytes Relative: 43 %
Lymphs Abs: 3.1 10*3/uL (ref 0.7–4.0)
MCH: 30.8 pg (ref 26.0–34.0)
MCHC: 33.3 g/dL (ref 30.0–36.0)
MCV: 92.3 fL (ref 78.0–100.0)
Monocytes Absolute: 0.6 10*3/uL (ref 0.1–1.0)
Monocytes Relative: 9 %
NEUTROS ABS: 3.1 10*3/uL (ref 1.7–7.7)
NEUTROS PCT: 44 %
Platelets: 228 10*3/uL (ref 150–400)
RBC: 4.03 MIL/uL (ref 3.87–5.11)
RDW: 12.8 % (ref 11.5–15.5)
WBC: 7.1 10*3/uL (ref 4.0–10.5)

## 2016-04-21 LAB — URINALYSIS, ROUTINE W REFLEX MICROSCOPIC
Bilirubin Urine: NEGATIVE
GLUCOSE, UA: NEGATIVE mg/dL
HGB URINE DIPSTICK: NEGATIVE
Ketones, ur: NEGATIVE mg/dL
Nitrite: NEGATIVE
PH: 7 (ref 5.0–8.0)
Protein, ur: NEGATIVE mg/dL
Specific Gravity, Urine: 1.01 (ref 1.005–1.030)

## 2016-04-21 LAB — I-STAT TROPONIN, ED
TROPONIN I, POC: 0 ng/mL (ref 0.00–0.08)
Troponin i, poc: 0 ng/mL (ref 0.00–0.08)

## 2016-04-21 LAB — URINE MICROSCOPIC-ADD ON: RBC / HPF: NONE SEEN RBC/hpf (ref 0–5)

## 2016-04-21 MED ORDER — DEXTROSE 5 % IV SOLN
1.0000 g | Freq: Once | INTRAVENOUS | Status: AC
  Administered 2016-04-21: 1 g via INTRAVENOUS
  Filled 2016-04-21: qty 10

## 2016-04-21 MED ORDER — CEPHALEXIN 500 MG PO CAPS: 1000.0000 mg | ORAL_CAPSULE | Freq: Two times a day (BID) | ORAL | 0 refills | Status: DC

## 2016-04-21 NOTE — ED Triage Notes (Signed)
Pt presents to the ed with complaints of chest pain that has been intermittent for 2 weeks and constant 2 hours, she described it as a pressure that went into her neck with nausea, she has also had burning with urination for 2 days.

## 2016-04-21 NOTE — ED Provider Notes (Addendum)
[facility] DEPT Provider Note   CSN: [id number] Arrival date & time: 04/21/16  [id number]   By signing my name below, I, Valentino SaxonBianca Contreras, attest that this documentation has been prepared under the direction and in the presence of Gilda Creasehristopher J Aliyanah Rozas, MD. Electronically Signed: Valentino SaxonBianca Contreras, ED Scribe. 04/21/16. 1:15 AM.  History   Chief Complaint Chief Complaint  Patient presents with  . Chest Pain   The history is provided by the patient. No language interpreter was used.     HPI Comments: Raechell Cyndie ChimeH Hornaday is a 65 y.o. female hx of Parkinson's, who presents to the Emergency Department complaining of moderate, intermittent, chest pain onset two weeks ago. Pt reports her CP gradually worsened 2 hours ago. She reports associated chest tightness and tachycardia with a burning sensation. She notes being seen by her cardiologist for similar problems. Per pt, reports being treated for A-fib. Pt notes taking 25mg  of Tylenol every night with no significant relief. She also notes losing control of her body with both of her arms feeling heavy. She does note that her right arm feels more heavier compared to her left arm. She also reports having generalized weakness throughout her body. Pt notes being unable to get out of bed, but usually ambulates with the assistance of a walker. She also reports having trouble urinating with associated frequency and dysuria. Pt reports no modifying factors noted. She denies SOB and hematuria. Per pt, she currently takes Eliquis. Pt currently lives at home alone. No additional complaints at this time.    Past Medical History:  Diagnosis Date  . Anxiety   . Asthma   . Atrial fibrillation (HCC)   . Chronic bronchitis (HCC)    "I'm good for a case a year" (05/02/2015)  . Depression   . DVT (deep venous thrombosis) (HCC)    "don't remember which side"  . Parkinson disease (HCC) dx'd ~ 2009  . Sleep apnea    "I chose not to wear a mask" (05/02/2015)  . Spinal  stenosis of lumbar region   . Walking pneumonia X 1    Patient Active Problem List   Diagnosis Date Noted  . Left sided numbness 01/14/2016  . Hypotension, unspecified hypotension type 11/15/2015  . Anxiety and depression 07/04/2015  . Anticoagulated 07/04/2015  . Weakness 05/02/2015  . Parkinson disease (HCC) 05/02/2015  . Memory deficit 05/02/2015  . PAF (paroxysmal atrial fibrillation) (HCC) 05/02/2015    Past Surgical History:  Procedure Laterality Date  . ABDOMINAL HYSTERECTOMY  ~ 2006  . BACK SURGERY    . CATARACT EXTRACTION W/ INTRAOCULAR LENS  IMPLANT, BILATERAL Bilateral 1998  . DILATION AND CURETTAGE OF UTERUS  1974  . GENIOPLASTY  ~ [id number]   "placed screw and bone matter in my chin & brought it forward"  . KNEE ARTHROSCOPY Left    "meniscus tear"  . LUMBAR DISC ARTHROPLASTY  1991  . TUBAL LIGATION  05/1978  . VARICOSE VEIN SURGERY Right 1987    OB History    No data available       Home Medications    Prior to Admission medications   Medication Sig Start Date End Date Taking? Authorizing Provider  acetaminophen (TYLENOL) 325 MG tablet Take 650 mg by mouth every 6 (six) hours as needed for mild pain.   Yes Historical Provider, MD  Artificial Tear Ointment (DRY EYES OP) Apply to eye 2 (two) times daily.   Yes Historical Provider, MD  Calcium Carbonate-Vit D-Min (CALCIUM 600+D3 PLUS MINERALS  PO) Take 1 tablet by mouth daily.   Yes Historical Provider, MD  carbidopa-levodopa (SINEMET IR) 25-100 MG per tablet Take 1.5 tablets by mouth. 7-8 times daily   Yes Historical Provider, MD  clonazePAM (KLONOPIN) 0.5 MG tablet Take 0.25-0.5 mg by mouth 2 (two) times daily. Take 1/2 tablet by moyth at noon and take 1 tablet by mouth at bedtime   Yes Historical Provider, MD  ELIQUIS 5 MG TABS tablet TAKE 1 TABLET BY MOUTH 2 TIMES DAILY 11/20/15  Yes Newman Nip, NP  Multiple Vitamin (MULTIVITAMIN WITH MINERALS) TABS tablet Take 1 tablet by mouth daily.   Yes Historical  Provider, MD  Ropinirole HCl 6 MG TB24 TAKE 1 TABLET BY MOUTH 2 TIMES DAILY 03/22/16  Yes Rebecca S Tat, DO  Vilazodone HCl (VIIBRYD) 10 MG TABS TAKE 1 TABLET BY MOUTH ONCE DAILY 01/16/16  Yes Historical Provider, MD    Family History Family History  Problem Relation Age of Onset  . Heart attack Mother   . Hypertension Father   . Stroke Father   . Stroke Sister   . Epilepsy Daughter     Social History Social History  Substance Use Topics  . Smoking status: Never Smoker  . Smokeless tobacco: Never Used  . Alcohol use No     Allergies   Lidocaine   Review of Systems Review of Systems  Respiratory: Positive for chest tightness. Negative for shortness of breath.        + tachycardia  Cardiovascular: Positive for chest pain.  Genitourinary: Positive for dysuria and frequency. Negative for hematuria.  Neurological: Positive for weakness.  All other systems reviewed and are negative.    Physical Exam Updated Vital Signs BP 130/79   Pulse 77   Temp 97.7 F (36.5 C) (Oral)   Resp 23   Ht 5\' 8"  (1.727 m)   Wt 141 lb (64 kg)   SpO2 99%   BMI 21.44 kg/m   Physical Exam  Constitutional: She is oriented to person, place, and time. She appears well-developed and well-nourished. No distress.  HENT:  Head: Normocephalic and atraumatic.  Right Ear: Hearing normal.  Left Ear: Hearing normal.  Nose: Nose normal.  Mouth/Throat: Oropharynx is clear and moist and mucous membranes are normal.  Eyes: Conjunctivae and EOM are normal. Pupils are equal, round, and reactive to light.  Neck: Normal range of motion. Neck supple.  Cardiovascular: Regular rhythm, S1 normal and S2 normal.  Exam reveals no gallop and no friction rub.   No murmur heard. Pulmonary/Chest: Effort normal and breath sounds normal. No respiratory distress. She exhibits no tenderness.  Abdominal: Soft. Normal appearance and bowel sounds are normal. There is no hepatosplenomegaly. There is no tenderness. There is  no rebound, no guarding, no tenderness at McBurney's point and negative Murphy's sign. No hernia.  Musculoskeletal: Normal range of motion.  Strength all four extremities, 3/5 symmetric.   Neurological: She is alert and oriented to person, place, and time. She has normal strength. No cranial nerve deficit or sensory deficit. Coordination normal. GCS eye subscore is 4. GCS verbal subscore is 5. GCS motor subscore is 6.  Skin: Skin is warm, dry and intact. No rash noted. No cyanosis.  Psychiatric: She has a normal mood and affect. Her speech is normal and behavior is normal. Thought content normal.  Nursing note and vitals reviewed.    ED Treatments / Results   DIAGNOSTIC STUDIES: Oxygen Saturation is 99% on RA, normal by my interpretation.  COORDINATION OF CARE: 1:00 AM Discussed treatment plan with pt at bedside which includes EKG, Head Ct, Chest XR, labs and pt agreed to plan.  Labs (all labs ordered are listed, but only abnormal results are displayed) Labs Reviewed  COMPREHENSIVE METABOLIC PANEL - Abnormal; Notable for the following:       Result Value   Total Protein 6.2 (*)    Albumin 3.4 (*)    ALT <5 (*)    All other components within normal limits  URINALYSIS, ROUTINE W REFLEX MICROSCOPIC (NOT AT [facility]) - Abnormal; Notable for the following:    APPearance CLOUDY (*)    Leukocytes, UA MODERATE (*)    All other components within normal limits  URINE MICROSCOPIC-ADD ON - Abnormal; Notable for the following:    Squamous Epithelial / LPF 0-5 (*)    Bacteria, UA RARE (*)    All other components within normal limits  URINE CULTURE  CBC WITH DIFFERENTIAL/PLATELET  I-STAT TROPOININ, ED  I-STAT TROPOININ, ED    EKG  EKG Interpretation  Date/Time:  Sunday April 21 2016 00:50:59 EDT Ventricular Rate:  69 PR Interval:    QRS Duration: 99 QT Interval:  430 QTC Calculation: 461 R Axis:   1 Text Interpretation:  Sinus rhythm Probable anteroseptal infarct, old No  significant change since last tracing Confirmed by Hazel Leveille  MD, Aastha Dayley [phone]([id number]) on 04/21/2016 1:08:51 AM       Radiology Ct Head Wo Contrast  Result Date: 04/21/2016 CLINICAL DATA:  Weakness.  Right arm abnormal sensations. EXAM: CT HEAD WITHOUT CONTRAST TECHNIQUE: Contiguous axial images were obtained from the base of the skull through the vertex without intravenous contrast. COMPARISON:  Brain MRI 01/14/2016 FINDINGS: Brain: No mass lesion, intraparenchymal hemorrhage or extra-axial collection. No evidence of acute cortical infarct. Brain parenchyma and CSF-containing spaces are normal for age. Vascular: No hyperdense vessel or unexpected calcification. Skull: Normal visualized skull base, calvarium and extracranial soft tissues. Sinuses/Orbits: No sinus fluid levels or advanced mucosal thickening. No mastoid effusion. Normal orbits. IMPRESSION: Normal CT of the brain. Electronically Signed   By: Deatra RobinsonKevin  Herman M.D.   On: 04/21/2016 01:48   Dg Chest Port 1 View  Result Date: 04/21/2016 CLINICAL DATA:  Dyspnea and chest pain tonight EXAM: PORTABLE CHEST 1 VIEW COMPARISON:  Seven hundred thirty-seven FINDINGS: Basilar markings are mildly accentuated by a shallow degree of inspiration. No airspace consolidation. No large effusions. Hilar and mediastinal contours are unremarkable and unchanged. IMPRESSION: No active disease. Electronically Signed   By: Ellery Plunkaniel R Mitchell M.D.   On: 04/21/2016 01:05    Procedures Procedures (including critical care time)  Medications Ordered in ED Medications  cefTRIAXone (ROCEPHIN) 1 g in dextrose 5 % 50 mL IVPB (1 g Intravenous New Bag/Given 04/21/16 0556)     Initial Impression / Assessment and Plan / ED Course  I have reviewed the triage vital signs and the nursing notes.  Pertinent labs & imaging results that were available during my care of the patient were reviewed by me and considered in my medical decision making (see chart for details).  Clinical  Course    Patient presented with multiple concerns. Patient reports that she was experiencing intermittent episodes of tightness in her chest. Pain was present at arrival but did spontaneously resolve. Her initial EKG and troponin were unremarkable. Patient had a second troponin performed after several hours and it remains negative, chest pain not felt to be cardiac in origin.  Patient feeling very weak. She has a  history of Parkinson's. She has generalized weakness without focality. She was concerned about possible stroke. No focal findings to suggest stroke. CT head unremarkable.  Patient complaining of urinary frequency and dysuria. She does have evidence of urinary tract infection. Patient likely experiencing some generalized weakness secondary to urinary tract infection, exacerbating her Parkinson's disease. She was given Rocephin. Culture pending. She continues to exhibit severe weakness, normally is ambulatory with a walker. She lives in a fairly independent assisted living arrangement, will not be able to go back at this time. Will require hospitalization for further management.  Addendum: Patient seen briefly by hospitalist service. At time of evaluation, patient reported that her strength is much improved now and she does not want to be admitted to the hospital. The only reason I was considering admission was because of her weakness, she feels like she is back to her normal baseline and therefore is appropriate for discharge with continued antibiotic coverage.  Final Clinical Impressions(s) / ED Diagnoses   Final diagnoses:  Nonspecific chest pain  Urinary tract infection without hematuria, site unspecified    New Prescriptions New Prescriptions   No medications on file    I personally performed the services described in this documentation, which was scribed in my presence. The recorded information has been reviewed and is accurate.     Gilda Crease, MD 04/21/16 1610     Gilda Crease, MD 04/21/16 9604    Gilda Crease, MD 04/21/16 [phone]

## 2016-04-21 NOTE — ED Notes (Signed)
Pt voiced concerns about facility talking about one nurse that she saw "drinking while working". She also stated the same nurse told her that she "needed to pray because it was her time to go". I informed Marquita PalmsMario and Dr. Blinda LeatherwoodPollina about the pt's concerns.

## 2016-04-21 NOTE — ED Notes (Signed)
Notified [facility] for transportation back home 

## 2016-04-21 NOTE — ED Notes (Signed)
Pt asks that we call her son, Orvilla Fusommy, "so he can get on the road because he lives 3hrs away". Contacted son, explaining what her initial complaint was and what is currently being done. Orvilla Fusommy stated that he would call her back at 8am to check in on her, but he would be unable to come to the hospital today.   Tommy's phone # is [phone]

## 2016-04-21 NOTE — ED Notes (Signed)
Called [facility] facility per Pollina's request, in order to determine what level of care the facility provides. Per facility representative, pt is relatively independent with "safety checks" during 1st & 2nd shifts, but only a call bell overnight during 3rd shift. CNAs only at facility.   Relayed this information to Pollina, who feels pt inappropriate for d/c to that facility as she needs maximal assist for any care or movement.

## 2016-04-22 LAB — URINE CULTURE: Culture: 40000 — AB

## 2016-04-23 ENCOUNTER — Ambulatory Visit (INDEPENDENT_AMBULATORY_CARE_PROVIDER_SITE_OTHER): Payer: Medicare Other | Admitting: Licensed Clinical Social Worker

## 2016-04-23 DIAGNOSIS — F324 Major depressive disorder, single episode, in partial remission: Secondary | ICD-10-CM | POA: Diagnosis not present

## 2016-04-25 ENCOUNTER — Telehealth: Payer: Self-pay | Admitting: Neurology

## 2016-04-25 NOTE — Telephone Encounter (Signed)
Patient calls again to discuss anxiety.  Again make her aware we don't treat this. She again asked for names of local psychiatrists and will send this to her in the mail.   She also states PD symptoms are worse, but diagnosed with UTI this weekend. Made her aware Dr. Arbutus Leasat would not change PD meds while having an active UTI because of it's tendency to worsen PD symptoms.

## 2016-04-25 NOTE — Telephone Encounter (Signed)
Sharon Jenkins 2050/12/30. She was needing to speak with you regarding her medication. Her # is [phone]. Thank you

## 2016-04-30 ENCOUNTER — Ambulatory Visit: Payer: Medicare Other | Admitting: Neurology

## 2016-05-07 ENCOUNTER — Telehealth (HOSPITAL_COMMUNITY): Payer: Self-pay | Admitting: *Deleted

## 2016-05-07 NOTE — Telephone Encounter (Signed)
Left message on voicemail in reference to upcoming appointment scheduled for 05/14/16. Phone number given for a call back so details instructions can be given. Stormey Wilborn W   

## 2016-05-14 ENCOUNTER — Encounter (HOSPITAL_COMMUNITY): Payer: Self-pay

## 2016-05-14 ENCOUNTER — Emergency Department (HOSPITAL_COMMUNITY)
Admission: EM | Admit: 2016-05-14 | Discharge: 2016-05-14 | Disposition: A | Discharge status: Home / Self Care | Payer: Medicare Other | Attending: Emergency Medicine | Admitting: Emergency Medicine

## 2016-05-14 ENCOUNTER — Encounter (HOSPITAL_COMMUNITY): Payer: Self-pay | Admitting: *Deleted

## 2016-05-14 ENCOUNTER — Ambulatory Visit (HOSPITAL_COMMUNITY): Payer: Self-pay | Admitting: Nurse Practitioner

## 2016-05-14 DIAGNOSIS — F419 Anxiety disorder, unspecified: Secondary | ICD-10-CM | POA: Diagnosis not present

## 2016-05-14 DIAGNOSIS — Z7901 Long term (current) use of anticoagulants: Secondary | ICD-10-CM | POA: Insufficient documentation

## 2016-05-14 DIAGNOSIS — I4891 Unspecified atrial fibrillation: Secondary | ICD-10-CM | POA: Diagnosis present

## 2016-05-14 DIAGNOSIS — G20 Parkinson's disease: Secondary | ICD-10-CM | POA: Insufficient documentation

## 2016-05-14 DIAGNOSIS — J45909 Unspecified asthma, uncomplicated: Secondary | ICD-10-CM | POA: Insufficient documentation

## 2016-05-14 NOTE — ED Provider Notes (Signed)
[facility]-EMERGENCY DEPT Provider Note   CSN: [id number] Arrival date & time: 05/14/16  1035     History   Chief Complaint Chief Complaint  Patient presents with  . Atrial Fibrillation    HPI Sharon Jenkins is a 65 y.o. female.   Palpitations   This is a recurrent problem. The current episode started 1 to 2 hours ago. The problem occurs every several days. The problem has been resolved. The problem is associated with stress. Associated symptoms include dizziness. Pertinent negatives include no fever and no near-syncope.    Past Medical History:  Diagnosis Date  . Anxiety   . Asthma   . Atrial fibrillation (HCC)   . Chronic bronchitis (HCC)    "I'm good for a case a year" (05/02/2015)  . Depression   . DVT (deep venous thrombosis) (HCC)    "don't remember which side"  . Parkinson disease (HCC) dx'd ~ 2009  . Sleep apnea    "I chose not to wear a mask" (05/02/2015)  . Spinal stenosis of lumbar region   . Walking pneumonia X 1    Patient Active Problem List   Diagnosis Date Noted  . UTI (urinary tract infection) 04/21/2016  . Left sided numbness 01/14/2016  . Hypotension, unspecified hypotension type 11/15/2015  . Anxiety and depression 07/04/2015  . Anticoagulated 07/04/2015  . Weakness 05/02/2015  . Parkinson disease (HCC) 05/02/2015  . Memory deficit 05/02/2015  . PAF (paroxysmal atrial fibrillation) (HCC) 05/02/2015    Past Surgical History:  Procedure Laterality Date  . ABDOMINAL HYSTERECTOMY  ~ 2006  . BACK SURGERY    . CATARACT EXTRACTION W/ INTRAOCULAR LENS  IMPLANT, BILATERAL Bilateral 1998  . DILATION AND CURETTAGE OF UTERUS  1974  . GENIOPLASTY  ~ [id number]   "placed screw and bone matter in my chin & brought it forward"  . KNEE ARTHROSCOPY Left    "meniscus tear"  . LUMBAR DISC ARTHROPLASTY  1991  . TUBAL LIGATION  05/1978  . VARICOSE VEIN SURGERY Right 1987    OB History    No data available       Home Medications    Prior to Admission  medications   Medication Sig Start Date End Date Taking? Authorizing Provider  acetaminophen (TYLENOL) 325 MG tablet Take 650 mg by mouth at bedtime.    Yes Historical Provider, MD  Artificial Tear Ointment (DRY EYES OP) Place 1 drop into both eyes 2 (two) times daily.    Yes Historical Provider, MD  Calcium Carbonate-Vit D-Min (CALCIUM 600+D3 PLUS MINERALS PO) Take 1 tablet by mouth daily.   Yes Historical Provider, MD  carbidopa-levodopa (SINEMET IR) 25-100 MG per tablet Take 1.5 tablets by mouth every 3 (three) hours. 7-8 times daily   Yes Historical Provider, MD  clonazePAM (KLONOPIN) 0.5 MG tablet Take 0.25-0.5 mg by mouth 2 (two) times daily. Take 1/2 tablet by mouth in am and take 1 tablet by mouth at bedtime   Yes Historical Provider, MD  ELIQUIS 5 MG TABS tablet TAKE 1 TABLET BY MOUTH 2 TIMES DAILY 11/20/15  Yes Newman Niponna C Carroll, NP  Multiple Vitamin (MULTIVITAMIN WITH MINERALS) TABS tablet Take 1 tablet by mouth daily.   Yes Historical Provider, MD  Ropinirole HCl 6 MG TB24 TAKE 1 TABLET BY MOUTH 2 TIMES DAILY 03/22/16  Yes Rebecca S Tat, DO  Vilazodone HCl (VIIBRYD) 10 MG TABS TAKE 1 TABLET BY MOUTH ONCE DAILY 01/16/16  Yes Historical Provider, MD    Family History Family  History  Problem Relation Age of Onset  . Heart attack Mother   . Hypertension Father   . Stroke Father   . Stroke Sister   . Epilepsy Daughter     Social History Social History  Substance Use Topics  . Smoking status: Never Smoker  . Smokeless tobacco: Never Used  . Alcohol use No     Allergies   Lidocaine   Review of Systems Review of Systems  Constitutional: Negative for fever.  Cardiovascular: Positive for palpitations. Negative for near-syncope.  Neurological: Positive for dizziness.  All other systems reviewed and are negative.    Physical Exam Updated Vital Signs BP 131/71   Pulse 82   Temp 98.2 F (36.8 C) (Oral)   Resp 21   SpO2 97%   Physical Exam  Constitutional: She is oriented  to person, place, and time. She appears well-developed and well-nourished.  HENT:  Head: Normocephalic and atraumatic.  Eyes: Conjunctivae and EOM are normal.  Neck: Normal range of motion.  Cardiovascular: Normal rate and regular rhythm.   Pulmonary/Chest: No stridor. No respiratory distress.  Abdominal: She exhibits no distension.  Musculoskeletal: Normal range of motion. She exhibits no edema or deformity.  Neurological: She is alert and oriented to person, place, and time.  Slight tremor  Nursing note and vitals reviewed.    ED Treatments / Results  Labs (all labs ordered are listed, but only abnormal results are displayed) Labs Reviewed  URINE CULTURE    EKG  EKG Interpretation None       Radiology No results found.  Procedures Procedures (including critical care time)  Medications Ordered in ED Medications - No data to display   Initial Impression / Assessment and Plan / ED Course  I have reviewed the triage vital signs and the nursing notes.  Pertinent labs & imaging results that were available during my care of the patient were reviewed by me and considered in my medical decision making (see chart for details).  Clinical Course     Palpitations earlier assocaited with stress earlier today, found to be in RVR with EMS. Here normal Sinus rhythm but slightly low BP that corrected easily with fluids. Discussed with her cardiologist who recommended ablation v tikosyn so family will help her make that decision. Multiple normal bp's over a couple hours. No more arrhythmias. Workup otherwise negative. Plan for dc to care of caregiver with cardiology follow up.   Final Clinical Impressions(s) / ED Diagnoses   Final diagnoses:  Atrial fibrillation with RVR [facility](HCC)  Anxiety    New Prescriptions Discharge Medication List as of 05/14/2016  1:49 PM       Marily MemosJason Bashar Milam, MD 05/16/16 0710

## 2016-05-14 NOTE — ED Notes (Signed)
MD at bedside to assess hypotension and talk with caregiver.

## 2016-05-14 NOTE — ED Notes (Signed)
 bolus started by previous RN. Infusing currently. MD aware of continued hypotension.

## 2016-05-14 NOTE — ED Triage Notes (Signed)
Pt states she woke up this morning with weakness, sob and heart palpitations. Pt has a hx of afib as well as parkinson's disease. Pt denies pain at this time and states she feels tight and weak.

## 2016-05-14 NOTE — ED Notes (Signed)
Assisted patient to bedside commode

## 2016-05-14 NOTE — ED Notes (Signed)
Informed Dr. Clayborne DanaMesner that pt was having numbness in her right leg and left arm.

## 2016-05-14 NOTE — ED Notes (Signed)
Placed patient into a gown and on the monitor did ekg shown to Dr Clayborne Danamesner

## 2016-05-21 ENCOUNTER — Telehealth: Payer: Self-pay | Admitting: Neurology

## 2016-05-21 NOTE — Telephone Encounter (Signed)
Left message on machine for patient to call back.

## 2016-05-21 NOTE — Telephone Encounter (Signed)
Patient called and states that she needs to talk to someone about medication please call [phone]

## 2016-05-21 NOTE — Telephone Encounter (Signed)
PT called and said she has a question for Dr Trilby Leaverat/Dawn CB# [phone]

## 2016-05-22 ENCOUNTER — Ambulatory Visit: Payer: Medicare Other | Admitting: Licensed Clinical Social Worker

## 2016-05-22 ENCOUNTER — Telehealth: Payer: Self-pay | Admitting: Neurology

## 2016-05-22 NOTE — Telephone Encounter (Signed)
Sounds like this is for anxiety, which she has had a significant amount of and sees psychiatry for.  I don't treat this.  I don't recommend daytime klonopin/benzo's in my PD patients.  If the qhs is for RBD that is different, but doesn't account for day dosage.

## 2016-05-22 NOTE — Telephone Encounter (Signed)
Your last note states can continue Clonazepam 1 tablet at night - it looks like she is on 1/2 in the morning and 1 at night. You have never prescribed this. Please advise if okay to fill.

## 2016-05-22 NOTE — Telephone Encounter (Signed)
Sharon Jenkins 2050/07/28. Darel HongJudy (nurse from [facility] called. Her # [phone]. She needs a refill on Klonopin. Thank you

## 2016-05-22 NOTE — Telephone Encounter (Signed)
Patient's phone keeps going straight to voicemail. Will await her call back.

## 2016-05-22 NOTE — Telephone Encounter (Signed)
Darel HongJudy, RN, made aware this RX comes from psychiatry due to day time dosages.
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
## 2016-06-01 ENCOUNTER — Encounter (HOSPITAL_COMMUNITY): Payer: Self-pay | Admitting: Emergency Medicine

## 2016-06-01 ENCOUNTER — Emergency Department (HOSPITAL_COMMUNITY)
Admission: EM | Admit: 2016-06-01 | Discharge: 2016-06-01 | Disposition: A | Discharge status: Home / Self Care | Payer: Medicare Other | Attending: Emergency Medicine | Admitting: Emergency Medicine

## 2016-06-01 DIAGNOSIS — M5136 Other intervertebral disc degeneration, lumbar region: Secondary | ICD-10-CM | POA: Insufficient documentation

## 2016-06-01 DIAGNOSIS — G20 Parkinson's disease: Secondary | ICD-10-CM | POA: Insufficient documentation

## 2016-06-01 DIAGNOSIS — N390 Urinary tract infection, site not specified: Secondary | ICD-10-CM | POA: Insufficient documentation

## 2016-06-01 DIAGNOSIS — F329 Major depressive disorder, single episode, unspecified: Secondary | ICD-10-CM | POA: Insufficient documentation

## 2016-06-01 DIAGNOSIS — M62838 Other muscle spasm: Secondary | ICD-10-CM

## 2016-06-01 DIAGNOSIS — Z7901 Long term (current) use of anticoagulants: Secondary | ICD-10-CM | POA: Diagnosis not present

## 2016-06-01 DIAGNOSIS — M48061 Spinal stenosis, lumbar region without neurogenic claudication: Secondary | ICD-10-CM | POA: Diagnosis not present

## 2016-06-01 DIAGNOSIS — F32A Depression, unspecified: Secondary | ICD-10-CM

## 2016-06-01 DIAGNOSIS — J45909 Unspecified asthma, uncomplicated: Secondary | ICD-10-CM | POA: Diagnosis not present

## 2016-06-01 LAB — URINALYSIS, ROUTINE W REFLEX MICROSCOPIC
Bilirubin Urine: NEGATIVE
GLUCOSE, UA: NEGATIVE mg/dL
HGB URINE DIPSTICK: NEGATIVE
Ketones, ur: 5 mg/dL — AB
Nitrite: NEGATIVE
PH: 7 (ref 5.0–8.0)
Protein, ur: NEGATIVE mg/dL
SPECIFIC GRAVITY, URINE: 1.01 (ref 1.005–1.030)

## 2016-06-01 LAB — COMPREHENSIVE METABOLIC PANEL
ALK PHOS: 58 U/L (ref 38–126)
AST: 26 U/L (ref 15–41)
Albumin: 3.6 g/dL (ref 3.5–5.0)
Anion gap: 11 (ref 5–15)
BUN: 11 mg/dL (ref 6–20)
CALCIUM: 9.5 mg/dL (ref 8.9–10.3)
CHLORIDE: 104 mmol/L (ref 101–111)
CO2: 25 mmol/L (ref 22–32)
CREATININE: 0.78 mg/dL (ref 0.44–1.00)
Glucose, Bld: 101 mg/dL — ABNORMAL HIGH (ref 65–99)
Potassium: 3.7 mmol/L (ref 3.5–5.1)
Sodium: 140 mmol/L (ref 135–145)
TOTAL PROTEIN: 6.6 g/dL (ref 6.5–8.1)
Total Bilirubin: 0.8 mg/dL (ref 0.3–1.2)

## 2016-06-01 LAB — CBC WITH DIFFERENTIAL/PLATELET
Basophils Absolute: 0 10*3/uL (ref 0.0–0.1)
Basophils Relative: 1 %
EOS PCT: 5 %
Eosinophils Absolute: 0.3 10*3/uL (ref 0.0–0.7)
HCT: 39.5 % (ref 36.0–46.0)
Hemoglobin: 13.1 g/dL (ref 12.0–15.0)
LYMPHS ABS: 2.1 10*3/uL (ref 0.7–4.0)
LYMPHS PCT: 37 %
MCH: 30.7 pg (ref 26.0–34.0)
MCHC: 33.2 g/dL (ref 30.0–36.0)
MCV: 92.5 fL (ref 78.0–100.0)
Monocytes Absolute: 0.6 10*3/uL (ref 0.1–1.0)
Monocytes Relative: 10 %
Neutro Abs: 2.6 10*3/uL (ref 1.7–7.7)
Neutrophils Relative %: 47 %
PLATELETS: 240 10*3/uL (ref 150–400)
RBC: 4.27 MIL/uL (ref 3.87–5.11)
RDW: 12.8 % (ref 11.5–15.5)
WBC: 5.6 10*3/uL (ref 4.0–10.5)

## 2016-06-01 LAB — CK: CK TOTAL: 105 U/L (ref 38–234)

## 2016-06-01 MED ORDER — ROPINIROLE HCL ER 6 MG PO TB24: 1.0000 | ORAL_TABLET | Freq: Two times a day (BID) | ORAL | Status: DC

## 2016-06-01 MED ORDER — PREDNISONE 20 MG PO TABS: ORAL_TABLET | ORAL | 0 refills | Status: DC

## 2016-06-01 MED ORDER — CARBIDOPA-LEVODOPA 25-100 MG PO TABS
1.5000 | ORAL_TABLET | ORAL | Status: DC
  Administered 2016-06-01: 1.5 via ORAL
  Filled 2016-06-01 (×4): qty 1.5

## 2016-06-01 MED ORDER — SODIUM CHLORIDE 0.9 % IV BOLUS (SEPSIS)
500.0000 mL | Freq: Once | INTRAVENOUS | Status: AC
  Administered 2016-06-01: 500 mL via INTRAVENOUS

## 2016-06-01 MED ORDER — VILAZODONE HCL 10 MG PO TABS
10.0000 mg | ORAL_TABLET | Freq: Every day | ORAL | Status: DC
  Filled 2016-06-01: qty 1

## 2016-06-01 MED ORDER — CEPHALEXIN 500 MG PO CAPS: 500.0000 mg | ORAL_CAPSULE | Freq: Three times a day (TID) | ORAL | 0 refills | Status: DC

## 2016-06-01 MED ORDER — CEPHALEXIN 250 MG PO CAPS
500.0000 mg | ORAL_CAPSULE | Freq: Once | ORAL | Status: AC
  Administered 2016-06-01: 500 mg via ORAL
  Filled 2016-06-01: qty 2

## 2016-06-01 MED ORDER — APIXABAN 5 MG PO TABS
5.0000 mg | ORAL_TABLET | Freq: Two times a day (BID) | ORAL | Status: DC
Start: 2016-06-01 — End: 2016-06-01
  Administered 2016-06-01: 5 mg via ORAL
  Filled 2016-06-01: qty 1

## 2016-06-01 MED ORDER — PREDNISONE 20 MG PO TABS
60.0000 mg | ORAL_TABLET | Freq: Once | ORAL | Status: AC
  Administered 2016-06-01: 60 mg via ORAL
  Filled 2016-06-01: qty 3

## 2016-06-01 MED ORDER — ACETAMINOPHEN 325 MG PO TABS: 650.0000 mg | ORAL_TABLET | Freq: Every day | ORAL | Status: DC

## 2016-06-01 MED ORDER — CLONAZEPAM 0.5 MG PO TABS: 0.2500 mg | ORAL_TABLET | Freq: Two times a day (BID) | ORAL | Status: DC

## 2016-06-01 NOTE — ED Provider Notes (Addendum)
Dr Blinda LeatherwoodPollina signed pt out at 0730 [facility].  He indicates that he anticipates that pt will be d/c to [facility], and that pts doctor has arranged to start on new anti-dep medication in the next couple of days.   BH team evaluation has been completing and they are recommending d/c from ED with outpatient follow up (per their note, pasted below):  [facility] Assessment Date of Service: 06/01/2016 8:22 AM Earline MayotteEugene T Naughton, Counselor  Psychiatry    [] Hide copied text [] Hover for attribution information Tele Assessment Note   Marianita Cyndie ChimeH Banfill is a 65 y.o. female who [facility] in [facility].  Per daughter Lynwood Dawley(Dara [phone]), Pt had an "episode" at her assisted living facility this morning -- she was very agitated and complained of significant pain.  Pt is diagnosed with Parkinson's Disease among other conditions.  Pt was transported to the hospital, treated, and was set for discharge.  When told she was going to be discharge, she became very agitated and began screaming about pain.  A TTS consult was made; daughter suggested that Pt has depression.  Pt provided history.  She stated that she is treated for depression and anxiety by a Dr. Chilton SiGreen, but she denied suicidal ideation, any past suicide attempts, auditory/visual hallucinations, and homicidal ideation.  Pt stated that she is upset due to significant bodily pain, and during the assessment, she asked for a consultation with her neurologist.  Pt stated that she was recently evaluated by a neurosurgeon and seeks relief from pain.  "I don't need a psychiatrist.  I need my neurologist."  Author spoke with Pt's daughter Lynwood DawleyDara.  Dara reported that Pt makes comments about wanting to die -- "She'll say, 'I can't take it anymore.'"    Overall impression during assessment is that Pt is in significant bodily pain and is seeking immediate relief from pain radiating into legs.  Pt repeated several times that she wants to see her  neurologist (Dr. Herminio Headsebecca Tatt).    During assessment, Pt presented as alert and oriented x4.  She had good eye contact and was cooperative.  Pt's demeanor was restless, and she endorsed significant back and lower leg pain.  Pt's mood was depressed and preoccupied, and affect was congruent.  Pt endorsed a history of depressive and anxiety symptoms, but she denied suicidal ideation, past suicide attempt, and auditory/visual hallucination.  She endorsed despondency, which she related to her physical condition, irritability, and feeling worthless.  Pt's speech was normal in rate, rhythm, and volume.  Thought processes were within normal range.  Thought content was logical and goal-oriented.  There was no evidence of delusion.  Pt's memory and concentration were grossly intact.  Impulse control, judgment, and insight were fair.    Consulted with L. Davis NP who determined that Pt does not meet inpatient criteria.  Recommended discharge to her current provider for evaluation of medication.  Diagnosis: Major Depressive Disorder, Recurrent, Moderate; Parkinson's Disease       Patient does note some dysuria, and ?incontinence - it appears possible uti on labs. Will rx. Keflex po.   Patient notes increased back pain and numbness/tingling legs.  States similar symptoms in past, but worse. +sym reflexes. Normal tone.  She indicates Dr Venetia MaxonStern is following her for same.  I discussed case with Dr Jordan LikesPool, on call for Dr Venetia MaxonStern - he reviewed notes/charts, and indicates would rec po steroid tx and f/u with Dr Venetia MaxonStern in office, as two MRs for  same in past few months, no additional imaging recommended today.   pred po.  Pt moving legs better, more comfortable. Afeb.   Patient currently appears stable for d/c.           Cathren LaineKevin Norely Schlick, MD 06/01/16 1022

## 2016-06-01 NOTE — ED Provider Notes (Addendum)
[facility]-EMER[facility]ENCY DEPT Provider Note   CSN: [id number] Arrival date & time: 06/01/16  0249  By signing my name below, I, Freida Busman, attest that this documentation has been prepared under the direction and in the presence of Gilda Crease, MD . Electronically Signed: Freida Busman, Scribe. 06/01/2016. 3:07 AM.  History   Chief Complaint Chief Complaint  Patient presents with  . Spasms     The history is provided by the EMS personnel. No language interpreter was used.    HPI Comments:  Sharon Jenkins is a 65 y.o. female with a history of Parkinson's disease who presents to the Emergency Department via EMS complaining of diffuse muscle spasms which began a few hours PTA. Pt has a h/o same secondary to parkinson's and reported to EMS that this episode was the worst episode she has had.  She was given Versed en route with moderate improvement, per EMS.  Pt arrives from an assisted living facility. No other associated symptoms noted.   Past Medical History:  Diagnosis Date  . Anxiety   . Asthma   . Atrial fibrillation (HCC)   . Chronic bronchitis (HCC)    "I'm good for a case a year" (05/02/2015)  . Depression   . DVT (deep venous thrombosis) (HCC)    "don't remember which side"  . Parkinson disease (HCC) dx'd ~ 2009  . Sleep apnea    "I chose not to wear a mask" (05/02/2015)  . Spinal stenosis of lumbar region   . Walking pneumonia X 1    Patient Active Problem List   Diagnosis Date Noted  . UTI (urinary tract infection) 04/21/2016  . Left sided numbness 01/14/2016  . Hypotension, unspecified hypotension type 11/15/2015  . Anxiety and depression 07/04/2015  . Anticoagulated 07/04/2015  . Weakness 05/02/2015  . Parkinson disease (HCC) 05/02/2015  . Memory deficit 05/02/2015  . PAF (paroxysmal atrial fibrillation) (HCC) 05/02/2015    Past Surgical History:  Procedure Laterality Date  . ABDOMINAL HYSTERECTOMY  ~ 2006  . BACK SURGERY    . CATARACT EXTRACTION W/  INTRAOCULAR LENS  IMPLANT, BILATERAL Bilateral 1998  . DILATION AND CURETTAGE OF UTERUS  1974  . GENIOPLASTY  ~ 4098-1191   "placed screw and bone matter in my chin & brought it forward"  . KNEE ARTHROSCOPY Left    "meniscus tear"  . LUMBAR DISC ARTHROPLASTY  1991  . TUBAL LIGATION  05/1978  . VARICOSE VEIN SURGERY Right 1987    OB History    No data available       Home Medications    Prior to Admission medications   Medication Sig Start Date End Date Taking? Authorizing Provider  acetaminophen (TYLENOL) 325 MG tablet Take 650 mg by mouth at bedtime.     Historical Provider, MD  Artificial Tear Ointment (DRY EYES OP) Place 1 drop into both eyes 2 (two) times daily.     Historical Provider, MD  Calcium Carbonate-Vit D-Min (CALCIUM 600+D3 PLUS MINERALS PO) Take 1 tablet by mouth daily.    Historical Provider, MD  carbidopa-levodopa (SINEMET IR) 25-100 MG per tablet Take 1.5 tablets by mouth every 3 (three) hours. 7-8 times daily    Historical Provider, MD  clonazePAM (KLONOPIN) 0.5 MG tablet Take 0.25-0.5 mg by mouth 2 (two) times daily. Take 1/2 tablet by mouth in am and take 1 tablet by mouth at bedtime    Historical Provider, MD  ELIQUIS 5 MG TABS tablet TAKE 1 TABLET BY MOUTH 2 TIMES  DAILY 11/20/15   Newman Niponna C Carroll, NP  Multiple Vitamin (MULTIVITAMIN WITH MINERALS) TABS tablet Take 1 tablet by mouth daily.    Historical Provider, MD  Ropinirole HCl 6 MG TB24 TAKE 1 TABLET BY MOUTH 2 TIMES DAILY 03/22/16   Octaviano Battyebecca S Tat, DO  Vilazodone HCl (VIIBRYD) 10 MG TABS TAKE 1 TABLET BY MOUTH ONCE DAILY 01/16/16   Historical Provider, MD    Family History Family History  Problem Relation Age of Onset  . Heart attack Mother   . Hypertension Father   . Stroke Father   . Stroke Sister   . Epilepsy Daughter     Social History Social History  Substance Use Topics  . Smoking status: Never Smoker  . Smokeless tobacco: Never Used  . Alcohol use No     Allergies   Lidocaine   Review  of Systems Review of Systems  Constitutional: Negative for chills and fever.  Respiratory: Negative for shortness of breath.   Cardiovascular: Negative for chest pain.  Musculoskeletal: Positive for myalgias.  All other systems reviewed and are negative.    Physical Exam Updated Vital Signs BP 108/72   Pulse 70   Temp 97.7 F (36.5 C) (Oral)   Resp 15   Ht 5\' 6"  (1.676 m)   Wt 141 lb (64 kg)   SpO2 94%   BMI 22.76 kg/m   Physical Exam  Constitutional: She is oriented to person, place, and time. She appears well-developed and well-nourished. No distress.  HENT:  Head: Normocephalic and atraumatic.  Right Ear: Hearing normal.  Left Ear: Hearing normal.  Nose: Nose normal.  Mouth/Throat: Oropharynx is clear and moist and mucous membranes are normal.  Eyes: Conjunctivae and EOM are normal. Pupils are equal, round, and reactive to light.  Neck: Neck supple.  Cardiovascular: Regular rhythm, S1 normal and S2 normal.  Exam reveals no gallop and no friction rub.   No murmur heard. Pulmonary/Chest: Effort normal and breath sounds normal. No respiratory distress. She exhibits no tenderness.  Abdominal: Soft. Normal appearance and bowel sounds are normal. There is no hepatosplenomegaly. There is no tenderness. There is no rebound, no guarding, no tenderness at McBurney's point and negative Murphy's sign. No hernia.  Musculoskeletal:  Diffuse generalized decreased tone consistent with Parkinson's   Neurological: She is alert and oriented to person, place, and time. She has normal strength. No cranial nerve deficit. GCS eye subscore is 4. GCS verbal subscore is 5. GCS motor subscore is 6.  Skin: Skin is warm, dry and intact. No rash noted. No cyanosis.  Psychiatric: She has a normal mood and affect. Her speech is normal and behavior is normal.  Nursing note and vitals reviewed.    ED Treatments / Results  DIAGNOSTIC STUDIES:  Oxygen Saturation is 98% on RA, normal by my  interpretation.    COORDINATION OF CARE:  3:01 AM Discussed treatment plan with pt and family at bedside and they agreed to plan.   Labs (all labs ordered are listed, but only abnormal results are displayed) Labs Reviewed  COMPREHENSIVE METABOLIC PANEL - Abnormal; Notable for the following:       Result Value   Glucose, Bld 101 (*)    ALT <5 (*)    All other components within normal limits  URINALYSIS, ROUTINE W REFLEX MICROSCOPIC - Abnormal; Notable for the following:    Ketones, ur 5 (*)    Leukocytes, UA LARGE (*)    Bacteria, UA RARE (*)    Squamous  Epithelial / LPF 0-5 (*)    All other components within normal limits  CBC WITH DIFFERENTIAL/PLATELET  CK    EKG  EKG Interpretation None       Radiology No results found.  Procedures Procedures (including critical care time)  Medications Ordered in ED Medications  sodium chloride 0.9 % bolus 500 mL (0 mLs Intravenous Stopped 06/01/16 0556)     Initial Impression / Assessment and Plan / ED Course  I have reviewed the triage vital signs and the nursing notes.  Pertinent labs & imaging results that were available during my care of the patient were reviewed by me and considered in my medical decision making (see chart for details).  Clinical Course    Patient brought to the emergency department for evaluation of spasms. Patient reportedly has a history of severe for body muscle spasms secondary to her history of Parkinson's. Patient was administered 0.5 mg of Versed prehospital by EMS and is improving. Workup is negative. Patient symptomatically improved and appropriate for return assisted living.  Addendum: Patient was sleeping comfortably without any spasms or abnormal findings. She was awakened to tell her that she was going home at which time she began to shake all over, become agitated and extremely anxious. She then started to report that her legs were numb and she could not feel them or move them. I did call the  daughter at home and discuss these findings with her. Daughter expresses concerns over the patient's mental well-being. Patient has been seen by psychiatry several times in the last month or so for worsening depression and anxiety. Daughter is concerned about statements the patient has been making about "not wanting to go on". Patient is not actively suicidal, however. Daughter does report that the spasms as well as the symptoms she is exhibiting currently are consistent with her anxiety and panic attack. Patient did have an MRI performed just over one month ago and there were no acute findings. Current presentation is consistent with psychiatric illness and will require psychiatric evaluation. Patient medically clear for psychiatric evaluation.  Final Clinical Impressions(s) / ED Diagnoses   Final diagnoses:  Muscle spasm  Depression, unspecified depression type  Panic attack    New Prescriptions New Prescriptions   No medications on file   I personally performed the services described in this documentation, which was scribed in my presence. The recorded information has been reviewed and is accurate.     Gilda Creasehristopher J Pollina, MD 06/01/16 [id number]    Gilda Creasehristopher J Pollina, MD 06/01/16 [id number]    Gilda Creasehristopher J Pollina, MD 06/01/16 [phone]

## 2016-06-01 NOTE — ED Notes (Signed)
Patient states "I can move my legs now.  That's how fast the medicine works." and asking to be allowed to walk.  Discussed waiting to give medicine more time to work before she tries to stand.  Patient agreeable to plan.

## 2016-06-01 NOTE — ED Triage Notes (Signed)
Ems pt from [facility] reported started having muscle spasms at 2330 with no improvement. Pt has a history of parkinsons and has had this happen in the past. EMS gave 0.5mg  Versed PTA with no change

## 2016-06-01 NOTE — Discharge Instructions (Addendum)
It was our pleasure to provide your ER care today - we hope that you feel better.  Take prednisone as prescribed.   The lab work shows a possible urine infection - take antibiotic (keflex) as prescribed.   Follow up with your doctor Monday - also have your primary care doctor coordinate close follow up with your neurologist, back specialist/neurosurgeon, and psychiatrist/counselor.   We discussed your case with Dr Fredrich BirksStern's partner - he indicates for you to follow up with Dr Venetia MaxonStern in the office in the next 1-2 weeks.   Return to ER if worse, new symptoms, fevers, intractable pain, progressive numbness/weakness, or other medical emergency.

## 2016-06-01 NOTE — BH Assessment (Signed)
Tele Assessment Note   Sharon Jenkins is a 65 y.o. female [facility].  Per daughter Sharon Jenkins(Sharon Jenkins [phone]), Pt had an "episode" at her assisted living facility this morning -- she was very agitated and complained of significant pain.  Pt is diagnosed with Parkinson's Disease among other conditions.  Pt was transported to the hospital, treated, and was set for discharge.  When told she was going to be discharge, she became very agitated and began screaming about pain.  A TTS consult was made; daughter suggested that Pt has depression.  Pt provided history.  She stated that she is treated for depression and anxiety by a Dr. Chilton SiGreen, but she denied suicidal ideation, any past suicide attempts, auditory/visual hallucinations, and homicidal ideation.  Pt stated that she is upset due to significant bodily pain, and during the assessment, she asked for a consultation with her neurologist.  Pt stated that she was recently evaluated by a neurosurgeon and seeks relief from pain.  "I don't need a psychiatrist.  I need my neurologist."  Author spoke with Pt's daughter Sharon Jenkins.  Sharon Jenkins reported that Pt makes comments about wanting to die -- "She'll say, 'I can't take it anymore.'"    Overall impression during assessment is that Pt is in significant bodily pain and is seeking immediate relief from pain radiating into legs.  Pt repeated several times that she wants to see her neurologist (Dr. Herminio Headsebecca Tatt).    During assessment, Pt presented as alert and oriented x4.  She had good eye contact and was cooperative.  Pt's demeanor was restless, and she endorsed significant back and lower leg pain.  Pt's mood was depressed and preoccupied, and affect was congruent.  Pt endorsed a history of depressive and anxiety symptoms, but she denied suicidal ideation, past suicide attempt, and auditory/visual hallucination.  She endorsed despondency, which she related to her physical condition, irritability,  and feeling worthless.  Pt's speech was normal in rate, rhythm, and volume.  Thought processes were within normal range.  Thought content was logical and goal-oriented.  There was no evidence of delusion.  Pt's memory and concentration were grossly intact.  Impulse control, judgment, and insight were fair.    Consulted with L. Davis NP who determined that Pt does not meet inpatient criteria.  Recommended discharge to her current provider for evaluation of medication.  Diagnosis: Major Depressive Disorder, Recurrent, Moderate; Parkinson's Disease  Past Medical History:  Past Medical History:  Diagnosis Date  . Anxiety   . Asthma   . Atrial fibrillation (HCC)   . Chronic bronchitis (HCC)    "I'm good for a case a year" (05/02/2015)  . Depression   . DVT (deep venous thrombosis) (HCC)    "don't remember which side"  . Parkinson disease (HCC) dx'd ~ 2009  . Sleep apnea    "I chose not to wear a mask" (05/02/2015)  . Spinal stenosis of lumbar region   . Walking pneumonia X 1    Past Surgical History:  Procedure Laterality Date  . ABDOMINAL HYSTERECTOMY  ~ 2006  . BACK SURGERY    . CATARACT EXTRACTION W/ INTRAOCULAR LENS  IMPLANT, BILATERAL Bilateral 1998  . DILATION AND CURETTAGE OF UTERUS  1974  . GENIOPLASTY  ~ [id number]   "placed screw and bone matter in my chin & brought it forward"  . KNEE ARTHROSCOPY Left    "meniscus tear"  . LUMBAR DISC ARTHROPLASTY  1991  . TUBAL LIGATION  05/1978  . VARICOSE VEIN SURGERY Right 1987    Family History:  Family History  Problem Relation Age of Onset  . Heart attack Mother   . Hypertension Father   . Stroke Father   . Stroke Sister   . Epilepsy Daughter     Social History:  reports that she has never smoked. She has never used smokeless tobacco. She reports that she does not drink alcohol or use drugs.  Additional Social History:  Alcohol / Drug Use Pain Medications: See PTA Prescriptions: See PTA Over the Counter: See  PTA History of alcohol / drug use?: No history of alcohol / drug abuse  CIWA: CIWA-Ar BP: 152/83 Pulse Rate: 97 COWS:    PATIENT STRENGTHS: (choose at least two) Average or above average intelligence Communication skills  Allergies:  Allergies  Allergen Reactions  . Lidocaine Hives    Home Medications:  (Not in a hospital admission)  OB/GYN Status:  No LMP recorded. Patient has had a hysterectomy.  General Assessment Data [facility] T[facility] Assessment: In system Is this a Tele or Face-to-Face Assessment?: Tele Assessment Is this an Initial Assessment or a Re-assessment for this encounter?: Initial Assessment Is patient pregnant?: No Pregnancy Status: No Living Arrangements: Other (Comment) ([facility]) Can pt return to current living arrangement?: Yes Admission Status: Voluntary Is patient capable of signing voluntary admission?: Yes Referral Source: Other (Assisted living facility) Insurance type: BCBS     Crisis Care Plan Living Arrangements: Other (Comment) Sharon Jenkins([facility]) Name of Psychiatrist: Dr Chilton SiGreen Name of Therapist: Unknown  Education Status Is patient currently in school?: No  Risk to self with the past 6 months Suicidal Ideation: No (Per daughter, Pt makes statements about wanting to die) Has patient been a risk to self within the past 6 months prior to admission? : No Suicidal Intent: No Has patient had any suicidal intent within the past 6 months prior to admission? : No Is patient at risk for suicide?: No Suicidal Plan?: No Has patient had any suicidal plan within the past 6 months prior to admission? : No Access to Means: No What has been your use of drugs/alcohol within the last 12 months?: Denied Previous Attempts/Gestures: No Intentional Self Injurious Behavior: None Family Suicide History: Unknown Recent stressful life event(s): Other (Comment), Recent negative physical changes  (Increased pain; change in medication) Persecutory voices/beliefs?: No Depression: Yes Depression Symptoms: Despondent, Tearfulness, Feeling worthless/self pity Substance abuse history and/or treatment for substance abuse?: No Suicide prevention information given to non-admitted patients: Not applicable  Risk to Others within the past 6 months Homicidal Ideation: No Does patient have any lifetime risk of violence toward others beyond the six months prior to admission? : No Thoughts of Harm to Others: No Current Homicidal Intent: No Current Homicidal Plan: No Access to Homicidal Means: No History of harm to others?: No Assessment of Violence: None Noted Does patient have access to weapons?: No Criminal Charges Pending?: No Does patient have a court date: No Is patient on probation?: No  Psychosis Hallucinations: None noted Delusions: None noted  Mental Status Report Appearance/Hygiene: In scrubs, Unremarkable Eye Contact: Good Motor Activity: Restlessness Speech: Logical/coherent, Unremarkable Level of Consciousness: Alert, Restless, Other (Comment) (Pt reports significant body pain) Mood: Preoccupied, Depressed (Depressed about physical state; reports pain) Affect: Preoccupied Anxiety Level: None Thought Processes: Coherent, Relevant Judgement: Partial Orientation: Person, Place, Time, Situation, Appropriate for developmental age Obsessive Compulsive Thoughts/Behaviors: None  Cognitive Functioning Concentration: Fair Memory:  Recent Intact, Remote Intact IQ: Average Insight: Fair Impulse Control: Fair Appetite: Fair Sleep: No Change Vegetative Symptoms: None  ADLScreening [facility] Assessment Services) Patient's cognitive ability adequate to safely complete daily activities?: Yes Patient able to express need for assistance with ADLs?: Yes Independently performs ADLs?: Yes (appropriate for developmental age)  Prior Inpatient Therapy Prior Inpatient Therapy: Yes Prior  Therapy Dates: 2016 Prior Therapy Facilty/Provider(s): [facility] (Per daughter, Pt went to [facility] for mental concerns) Reason for Treatment: Depressive symptoms  Prior Outpatient Therapy Prior Outpatient Therapy: Yes Prior Therapy Dates: Ongoing Prior Therapy Facilty/Provider(s): Dr. Chilton Si, MD Reason for Treatment: Depressive and anxious symptoms Does patient have an ACCT team?: No Does patient have Intensive In-House Services?  : No Does patient have [facility] services? : No Does patient have P4CC services?: No  ADL Screening (condition at time of admission) Patient's cognitive ability adequate to safely complete daily activities?: Yes Is the patient deaf or have difficulty hearing?: No Does the patient have difficulty seeing, even when wearing glasses/contacts?: No Does the patient have difficulty concentrating, remembering, or making decisions?: No Patient able to express need for assistance with ADLs?: Yes Does the patient have difficulty dressing or bathing?: No (Possibly; Pt reports significant pain in legs) Independently performs ADLs?: Yes (appropriate for developmental age) Does the patient have difficulty walking or climbing stairs?: No Weakness of Legs: Both (Pt reports significant pain) Weakness of Arms/Hands: None  Home Assistive Devices/Equipment Home Assistive Devices/Equipment: None  Therapy Consults (therapy consults require a physician order) PT Evaluation Needed: No OT Evalulation Needed: No SLP Evaluation Needed: No Abuse/Neglect Assessment (Assessment to be complete while patient is alone) Physical Abuse: Denies Verbal Abuse: Denies Sexual Abuse: Denies Exploitation of patient/patient's resources: Denies Self-Neglect: Denies Values / Beliefs Cultural Requests During Hospitalization: None Spiritual Requests During Hospitalization: None Consults Spiritual Care Consult Needed: No Social Work Consult Needed: No Merchant navy officer (For  Healthcare) Does Patient Have a Medical Advance Directive?: No Would patient like information on creating a medical advance directive?: No - Patient declined    Additional Information 1:1 In Past 12 Months?: No CIRT Risk: No Elopement Risk: No Does patient have medical clearance?: Yes (However, as of this writing, Pt may have UTI)     Disposition:  Disposition Initial Assessment Completed for this Encounter: Yes  Dorris Fetch Sharon Jenkins 06/01/2016 8:22 AM

## 2016-06-01 NOTE — ED Notes (Signed)
Patient incontinent urine.  Patient cleaned, chux replaced, patient placed in brief.

## 2016-06-01 NOTE — ED Notes (Signed)
Pt saying that she is incontinent and has wet the bed. When trying to change her, pt was screaming in pain and refusing to let me try and change her. Pt states she needs a neurologist and not a psychiatrist.

## 2016-06-02 LAB — URINE CULTURE

## 2016-06-03 ENCOUNTER — Telehealth: Payer: Self-pay | Admitting: Neurology

## 2016-06-03 NOTE — Telephone Encounter (Signed)
I have reviewed ER notes.  She has an appt here tomorrow BUT the 2 issues were pain and anxiety.  I don't treat either.  She has a neurosurgeon for her pain and apparently is treated by psychiatry for her anxiety, but seems not to be well controlled.  Dr. August LuzStern/Poole addressed pain via the ER and orders were given.  I won't be able to address her pain or anxiety tomorrow as these are not my specialty and we have discussed this with her many, many times.

## 2016-06-03 NOTE — Telephone Encounter (Signed)
Patient states that Saturday night she had a full blown tremor that had her hands locked, could not move could not speak and in really bad pain. She could not get up out bed so the EMS had to come and got to the she had to take her 5:30 morning medication at 2:30 this morning to ward off one. Please call [phone]

## 2016-06-03 NOTE — Telephone Encounter (Signed)
See ER note and please advise.  Patient requesting visit with you today, but appears this was psychiatric in nature according to ER notes.

## 2016-06-03 NOTE — Telephone Encounter (Signed)
Spoke with patient. Advised that pain and anxiety would not be discussed or treated by Dr. Arbutus Leasat, as those are not her speciality and patient is being followed by other physicians for those ailments.

## 2016-06-04 ENCOUNTER — Encounter: Payer: Self-pay | Admitting: Neurology

## 2016-06-04 ENCOUNTER — Ambulatory Visit (INDEPENDENT_AMBULATORY_CARE_PROVIDER_SITE_OTHER): Payer: Medicare Other | Admitting: Neurology

## 2016-06-04 VITALS — BP 98/68 | HR 62 | Wt 149.4 lb

## 2016-06-04 DIAGNOSIS — F5101 Primary insomnia: Secondary | ICD-10-CM | POA: Diagnosis not present

## 2016-06-04 DIAGNOSIS — G20A1 Parkinson's disease without dyskinesia, without mention of fluctuations: Secondary | ICD-10-CM

## 2016-06-04 DIAGNOSIS — F410 Panic disorder [episodic paroxysmal anxiety] without agoraphobia: Secondary | ICD-10-CM

## 2016-06-04 DIAGNOSIS — G20 Parkinson's disease: Secondary | ICD-10-CM | POA: Diagnosis not present

## 2016-06-04 NOTE — Patient Instructions (Signed)
1.  Drink plenty of water 2.  You need to go to a counselor for your anxiety (and stay with a counselor - not just one visit) 3.  Stop taking carbidopa/levodopa 25/100 in the middle of the night.   4.  If you need something for nighttime sleep, try melatonin.  Start with 3 mg at night 5.  Happy holidays! 6.  Follow up with me in 4 months

## 2016-06-05 ENCOUNTER — Telehealth: Payer: Self-pay | Admitting: Neurology

## 2016-06-05 NOTE — Telephone Encounter (Signed)
No DPR on file to speak with anyone.

## 2016-06-05 NOTE — Telephone Encounter (Signed)
Sharon Jenkins 09-12-2050. Her # [phone]. She stopped the doses of Carbidopa Levodopa and had a terrible night and not having a good day so far. She would like you to please call her. She would like to know what else she needs to do. Thank you

## 2016-06-05 NOTE — Telephone Encounter (Signed)
Spoke with patient after she called back and stated it was an "emergency" that she talk to a clinician. I asked what her emergency was she she states that she doesn't feel well. When asked to specify she states she feels a bit jittery and can't concentrate. States she didn't sleep last night because she was told to stop her Levodopa at night.  She states she has always woken up at night to take her Levodopa. After reading her office note, it states she has her home care providers wake her up to take it. She states that is wrong and her body wakes her up.  I made patient aware that lack of concentration and feeling of jitteriness are not due to her Parkinson's Disease. These are due to her anxiety and this was discussed in detail at her office visit yesterday. She was instructed, multiple times, to call her psychiatrist. She was made aware, again, that she should not be taking Levodopa in the middle of the night.  She states "that is not okay" and I made her aware that if she doesn't agree with Dr. Don Perkingat's treatment plan we would be happy to refer her to another neurologist.  She will call with any other neurologic questions.

## 2016-06-06 ENCOUNTER — Encounter (HOSPITAL_COMMUNITY): Payer: Self-pay

## 2016-06-12 ENCOUNTER — Telehealth: Payer: Self-pay | Admitting: Neurology

## 2016-06-12 NOTE — Telephone Encounter (Signed)
Patient wants to know if it is ok to only take half of the sleep aid that was recommended  to her to take it make her feel like she cant wake up if she take the whole dose. Please call her back at [phone]

## 2016-06-13 NOTE — Telephone Encounter (Signed)
Called patient back and let her know if 1/2 of the medication works then it is ok to just take that amount.

## 2016-06-18 ENCOUNTER — Telehealth (HOSPITAL_COMMUNITY): Payer: Self-pay | Admitting: *Deleted

## 2016-06-18 NOTE — Telephone Encounter (Signed)
Patient called in stating she is in afib. Woke up with it this morning around 6am. HR in the 140s BP in the 80/60s. Feeling short of breath, "elephant on chest" and fatigued. Pt was instructed to proceed to ER as she may very well need to be cardioverted as medications we can use over phone would lower blood pressure even more. Pt states she does not want to pay for ambulance ride and her family is too busy to transport her anywhere. Reiterated if symptoms worsen she should call EMS to transport to ER. Patient states she will "ride it out at home". She has not missed any doses of her eliquis. Has appt with Dr. Johney FrameAllred on 1/10 to further discuss treatment.

## 2016-06-20 ENCOUNTER — Telehealth: Payer: Self-pay | Admitting: Neurology

## 2016-06-20 NOTE — Telephone Encounter (Signed)
Patient called to see if okay to increase Melatonin.  She is currently taking Melatonin 3 mg at night. She wants to know if okay to increase since she is not sleeping well. She is waking up in the middle of the night, but it sounds like she is waking up due to pain.  I advised that increase in Melatonin may not help with sleep because it is probably due to pain, but she can increase to two tablets at bedtime.  She gets nervous and asks, "what if I don't wake up?" I advised her Melatonin does not work that way. She would be okay to take 6 mg. We just encourage patients to start at a low dose first.  She is also still taking Clonazepam 0.5 mg - 1/2 tablet at night. I also confirmed with patient that it was safe to take both medications together at bedtime.  She will call if needed.

## 2016-06-21 ENCOUNTER — Ambulatory Visit (INDEPENDENT_AMBULATORY_CARE_PROVIDER_SITE_OTHER): Payer: Medicare Other | Admitting: Licensed Clinical Social Worker

## 2016-06-21 DIAGNOSIS — F324 Major depressive disorder, single episode, in partial remission: Secondary | ICD-10-CM

## 2016-06-21 NOTE — Telephone Encounter (Signed)
Left message on voicemail making her aware she could try sublingual melatonin to help with absorption.

## 2016-06-21 NOTE — Telephone Encounter (Signed)
Yes it is okay to take higher dose, but SL melatonin at night at the lower mg may work better before raising the dosage

## 2016-06-25 ENCOUNTER — Ambulatory Visit
Admission: RE | Admit: 2016-06-25 | Discharge: 2016-06-25 | Disposition: A | Discharge status: Home / Self Care | Payer: Medicare Other | Source: Ambulatory Visit

## 2016-06-25 ENCOUNTER — Ambulatory Visit: Payer: Medicare Other | Admitting: Neurology

## 2016-06-25 DIAGNOSIS — Z1231 Encounter for screening mammogram for malignant neoplasm of breast: Secondary | ICD-10-CM

## 2016-06-26 ENCOUNTER — Institutional Professional Consult (permissible substitution): Payer: Self-pay | Admitting: Internal Medicine

## 2016-06-27 ENCOUNTER — Telehealth: Payer: Self-pay | Admitting: Neurology

## 2016-06-27 NOTE — Telephone Encounter (Signed)
Patient needs to talk to someone about sleeping medication. She feels liked she passed out this morning and she states it is very important that someone call her please call [phone]

## 2016-06-27 NOTE — Telephone Encounter (Signed)
Called patient back and she said that she had tried a different strength of melatonin and it made her very groggy.  Instructed her to go back to the strength that Dr. Arbutus Leasat.  Patient agreed with plan.

## 2016-06-27 NOTE — Telephone Encounter (Signed)
Please call.

## 2016-07-02 ENCOUNTER — Telehealth: Payer: Self-pay | Admitting: Internal Medicine

## 2016-07-02 NOTE — Telephone Encounter (Signed)
Patient is requesting a call with results if calling on 07-03-16, please call before 2pm-4pm. Thanks.

## 2016-07-03 ENCOUNTER — Institutional Professional Consult (permissible substitution): Payer: Self-pay | Admitting: Internal Medicine

## 2016-07-05 ENCOUNTER — Encounter: Payer: Medicare Other | Admitting: Neurology

## 2016-07-05 NOTE — Telephone Encounter (Signed)
Pt was actually calling for results from the urologist. Will call their office.

## 2016-07-17 ENCOUNTER — Ambulatory Visit (INDEPENDENT_AMBULATORY_CARE_PROVIDER_SITE_OTHER): Payer: Medicare Other | Admitting: Internal Medicine

## 2016-07-17 ENCOUNTER — Encounter: Payer: Self-pay | Admitting: Internal Medicine

## 2016-07-17 VITALS — BP 84/62 | HR 67 | Ht 65.0 in | Wt 152.4 lb

## 2016-07-17 DIAGNOSIS — G20 Parkinson's disease: Secondary | ICD-10-CM

## 2016-07-17 DIAGNOSIS — I480 Paroxysmal atrial fibrillation: Secondary | ICD-10-CM | POA: Diagnosis not present

## 2016-07-17 DIAGNOSIS — F418 Other specified anxiety disorders: Secondary | ICD-10-CM | POA: Diagnosis not present

## 2016-07-17 DIAGNOSIS — F419 Anxiety disorder, unspecified: Secondary | ICD-10-CM

## 2016-07-17 DIAGNOSIS — G20A1 Parkinson's disease without dyskinesia, without mention of fluctuations: Secondary | ICD-10-CM

## 2016-07-17 DIAGNOSIS — F329 Major depressive disorder, single episode, unspecified: Secondary | ICD-10-CM

## 2016-07-17 DIAGNOSIS — F32A Depression, unspecified: Secondary | ICD-10-CM

## 2016-07-17 MED ORDER — FLECAINIDE ACETATE 100 MG PO TABS: ORAL_TABLET | ORAL | 0 refills | Status: DC

## 2016-07-17 MED ORDER — METOPROLOL TARTRATE 25 MG PO TABS: ORAL_TABLET | ORAL | 0 refills | Status: DC

## 2016-07-17 NOTE — Patient Instructions (Signed)
Medication Instructions:  Your physician has recommended you make the following change in your medication:  1) Take Flecainide 100 mg as needed for fast heart rates 2) Take Metoprolol 25 mg every 6 hours as needed for fast heart rates---only take if systolic BP(top number) greater than 100   Labwork: None ordered   Testing/Procedures: None ordered   Follow-Up: Your physician recommends that you schedule a follow-up appointment in: 3 months with Dr Johney FrameAllred   Any Other Special Instructions Will Be Listed Below (If Applicable).     If you need a refill on your cardiac medications before your next appointment, please call your pharmacy.

## 2016-07-17 NOTE — Progress Notes (Signed)
Electrophysiology Office Note   Date:  07/17/2016   ID:  Barron Schmidva H Subramanian, DOB 12-24-1950, MRN [id number]  PCP:  Redmond BasemanWONG,FRANCIS PATRICK, MD   Primary Electrophysiologist: Hillis RangeJames Raahi Korber, MD    Chief Complaint  Patient presents with  . Atrial Fibrillation     History of Present Illness: Naylani H Rodney BoozeMordica is a 66 y.o. female who presents today for electrophysiology evaluation.   The patient has symptomatic recurrent paroxysmal atrial fibrillation, advanced parkinsons disease, and anxiety.  She has occasional episodes of afib.  Her caregiver notes that these seem to occur during times of anxiety.  She has had 2 episodes over the last 6 months.  Episodes typically will terminate within several hours.  She was previously tried on amiodarone but did not tolerate this medicine due to worsening "shaking and flapping of her arms/ feet" as per AF clinic note 12/26/15.  She has had one episode of afib earlier this year for which she called the afib clinic.  Her caregiver feels that in general she is doing very well. Her son works for [employer] as a rep in [place].   He has advised that she consider ablation.  Today, she denies symptoms of palpitations, chest pain, shortness of breath, orthopnea, PND, lower extremity edema, claudication, dizziness, presyncope, syncope, bleeding, or neurologic sequela. The patient is tolerating medications without difficulties and is otherwise without complaint today.    Past Medical History:  Diagnosis Date  . Anxiety   . Asthma   . Atrial fibrillation (HCC)   . Chronic bronchitis (HCC)    "I'm good for a case a year" (05/02/2015)  . Depression   . DVT (deep venous thrombosis) (HCC)    "don't remember which side"  . Parkinson disease (HCC) dx'd ~ 2009  . Sleep apnea    "I chose not to wear a mask" (05/02/2015)  . Spinal stenosis of lumbar region   . Walking pneumonia X 1   Past Surgical History:  Procedure Laterality Date  . ABDOMINAL HYSTERECTOMY  ~ 2006   . BACK SURGERY    . CATARACT EXTRACTION W/ INTRAOCULAR LENS  IMPLANT, BILATERAL Bilateral 1998  . DILATION AND CURETTAGE OF UTERUS  1974  . GENIOPLASTY  ~ [id number]   "placed screw and bone matter in my chin & brought it forward"  . KNEE ARTHROSCOPY Left    "meniscus tear"  . LUMBAR DISC ARTHROPLASTY  1991  . TUBAL LIGATION  05/1978  . VARICOSE VEIN SURGERY Right 1987     Current Outpatient Prescriptions  Medication Sig Dispense Refill  . Artificial Tear Ointment (DRY EYES OP) Place 1 drop into both eyes 2 (two) times daily.     . Calcium Carbonate-Vit D-Min (CALCIUM 600+D3 PLUS MINERALS PO) Take 1 tablet by mouth 2 (two) times daily.     . carbidopa-levodopa (SINEMET IR) 25-100 MG per tablet Take 1.5 tablets by mouth every 3 (three) hours. 7-8 times daily    . clonazePAM (KLONOPIN) 0.5 MG tablet Take 0.25-0.5 mg by mouth 2 (two) times daily. Take 0.25 mg by mouth in am and take 0.5 mg by mouth at bedtime    . ELIQUIS 5 MG TABS tablet TAKE 1 TABLET BY MOUTH 2 TIMES DAILY (Patient taking differently: TAKE 5 mg BY MOUTH 2 TIMES DAILY) 60 tablet 6  . Multiple Vitamin (MULTIVITAMIN WITH MINERALS) TABS tablet Take 1 tablet by mouth daily.    . Ropinirole HCl 6 MG TB24 TAKE 1 TABLET BY MOUTH 2 TIMES DAILY (Patient  taking differently: TAKE 6 mg BY MOUTH 2 TIMES DAILY) 60 tablet 2  . Vilazodone HCl (VIIBRYD) 10 MG TABS TAKE 10 MG  BY MOUTH ONCE DAILY. Take with food.    . flecainide (TAMBOCOR) 100 MG tablet Take 1 tablet by mouth at onset of fast heart rates as needed 10 tablet 0  . metoprolol tartrate (LOPRESSOR) 25 MG tablet Take 1 tablet by mouth every 6 hours as need for fast heart rates only if systolic BP<100 30 tablet 0   No current facility-administered medications for this visit.     Allergies:   Lidocaine   Social History:  The patient  reports that she has never smoked. She has never used smokeless tobacco. She reports that she does not drink alcohol or use drugs.   Family  History:  The patient's  family history includes Epilepsy in her daughter; Heart attack in her mother; Hypertension in her father; Stroke in her father and sister.    ROS:  Please see the history of present illness.   All other systems are reviewed and negative.    PHYSICAL EXAM: VS:  BP (!) 84/62   Pulse 67   Ht 5\' 5"  (1.651 m)   Wt 152 lb 6.4 oz (69.1 kg)   SpO2 96%   BMI 25.36 kg/m  , BMI Body mass index is 25.36 kg/m. GEN: chronically ill, in no acute distress  HEENT: normal  Neck: no JVD, carotid bruits, or masses Cardiac: RRR; no murmurs, rubs, or gallops,no edema  Respiratory:  clear to auscultation bilaterally, normal work of breathing GI: soft, nontender, nondistended, + BS MS: diffuse atrophy, walks slowly and requires significant assistance. Skin: warm and dry  Neuro:  + tremor, Psych: euthymic mood, flat affect  EKG:  EKG is ordered today. The ekg ordered today shows sinus rhythm 64 bpm, PR 140 msec, QRS 78 msec, nonspecific ST/T changes, QTc 389 msec   Recent Labs: 11/15/2015: TSH 2.59 06/01/2016: ALT <5; BUN 11; Creatinine, Ser 0.78; Hemoglobin 13.1; Platelets 240; Potassium 3.7; Sodium 140    Lipid Panel     Component Value Date/Time   CHOL 158 01/14/2016 0757   TRIG 86 01/14/2016 0757   HDL 47 01/14/2016 0757   CHOLHDL 3.4 01/14/2016 0757   VLDL 17 01/14/2016 0757   LDLCALC 94 01/14/2016 0757     Wt Readings from Last 3 Encounters:  07/17/16 152 lb 6.4 oz (69.1 kg)  06/04/16 149 lb 6 oz (67.8 kg)  06/01/16 141 lb (64 kg)    Other studies Reviewed: Additional studies/ records that were reviewed today include: AF clinic notes, prior echo  Review of the above records today demonstrates: as above   ASSESSMENT AND PLAN:  1.  Paroxysmal atrial fibrillation The patient has symptomatic afib.  She did not tolerate amiodarone previously.  Given her advanced parkinsons, relatively infrequent afib, and overall debility, I do not recommend ablation at  this time.  Today, I have offered pill in pocket metoprolol followed by pill in pocket flecainide (only 100 mg dose) as an option.  If her afib becomes more frequent, we could consider flecainide 50mg  BID at that time. Continue anticoagulation for chads2vasc score of 2 and prior DVT.  2. OSA Compliance with treatment advised  Follow-up with me in 3 months She should call the AF clinic for any problems that arise in the interim.  Current medicines are reviewed at length with the patient today.   The patient does not have concerns regarding her  medicines.  The following changes were made today:  none  Labs/ tests ordered today include:  Orders Placed This Encounter  Procedures  . EKG 12-Lead     Signed, Hillis Range, MD  07/17/2016 12:29 PM     [facility] [address] [place] [address] [phone] (office) [phone] (fax)

## 2016-07-18 ENCOUNTER — Telehealth: Payer: Self-pay | Admitting: Cardiology

## 2016-07-18 NOTE — Telephone Encounter (Signed)
Mrs. Sharon Jenkins is calling because on yesterday her blood pressure was low and today it is running high and was feeling light headed .She took a Metoprolol . Please call

## 2016-07-18 NOTE — Telephone Encounter (Signed)
Pt was seen yesterday in Dr Hart CarwinAlred's clinic for EP evaluation. Pt's BP was 84/62. This morning her BP was 138/86 which pt said  it is too high for her, her HR was sporadic . Pt states she was felt like she was going to faint, light headed . Pt took a Metoprolol 25 my as directed for fast HR and if Systolic pressure <100.  Pt feels a lot better now. Pt's BP now is 91/57, hear rate 70 beats/minute.

## 2016-07-23 ENCOUNTER — Telehealth (HOSPITAL_COMMUNITY): Payer: Self-pay | Admitting: *Deleted

## 2016-07-23 NOTE — Telephone Encounter (Signed)
Patient called in stating she is in afib. HR is 140 BP 135/70. Pt has not taken any metoprolol. Instructed pt to take 25mg  of metoprolol now. If after hour this does not convert her to NSR she can use PIP flecainide dose 100mg . Pt verbalized understanding.

## 2016-07-23 NOTE — Progress Notes (Signed)
Left message on voicemail in reference to upcoming appointment scheduled for 07/25/16. Phone number given for a call back so details instructions can be given. Sharon Jenkins, Adelene IdlerCynthia W

## 2016-07-25 ENCOUNTER — Ambulatory Visit (HOSPITAL_COMMUNITY): Payer: Medicare Other | Attending: Cardiology

## 2016-07-25 DIAGNOSIS — R61 Generalized hyperhidrosis: Secondary | ICD-10-CM | POA: Diagnosis not present

## 2016-07-25 DIAGNOSIS — R002 Palpitations: Secondary | ICD-10-CM | POA: Insufficient documentation

## 2016-07-25 DIAGNOSIS — R0789 Other chest pain: Secondary | ICD-10-CM | POA: Insufficient documentation

## 2016-07-25 DIAGNOSIS — I2510 Atherosclerotic heart disease of native coronary artery without angina pectoris: Secondary | ICD-10-CM | POA: Diagnosis not present

## 2016-07-25 LAB — MYOCARDIAL PERFUSION IMAGING
CHL CUP NUCLEAR SDS: 3
CHL CUP RESTING HR STRESS: 59 {beats}/min
LHR: 0.29
LV dias vol: 95 mL (ref 46–106)
LV sys vol: 45 mL
Peak HR: 83 {beats}/min
SRS: 3
SSS: 6
TID: 1.13

## 2016-07-25 MED ORDER — TECHNETIUM TC 99M TETROFOSMIN IV KIT
32.7000 | PACK | Freq: Once | INTRAVENOUS | Status: AC | PRN
  Administered 2016-07-25: 32.7 via INTRAVENOUS
  Filled 2016-07-25: qty 33

## 2016-07-25 MED ORDER — REGADENOSON 0.4 MG/5ML IV SOLN
0.4000 mg | Freq: Once | INTRAVENOUS | Status: AC
  Administered 2016-07-25: 0.4 mg via INTRAVENOUS

## 2016-07-25 MED ORDER — TECHNETIUM TC 99M TETROFOSMIN IV KIT
10.5000 | PACK | Freq: Once | INTRAVENOUS | Status: AC | PRN
Start: 2016-07-25 — End: 2016-07-25
  Administered 2016-07-25: 10.5 via INTRAVENOUS
  Filled 2016-07-25: qty 11

## 2016-07-29 ENCOUNTER — Encounter (HOSPITAL_COMMUNITY): Payer: Self-pay | Admitting: *Deleted

## 2016-08-12 ENCOUNTER — Telehealth: Payer: Self-pay | Admitting: Cardiology

## 2016-08-12 NOTE — Telephone Encounter (Signed)
Pt called earlier at 1715 for elevated BP, I returned call and left a message.  No call backs.

## 2016-08-13 NOTE — Progress Notes (Signed)
This encounter was created in error - please disregard.

## 2016-09-25 ENCOUNTER — Telehealth: Payer: Self-pay | Admitting: Neurology

## 2016-09-25 NOTE — Telephone Encounter (Signed)
PD is well known to cause bladder symptoms.  If nothing was found on urology evaluation, unfortunately I have nothing else to offer for that.  She perhaps could try evaluation with her PCP.

## 2016-09-25 NOTE — Telephone Encounter (Signed)
If she has not seen urology, refer to urology.

## 2016-09-25 NOTE — Telephone Encounter (Signed)
Received after hours nursing message from yesterday at 5:45 pm. It states: "Caller states she has Parkinson's and every time she lays down to sleep and gets good and asleep she wakes up in a panic and has to urinate. Has even urinated on her clothes. States that these symptoms have been going on for 6 months. States that often her body will become rigid when she has laid down for a nap. Today she took a nap and awoke panicked and wet herself."  They told her to call her primary care office today.   Please advise if okay to defer to primary care.

## 2016-09-25 NOTE — Telephone Encounter (Signed)
VM-PT left a message that she was having some health issues/Dawn

## 2016-09-25 NOTE — Telephone Encounter (Signed)
Called and spoke with patient.  She states she saw urology and was told everything was normal.  Will call [facility] and get records.  She also states she has been having tremors that are uncontrollable.  She had called EMS about this and her blood sugar was high when she was evaluated. She states she saw PCP and doesn't have diabetes. She was told to see [place] for evaluation of her PD meds. She has an appt on 10/08/16 but states she "can't wait that long". Please advise.

## 2016-09-25 NOTE — Telephone Encounter (Signed)
Tried to get notes from [facility] but we do not have a release and they will not send. Patient will get records from them for her follow up appt. She will follow up with PCP prior.

## 2016-09-26 ENCOUNTER — Telehealth: Payer: Self-pay | Admitting: Neurology

## 2016-09-26 NOTE — Telephone Encounter (Signed)
Caller: Tkai  Urgent? yes  Reason for the call: Left foot became paralyzed and couldn't move it during exercise class. Her left she cant sit up straight, she will fall to the left side. She said then her bladder gets activated and she has to go to the bathroom. She is by her self she said. Thanks

## 2016-09-26 NOTE — Telephone Encounter (Signed)
Spoke with patient. She states no changes. This has been going on for months. I advised patient if she felt like it was an emergency she needed to seek treatment in the ER or we will see her at scheduled visit in the next couple weeks.
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
## 2016-10-04 ENCOUNTER — Telehealth: Payer: Self-pay | Admitting: Neurology

## 2016-10-04 NOTE — Telephone Encounter (Signed)
Patient made aware, again, to contact urology. She was given the contact number.

## 2016-10-04 NOTE — Telephone Encounter (Signed)
Caller: Sharon Jenkins  Urgent? Yes  Reason for the call: She states that she has been up at all hours of the night having to use the bathroom. She wanted to know could stooped posture cause her not to have control over her bladder. 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 She does not want to leave her room. She mentioned Ropinerole 6 mg medication? Thanks

## 2016-10-08 ENCOUNTER — Ambulatory Visit (INDEPENDENT_AMBULATORY_CARE_PROVIDER_SITE_OTHER): Payer: Medicare Other | Admitting: Neurology

## 2016-10-08 ENCOUNTER — Telehealth: Payer: Self-pay | Admitting: Neurology

## 2016-10-08 ENCOUNTER — Encounter: Payer: Self-pay | Admitting: Neurology

## 2016-10-08 VITALS — BP 104/60 | HR 98 | Ht 65.0 in | Wt 150.2 lb

## 2016-10-08 DIAGNOSIS — G4752 REM sleep behavior disorder: Secondary | ICD-10-CM | POA: Diagnosis not present

## 2016-10-08 DIAGNOSIS — G20 Parkinson's disease: Secondary | ICD-10-CM

## 2016-10-08 DIAGNOSIS — K117 Disturbances of salivary secretion: Secondary | ICD-10-CM | POA: Diagnosis not present

## 2016-10-08 DIAGNOSIS — F411 Generalized anxiety disorder: Secondary | ICD-10-CM | POA: Diagnosis not present

## 2016-10-08 DIAGNOSIS — R32 Unspecified urinary incontinence: Secondary | ICD-10-CM

## 2016-10-08 NOTE — Patient Instructions (Signed)
1.  Try using lemon drop candy to see if it will help with the drooling  2.  You will take carbidopa/levodopa 25/100, 2 tablets 6 times per day (you will not change the dosing time at all)  3.  You need to make an appointment with both psychiatry and the counselor.

## 2016-10-08 NOTE — Telephone Encounter (Signed)
Order faxed to [facility] to [phone] with confirmation received to change Carbidopa Levodopa 25/100 IR to 2 tablets six times daily.

## 2016-10-09 ENCOUNTER — Telehealth: Payer: Self-pay | Admitting: *Deleted

## 2016-10-09 ENCOUNTER — Ambulatory Visit: Payer: Medicare Other | Admitting: Internal Medicine

## 2016-10-09 NOTE — Telephone Encounter (Signed)
Called Marcelino Duster (caregiver) but her mailbox is full.  Called patient's daughter Lynwood Dawley) and let her know what is going on.
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
## 2016-10-09 NOTE — Telephone Encounter (Signed)
I think that she probably should at least give it a day trial.  At most she would only have taken 1.5 extra tablets yesterday as I only increased each dose by 1/2 tablet and didn't see her until day was 1/2 over.   She may have slept during dinner from going out during the day/being exhausted, etc but needs to given new dosing a little trial.  Please let pt and caregiver know (she signed release yesterday)

## 2016-10-09 NOTE — Telephone Encounter (Signed)
I spoke with patient and gave her the instructions per Dr. Arbutus Leas.  Patient agreed to another day trial.  Will call her caregiver to make her aware of what is going on.

## 2016-10-10 ENCOUNTER — Telehealth: Payer: Self-pay | Admitting: Neurology

## 2016-10-10 NOTE — Telephone Encounter (Signed)
Spoke with MICHELLE SHARPE (CARE GIVER) [phone] and made her aware.

## 2016-10-10 NOTE — Telephone Encounter (Signed)
pts caregiver (?michelle) asked me to contact patients therapist, julie whitt about getting back in.  I did.  Turns out patient has only been there 2 times in total with the last being 06/21/16.  Raynelle Fanning is happy to see the patient back but I cannot make that appt for her.  Please have them call and make that appt.  She has to be seen frequently for her to get better.

## 2016-10-11 ENCOUNTER — Telehealth: Payer: Self-pay | Admitting: Neurology

## 2016-10-11 NOTE — Telephone Encounter (Signed)
Patient states that the new mediation is making her fel like a zombie when she takes it and she is not sleeping past 1 am every night and cant not go back to sleep and she has tried different relaxation things to help her go back to sleep and it has not worked please call her back

## 2016-10-11 NOTE — Telephone Encounter (Signed)
Spoke with patient. Made aware these are not side effects of Levodopa. Did offer to switch her dosage back- but she states she did just add Zoloft which she takes in the morning. She is going to ask prescribing doctor if she can switch medication to night time and see if that helps.

## 2016-10-11 NOTE — Telephone Encounter (Signed)
Can I advise patient to go back to previous dose of Levodopa?

## 2016-10-11 NOTE — Telephone Encounter (Signed)
That is fine.  I changed it because she was complaining and she was very underdosed in the room.  It has nothing to do with her being awake in the middle of the night.

## 2016-10-14 ENCOUNTER — Telehealth: Payer: Self-pay | Admitting: Neurology

## 2016-10-14 NOTE — Telephone Encounter (Signed)
Patient called back to check on the status of the message she left today.  She is still having a lot of pain she is not sure if it is the medication or what please call her. When she was on the yoga mat she heard something . [phone]

## 2016-10-14 NOTE — Telephone Encounter (Signed)
Spoke with patient. She is now complaining of severe back pain because of bulging discs. I made her aware we do not manage this.   I also made her aware we have sent in an order to change her medication back to Carbidopa Levodopa 25/100 IR 1.5 six times daily.  She states she does not want to change her medication back- denies complaining of passing out in a bathtub and passing out in her food and contributing it to her medication change. I made her aware we have daily documentation where she has called daily complaining about medication increase. We have already sent the order for the decrease back to her original dose.

## 2016-10-14 NOTE — Telephone Encounter (Signed)
Caller: Sindi  Urgent? No  Reason for the call: Update on the medication since change in dosage. Would like to inform you. Thank you

## 2016-10-14 NOTE — Telephone Encounter (Signed)
Just this AM after I talked with her caregiver I got something from the answering service that she called over weekend and said she nearly passed out in the bathtub because of the increase in the med.  I agree that she is calling and telling us different things (wants to lower meds and then doesn't want to lower meds).  Will lower for now.

## 2016-10-14 NOTE — Telephone Encounter (Signed)
Sent an order to [facility] [employer] to go back to Carbidopa Levodopa 25/100 IR 1.5 tablets 6 times daily. This was faxed to [phone] with confirmation received.   Dr. Arbutus Leas - you can reach patient's caregiver Marcelino Duster at [phone].

## 2016-10-14 NOTE — Telephone Encounter (Signed)
Spoke with caregiver about overwhelming number of phone calls to our office, which I have previously addressed with the patient.  I absolutely WANT the patient to call here with any PD related problems or concerns but I also want her to give chance for advice to work.  For example, called because of SE due to recent med change so we changed med back to previous dosing but she didn't want to change back because she thought that it was from zoloft (but she didn't bother to call psychiatry).  Much of issue relates to anxiety and patient still not made appt with counselor.  Pts caregiver did say she will talk again to the patient.  She also asked me for RX for lift chair.  Lesly Rubenstein, will you send that please?

## 2016-11-07 ENCOUNTER — Encounter (HOSPITAL_COMMUNITY): Payer: Self-pay

## 2016-11-07 ENCOUNTER — Emergency Department (HOSPITAL_COMMUNITY)
Admission: EM | Admit: 2016-11-07 | Discharge: 2016-11-08 | Disposition: A | Discharge status: Home / Self Care | Payer: Medicare Other | Attending: Emergency Medicine | Admitting: Emergency Medicine

## 2016-11-07 ENCOUNTER — Emergency Department (HOSPITAL_COMMUNITY): Payer: Medicare Other

## 2016-11-07 DIAGNOSIS — Z7901 Long term (current) use of anticoagulants: Secondary | ICD-10-CM | POA: Diagnosis not present

## 2016-11-07 DIAGNOSIS — J45909 Unspecified asthma, uncomplicated: Secondary | ICD-10-CM | POA: Diagnosis not present

## 2016-11-07 DIAGNOSIS — Z96698 Presence of other orthopedic joint implants: Secondary | ICD-10-CM | POA: Insufficient documentation

## 2016-11-07 DIAGNOSIS — Z79899 Other long term (current) drug therapy: Secondary | ICD-10-CM | POA: Diagnosis not present

## 2016-11-07 DIAGNOSIS — R1031 Right lower quadrant pain: Secondary | ICD-10-CM | POA: Diagnosis not present

## 2016-11-07 DIAGNOSIS — R109 Unspecified abdominal pain: Secondary | ICD-10-CM | POA: Diagnosis present

## 2016-11-07 LAB — URINALYSIS, ROUTINE W REFLEX MICROSCOPIC
BACTERIA UA: NONE SEEN
BILIRUBIN URINE: NEGATIVE
GLUCOSE, UA: NEGATIVE mg/dL
HGB URINE DIPSTICK: NEGATIVE
Ketones, ur: NEGATIVE mg/dL
Nitrite: NEGATIVE
PH: 7 (ref 5.0–8.0)
Protein, ur: NEGATIVE mg/dL
SQUAMOUS EPITHELIAL / LPF: NONE SEEN
Specific Gravity, Urine: 1.009 (ref 1.005–1.030)

## 2016-11-07 LAB — COMPREHENSIVE METABOLIC PANEL
ALBUMIN: 4 g/dL (ref 3.5–5.0)
ALT: 11 U/L — AB (ref 14–54)
AST: 31 U/L (ref 15–41)
Alkaline Phosphatase: 84 U/L (ref 38–126)
Anion gap: 9 (ref 5–15)
BUN: 15 mg/dL (ref 6–20)
CHLORIDE: 103 mmol/L (ref 101–111)
CO2: 27 mmol/L (ref 22–32)
CREATININE: 0.81 mg/dL (ref 0.44–1.00)
Calcium: 9.7 mg/dL (ref 8.9–10.3)
GFR calc Af Amer: >60 mL/min (ref >60)
GFR calc non Af Amer: >60 mL/min (ref >60)
GLUCOSE: 104 mg/dL — AB (ref 65–99)
POTASSIUM: 3.9 mmol/L (ref 3.5–5.1)
Sodium: 139 mmol/L (ref 135–145)
Total Bilirubin: 0.5 mg/dL (ref 0.3–1.2)
Total Protein: 7.3 g/dL (ref 6.5–8.1)

## 2016-11-07 LAB — CBC
HEMATOCRIT: 40.8 % (ref 36.0–46.0)
Hemoglobin: 13.4 g/dL (ref 12.0–15.0)
MCH: 30.3 pg (ref 26.0–34.0)
MCHC: 32.8 g/dL (ref 30.0–36.0)
MCV: 92.3 fL (ref 78.0–100.0)
PLATELETS: 269 10*3/uL (ref 150–400)
RBC: 4.42 MIL/uL (ref 3.87–5.11)
RDW: 12.8 % (ref 11.5–15.5)
WBC: 8.3 10*3/uL (ref 4.0–10.5)

## 2016-11-07 LAB — LIPASE, BLOOD: LIPASE: 29 U/L (ref 11–51)

## 2016-11-07 MED ORDER — SODIUM CHLORIDE 0.9 % IV SOLN
INTRAVENOUS | Status: DC
Start: 2016-11-07 — End: 2016-11-08

## 2016-11-07 MED ORDER — IOPAMIDOL (ISOVUE-300) INJECTION 61%
INTRAVENOUS | Status: AC
  Administered 2016-11-07: 100 mL
  Filled 2016-11-07: qty 100

## 2016-11-07 MED ORDER — SODIUM CHLORIDE 0.9 % IV BOLUS (SEPSIS)
250.0000 mL | Freq: Once | INTRAVENOUS | Status: AC
Start: 2016-11-07 — End: 2016-11-08
  Administered 2016-11-07: 250 mL via INTRAVENOUS

## 2016-11-07 MED ORDER — ONDANSETRON HCL 4 MG/2ML IJ SOLN: 4.0000 mg | Freq: Once | INTRAMUSCULAR | Status: DC

## 2016-11-07 NOTE — ED Triage Notes (Signed)
EMS called by PCP to check on patient at home for a possible stroke.  Patient has no stroke symptoms only complaint is abdominal pain and back pain when she needs to urinate. Patient states she gets panic attacks every day around 6pm and is not sure why.  Patient is A&Ox4 and non ambulatory.

## 2016-11-08 NOTE — ED Provider Notes (Signed)
[facility]-EMERGENCY DEPT Provider Note   CSN: [id number] Arrival date & time: 11/07/16  1746     History   Chief Complaint Chief Complaint  Patient presents with  . Abdominal Pain    HPI Sharon Jenkins is a 66 y.o. female.  Patient was evaluated by EMS. EMS called by primary care provider patient was at home he was concerned of possible stroke. When EMS arrived patient had no stroke symptoms. Only complaining of abdominal pain and back pain and worse when she urinates. Associated with some nausea but no vomiting or diarrhea. Patient also told EMS that she has anxiety and she has panic attacks in the evening.      Past Medical History:  Diagnosis Date  . Anxiety   . Asthma   . Atrial fibrillation (HCC)   . Chronic bronchitis (HCC)    "I'm good for a case a year" (05/02/2015)  . Depression   . DVT (deep venous thrombosis) (HCC)    "don't remember which side"  . Parkinson disease (HCC) dx'd ~ 2009  . Sleep apnea    "I chose not to wear a mask" (05/02/2015)  . Spinal stenosis of lumbar region   . Walking pneumonia X 1    Patient Active Problem List   Diagnosis Date Noted  . UTI (urinary tract infection) 04/21/2016  . Left sided numbness 01/14/2016  . Hypotension, unspecified hypotension type 11/15/2015  . Anxiety and depression 07/04/2015  . Anticoagulated 07/04/2015  . Weakness 05/02/2015  . Parkinson disease (HCC) 05/02/2015  . Memory deficit 05/02/2015  . PAF (paroxysmal atrial fibrillation) (HCC) 05/02/2015    Past Surgical History:  Procedure Laterality Date  . ABDOMINAL HYSTERECTOMY  ~ 2006  . BACK SURGERY    . CATARACT EXTRACTION W/ INTRAOCULAR LENS  IMPLANT, BILATERAL Bilateral 1998  . DILATION AND CURETTAGE OF UTERUS  1974  . GENIOPLASTY  ~ 4098-1191   "placed screw and bone matter in my chin & brought it forward"  . KNEE ARTHROSCOPY Left    "meniscus tear"  . LUMBAR DISC ARTHROPLASTY  1991  . TUBAL LIGATION  05/1978  . VARICOSE VEIN SURGERY Right 1987     OB History    No data available       Home Medications    Prior to Admission medications   Medication Sig Start Date End Date Taking? Authorizing Provider  Artificial Tear Ointment (DRY EYES OP) Place 1 drop into both eyes 2 (two) times daily.     [provider]  busPIRone (BUSPAR) 10 MG tablet  10/04/16   [provider]  Calcium Carbonate-Vit D-Min (CALCIUM 600+D3 PLUS MINERALS PO) Take 1 tablet by mouth 2 (two) times daily.     [provider]  carbidopa-levodopa (SINEMET IR) 25-100 MG per tablet Take 1.5 tablets by mouth every 3 (three) hours. 7-8 times daily    [provider]  clonazePAM (KLONOPIN) 0.5 MG tablet Take 0.25-0.5 mg by mouth 2 (two) times daily. Take 0.25 mg by mouth in am and take 0.5 mg by mouth at bedtime    [provider]  ELIQUIS 5 MG TABS tablet TAKE 1 TABLET BY MOUTH 2 TIMES DAILY Patient taking differently: TAKE 5 mg BY MOUTH 2 TIMES DAILY 11/20/15   Newman Nip, NP  flecainide (TAMBOCOR) 100 MG tablet Take 1 tablet by mouth at onset of fast heart rates as needed 07/17/16   Allred, Fayrene Fearing, MD  metoprolol tartrate (LOPRESSOR) 25 MG tablet Take 1 tablet by mouth  every 6 hours as need for fast heart rates only if systolic BP<100 07/17/16   Allred, Fayrene FearingJames, MD  Multiple Vitamin (MULTIVITAMIN WITH MINERALS) TABS tablet Take 1 tablet by mouth daily.    [provider]  Ropinirole HCl 6 MG TB24 TAKE 1 TABLET BY MOUTH 2 TIMES DAILY Patient taking differently: TAKE 6 mg BY MOUTH 2 TIMES DAILY 03/22/16   Tat, Octaviano Battyebecca S, DO  sertraline (ZOLOFT) 25 MG tablet TAKE 3 TABLETS BY MOUTH daily FOR 1 WEEK, THEN INCREASE TO 4 TABLETS daily 08/23/16   [provider]  Vilazodone HCl (VIIBRYD) 10 MG TABS TAKE 10 MG  BY MOUTH ONCE DAILY. Take with food. 01/16/16   [provider]    Family History Family History  Problem Relation Age of Onset  . Heart attack Mother   . Hypertension Father   . Stroke Father   .  Stroke Sister   . Epilepsy Daughter     Social History Social History  Substance Use Topics  . Smoking status: Never Smoker  . Smokeless tobacco: Never Used  . Alcohol use No     Allergies   Lidocaine   Review of Systems Review of Systems  Constitutional: Negative for fever.  HENT: Negative for congestion.   Respiratory: Negative for shortness of breath.   Cardiovascular: Negative for chest pain.  Gastrointestinal: Positive for abdominal pain and nausea. Negative for diarrhea and vomiting.  Genitourinary: Positive for dysuria and frequency.  Musculoskeletal: Positive for back pain.  Skin: Negative for rash.  Neurological: Positive for tremors. Negative for headaches.  Hematological: Does not bruise/bleed easily.  Psychiatric/Behavioral: Negative for confusion. The patient is nervous/anxious.      Physical Exam Updated Vital Signs BP (!) 142/75 (BP Location: Right Arm)   Pulse 74   Temp 97.8 F (36.6 C) (Oral)   Resp 16   SpO2 100%   Physical Exam  Constitutional: She is oriented to person, place, and time. She appears well-developed and well-nourished. No distress.  HENT:  Head: Normocephalic and atraumatic.  Mouth/Throat: Oropharynx is clear and moist.  Eyes: Conjunctivae and EOM are normal. Pupils are equal, round, and reactive to light.  Neck: Normal range of motion. Neck supple.  Cardiovascular: Normal rate, regular rhythm and normal heart sounds.   Pulmonary/Chest: Effort normal and breath sounds normal. No respiratory distress.  Abdominal: Soft. Bowel sounds are normal. There is no tenderness.  Musculoskeletal: Normal range of motion. She exhibits no edema.  Neurological: She is alert and oriented to person, place, and time. No cranial nerve deficit or sensory deficit. She exhibits normal muscle tone. Coordination normal.  Somewhat of a tremor.  Skin: Skin is warm.  Psychiatric:  Patient anxious.  Nursing note and vitals reviewed.    ED Treatments /  Results  Labs (all labs ordered are listed, but only abnormal results are displayed) Labs Reviewed  COMPREHENSIVE METABOLIC PANEL - Abnormal; Notable for the following:       Result Value   Glucose, Bld 104 (*)    ALT 11 (*)    All other components within normal limits  URINALYSIS, ROUTINE W REFLEX MICROSCOPIC - Abnormal; Notable for the following:    Leukocytes, UA MODERATE (*)    All other components within normal limits  URINE CULTURE  LIPASE, BLOOD  CBC    EKG  EKG Interpretation None       Radiology Ct Abdomen Pelvis W Contrast  Result Date: 11/07/2016 CLINICAL DATA:  Right lower quadrant abdominal pain  EXAM: CT ABDOMEN AND PELVIS WITH CONTRAST TECHNIQUE: Multidetector CT imaging of the abdomen and pelvis was performed using the standard protocol following bolus administration of intravenous contrast. CONTRAST:  ISOVUE-300 IOPAMIDOL (ISOVUE-300) INJECTION 61% COMPARISON:  CT abdomen pelvis 06/27/2016 FINDINGS: Lower chest: No pulmonary nodules. No visible pleural or pericardial effusion. Hepatobiliary: Normal hepatic size and contours without focal liver lesion. No perihepatic ascites. No intra- or extrahepatic biliary dilatation. Normal gallbladder. Pancreas: Normal pancreatic contours and enhancement. No peripancreatic fluid collection or pancreatic ductal dilatation. Spleen: Normal. Adrenals/Urinary Tract: Normal adrenal glands. No hydronephrosis or solid renal mass. Duplicated left renal collecting system. Stomach/Bowel: There is no hiatal hernia. The stomach and duodenum are normal. There is no dilated small bowel or enteric inflammation. There is no colonic abnormality. The appendix is normal. Vascular/Lymphatic: Normal course and caliber of the major abdominal vessels. No abdominal or pelvic adenopathy. Reproductive: Status post hysterectomy.  No adnexal mass. Musculoskeletal: No lytic or blastic osseous lesion. Multilevel lumbar degenerative disc disease. Other: No  contributory non-categorized findings. IMPRESSION: Normal appendix.  No acute abdominal or pelvic abnormality. Electronically Signed   By: Deatra Robinson M.D.   On: 11/07/2016 20:37    Procedures Procedures (including critical care time)  Medications Ordered in ED Medications  0.9 %  sodium chloride infusion (not administered)  ondansetron (ZOFRAN) injection 4 mg (not administered)  sodium chloride 0.9 % bolus 250 mL (250 mLs Intravenous New Bag/Given 11/07/16 2029)  iopamidol (ISOVUE-300) 61 % injection (100 mLs  Contrast Given 11/07/16 2003)     Initial Impression / Assessment and Plan / ED Course  I have reviewed the triage vital signs and the nursing notes.  Pertinent labs & imaging results that were available during my care of the patient were reviewed by me and considered in my medical decision making (see chart for details).     Workup for the abdominal pain without any acute findings. CT abdomen and pelvis was negative. Urinalysis not confirmatory for urinary tract infection.  The patient without any strokelike symptoms. Just the abdominal pain and back pain when she needs to urinate. Patient also has a history of anxiety and says she has panic attacks in the evening.  Patient stable here nontoxic no acute distress. Urine culture sent. Patient stable for discharge home.  Final Clinical Impressions(s) / ED Diagnoses   Final diagnoses:  Right lower quadrant abdominal pain    New Prescriptions New Prescriptions   No medications on file     Vanetta Mulders, MD 11/08/16 [phone]

## 2016-11-08 NOTE — ED Notes (Signed)
[facility].  [facility] was contacted to transport [facility]

## 2016-11-08 NOTE — Discharge Instructions (Signed)
Workup here today for the abdominal pain without any acute findings. CT of the abdomen and pelvis was negative. Urinalysis without definitive evidence of urinary tract infection but has been sent for culture. You'll be contacted if it confirms a urinary tract infection. Make an appointment to follow-up with your doctors. Return for any new or worse symptoms.

## 2016-11-09 LAB — URINE CULTURE: Culture: <10000 — AB

## 2016-11-14 ENCOUNTER — Telehealth: Payer: Self-pay | Admitting: Neurology

## 2016-11-14 NOTE — Telephone Encounter (Signed)
Caller: Cristalle   Urgent? Yes  Reason for the call: She would like you to call her please. She said she is having Spasms every 3 Hours due to she thinks the Sinemet medication? She also was asking about the Clonazepam medication. Thanks

## 2016-11-15 MED ORDER — ENTACAPONE 200 MG PO TABS: ORAL_TABLET | ORAL | 2 refills | Status: DC

## 2016-11-15 NOTE — Telephone Encounter (Signed)
Left message on machine to make patient aware that [facility] [employer] which prolong affects of Levodopa. To call with any problems.

## 2016-11-15 NOTE — Telephone Encounter (Signed)
[facility] [employer]. Tried to call patient to make her aware and number busy.

## 2016-11-15 NOTE — Telephone Encounter (Signed)
Called patient and she describes having some off time right before she takes her Levodopa. She states she will have increased freezing and tremors a little before her Levodopa is due and it will not kick in for about an hour after she takes it. She states this happens with all her doses that she takes at 5 am, 8 am, 11 am, 2 pm, 5 pm, and 8 pm. The worst occurs around 11 am and 2 pm.  Please advise.

## 2016-11-15 NOTE — Telephone Encounter (Signed)
I would generally say take an extra 1/2 tablet but tried that last time and she didn't like that and requested that we go back to previous dosing.  If hasn't been on comtan (I don't see that), add that at 5am, 11am, 2pm.  Let us know if has diarrhea.

## 2016-11-19 ENCOUNTER — Telehealth: Payer: Self-pay | Admitting: Neurology

## 2016-11-19 NOTE — Telephone Encounter (Signed)
PT left a voicemail message regarding a prescription that [facility] but has not been done yet, she did not say what the prescription was for

## 2016-11-20 NOTE — Telephone Encounter (Signed)
Rx called in again

## 2016-11-22 IMAGING — MR MR CERVICAL SPINE W/O CM
4 of 5 series · 21 of 48 positions shown · non-contrast
Comparison: None.

CLINICAL DATA: Neck and left elbow pain for 5-6 years. No known
injury. Initial encounter.

EXAM:
MRI CERVICAL SPINE WITHOUT CONTRAST
TECHNIQUE: Multiplanar, multisequence MR imaging of the cervical spine was
performed. No intravenous contrast was administered.

[Series 3: T2 · sagittal · 3.0mm · 0.41mm/px · 7 of 13 slices shown (1 of 3)]
[im 1/13]
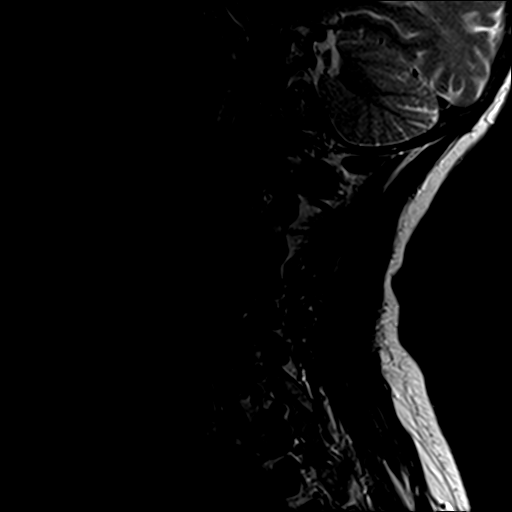
[im 3/13]
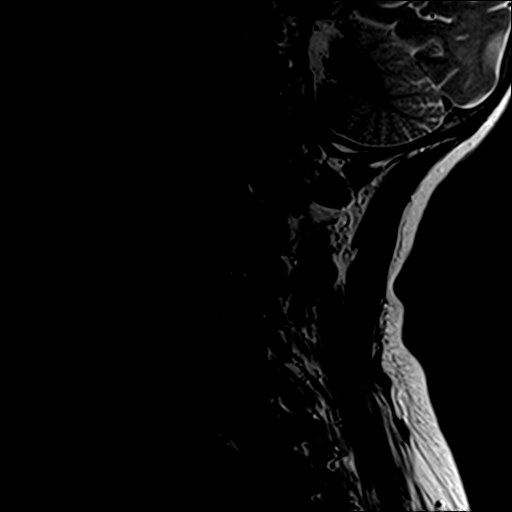
[im 5/13]
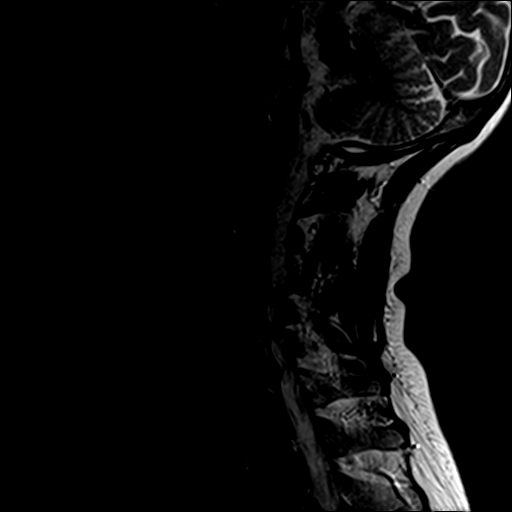
[im 7/13]
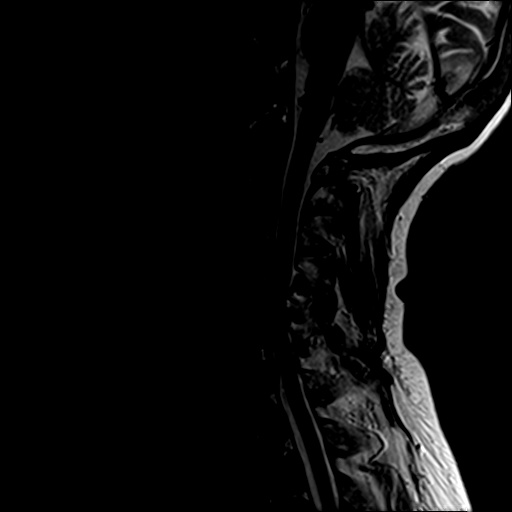
[im 9/13]
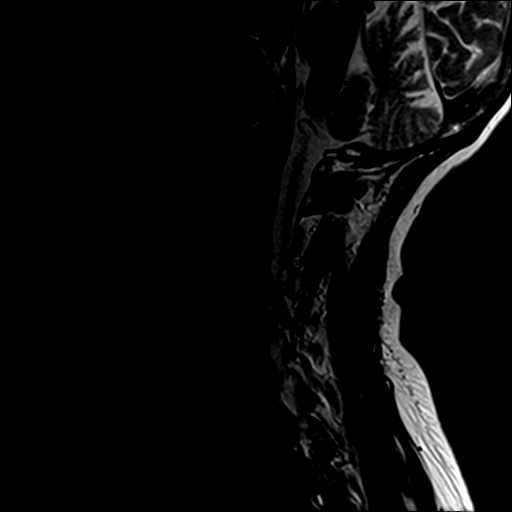
[im 11/13]
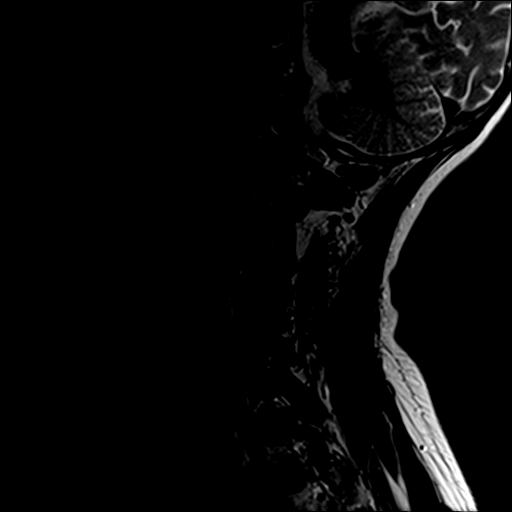
[im 13/13]
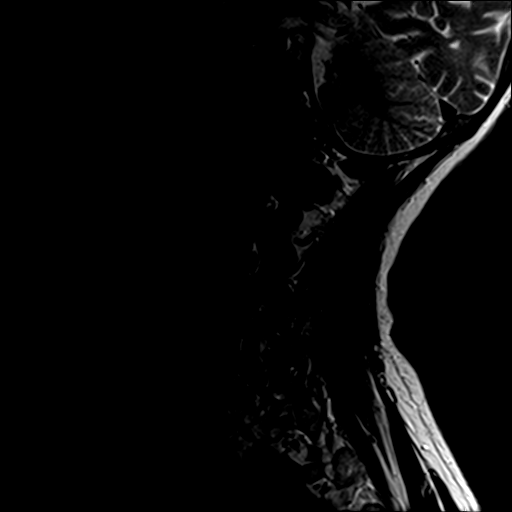

[Series 4: T1 · sagittal · 3.0mm · 0.41mm/px · 3 of 13 slices shown]
[im 3/13]
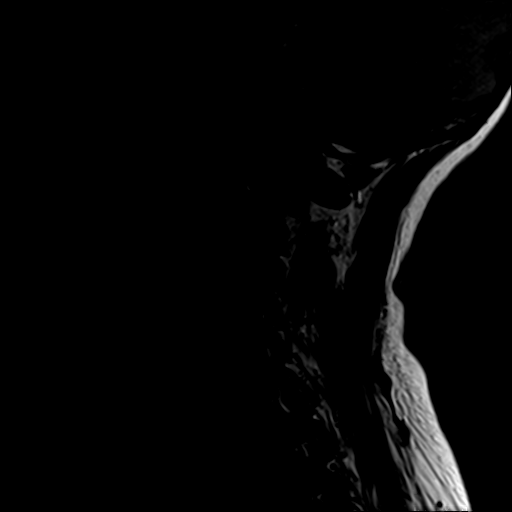
[im 7/13]
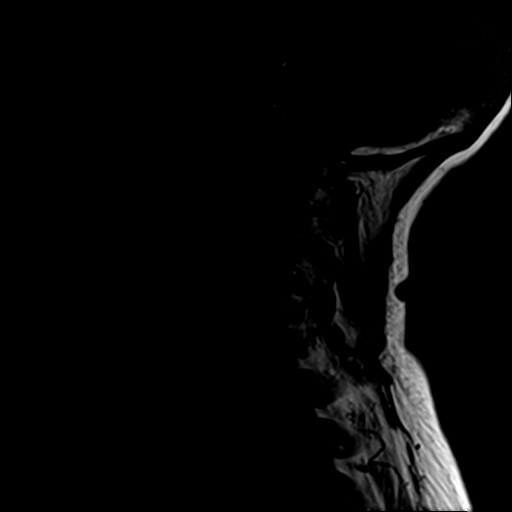
[im 11/13]
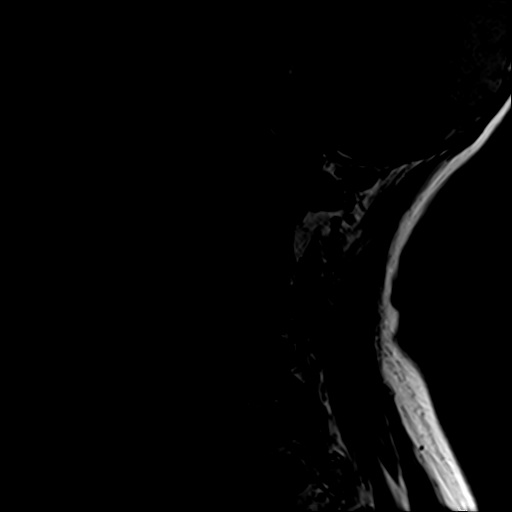

[Series 6: T2 · axial · 3.0mm · 0.39mm/px · z∈[-75,+25]mm · 8 of 29 slices shown (2 of 3)]
[im 1/29]
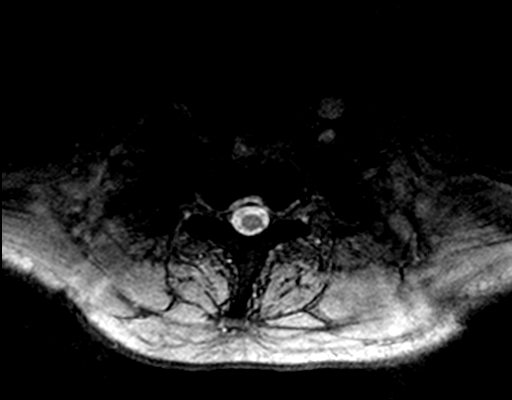
[im 5/29]
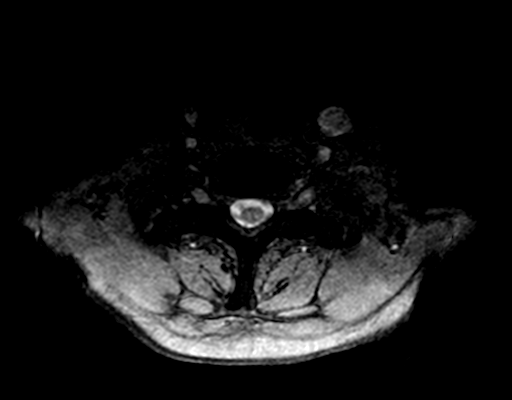
[im 9/29]
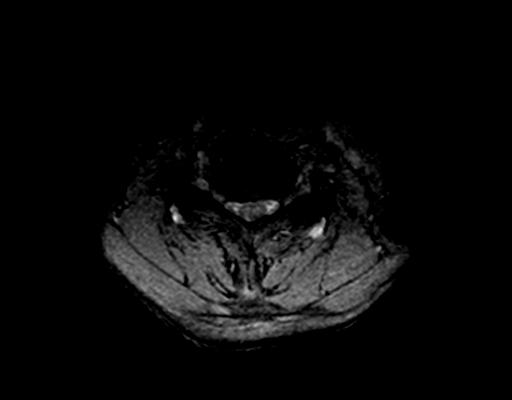
[im 13/29]
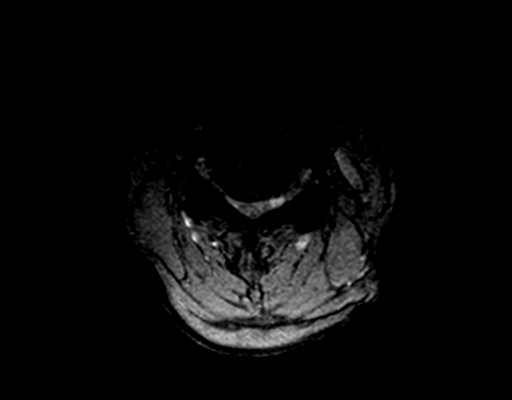
[im 16/29]
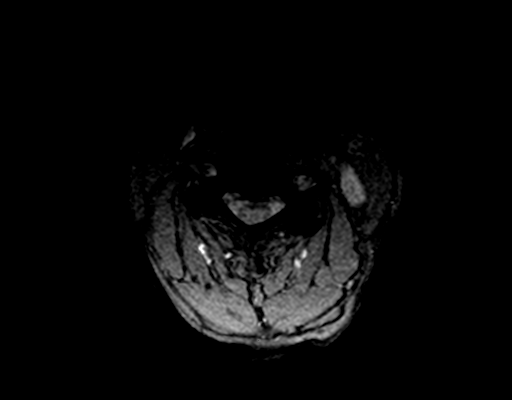
[im 20/29]
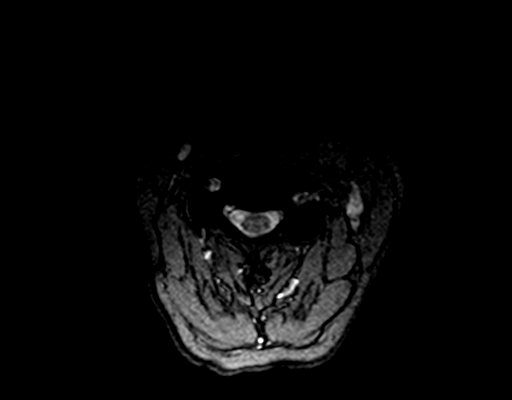
[im 24/29]
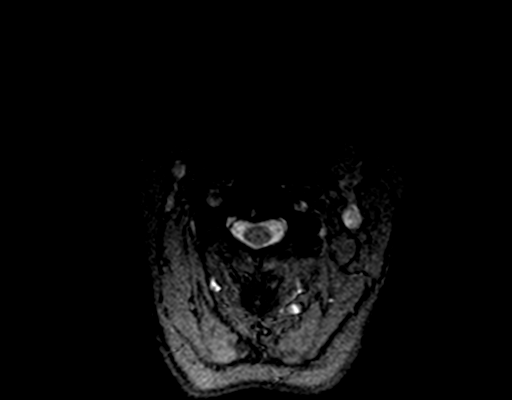
[im 29/29]
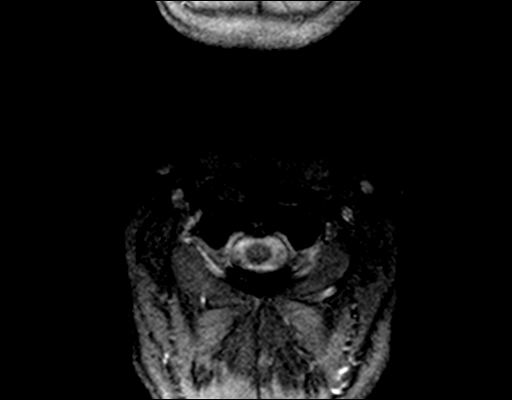

[Series 7: T2 · axial · 3.0mm · 0.39mm/px · z∈[-61,+7]mm · 3 of 29 slices shown (3 of 3)]
[im 5/29]
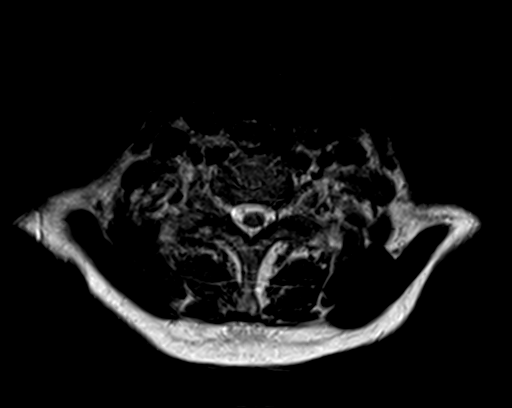
[im 16/29]
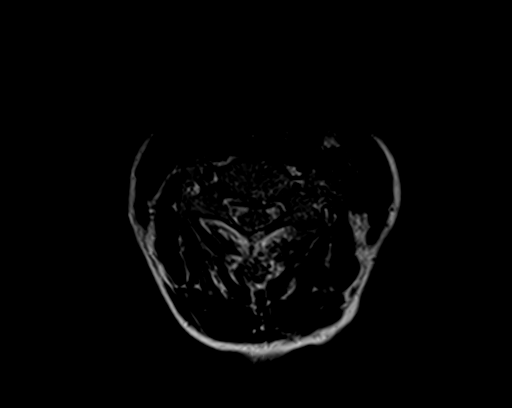
[im 24/29]
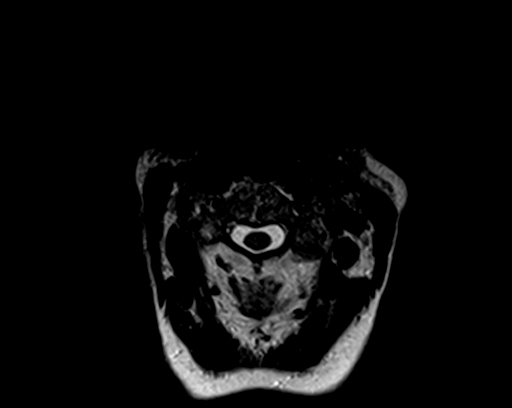

[21 of 48 positions shown; findings below may reference images not displayed]

FINDINGS: Alignment: 0.2 cm retrolisthesis C4 on C5 is identified. There is
also trace anterolisthesis C7 on T1.

Vertebrae: No fracture or worrisome marrow lesion. The patient has a
congenitally narrow central spinal canal.

Cord: Normal signal throughout.

Posterior Fossa, vertebral arteries, paraspinal tissues:
Unremarkable.

Disc levels:

C2-3:  Negative.

C3-4: Mild posterior bony ridging is seen. Facet degenerative
disease is more notable on the left. The central canal is open. Mild
to moderate foraminal narrowing is worse on the left.

C4-5: Disc osteophyte complex and uncovertebral disease are seen.
The ventral cord is flattened and there is severe bilateral
foraminal narrowing.

C5-6: Disc osteophyte complex and uncovertebral disease are seen.
There is flattening of the ventral cord. Moderately severe to severe
foraminal narrowing is worse on the right.

C6-7: Shallow disc bulge identified and there is uncovertebral
disease. The ventral thecal sac is effaced and there is moderately
severe to severe bilateral foraminal narrowing.

C7-T1:  Negative.
IMPRESSION: Congenital and acquired central canal stenosis appears worst at C4-5
where there is marked flattening of the ventral cord and severe
bilateral foraminal narrowing.

Congenital and acquired central canal stenosis at C5-6 where there
is flattening of the ventral cord and moderately severe to severe
foraminal narrowing, worse on the right.

Moderately severe to severe bilateral foraminal narrowing C6-7 where
the ventral thecal sac is effaced.

## 2016-11-25 ENCOUNTER — Telehealth: Payer: Self-pay | Admitting: Neurology

## 2016-11-25 NOTE — Telephone Encounter (Signed)
Received note from after hours clinic from 11/22/16 at 11 pm. It states, "Caller states she has Parkinson's and is starting to have problems with incontinence which is causing her to have sleepless nights. She increased Sertraline to 100 mg by her psychiatrist and still feeling anxious/nervous. She was prescribed Entacapone a week ago but has not taken it because one of the side effects stated was dizziness."   Patient was instructed to call our office if she wanted to discuss further.

## 2016-11-26 ENCOUNTER — Ambulatory Visit (INDEPENDENT_AMBULATORY_CARE_PROVIDER_SITE_OTHER): Payer: Medicare Other | Admitting: Licensed Clinical Social Worker

## 2016-11-26 DIAGNOSIS — F324 Major depressive disorder, single episode, in partial remission: Secondary | ICD-10-CM | POA: Diagnosis not present

## 2016-11-26 DIAGNOSIS — F419 Anxiety disorder, unspecified: Secondary | ICD-10-CM

## 2016-11-27 ENCOUNTER — Emergency Department (HOSPITAL_COMMUNITY): Payer: Medicare Other

## 2016-11-27 ENCOUNTER — Emergency Department (HOSPITAL_COMMUNITY)
Admission: EM | Admit: 2016-11-27 | Discharge: 2016-11-27 | Disposition: A | Discharge status: Home / Self Care | Payer: Medicare Other | Attending: Emergency Medicine | Admitting: Emergency Medicine

## 2016-11-27 ENCOUNTER — Telehealth (HOSPITAL_COMMUNITY): Payer: Self-pay | Admitting: Emergency Medicine

## 2016-11-27 ENCOUNTER — Encounter (HOSPITAL_COMMUNITY): Payer: Self-pay | Admitting: Emergency Medicine

## 2016-11-27 DIAGNOSIS — R531 Weakness: Secondary | ICD-10-CM | POA: Insufficient documentation

## 2016-11-27 DIAGNOSIS — J45909 Unspecified asthma, uncomplicated: Secondary | ICD-10-CM | POA: Insufficient documentation

## 2016-11-27 DIAGNOSIS — N390 Urinary tract infection, site not specified: Secondary | ICD-10-CM

## 2016-11-27 DIAGNOSIS — M79604 Pain in right leg: Secondary | ICD-10-CM

## 2016-11-27 DIAGNOSIS — G20 Parkinson's disease: Secondary | ICD-10-CM | POA: Diagnosis not present

## 2016-11-27 DIAGNOSIS — M25551 Pain in right hip: Secondary | ICD-10-CM

## 2016-11-27 DIAGNOSIS — R52 Pain, unspecified: Secondary | ICD-10-CM

## 2016-11-27 DIAGNOSIS — Z7901 Long term (current) use of anticoagulants: Secondary | ICD-10-CM | POA: Insufficient documentation

## 2016-11-27 DIAGNOSIS — Z79899 Other long term (current) drug therapy: Secondary | ICD-10-CM | POA: Diagnosis not present

## 2016-11-27 LAB — CBC
HEMATOCRIT: 41.5 % (ref 36.0–46.0)
Hemoglobin: 13.7 g/dL (ref 12.0–15.0)
MCH: 30.8 pg (ref 26.0–34.0)
MCHC: 33 g/dL (ref 30.0–36.0)
MCV: 93.3 fL (ref 78.0–100.0)
PLATELETS: 250 10*3/uL (ref 150–400)
RBC: 4.45 MIL/uL (ref 3.87–5.11)
RDW: 13 % (ref 11.5–15.5)
WBC: 8.3 10*3/uL (ref 4.0–10.5)

## 2016-11-27 LAB — I-STAT CHEM 8, ED
BUN: 20 mg/dL (ref 6–20)
Calcium, Ion: 1.21 mmol/L (ref 1.15–1.40)
Chloride: 104 mmol/L (ref 101–111)
Creatinine, Ser: 0.9 mg/dL (ref 0.44–1.00)
Glucose, Bld: 114 mg/dL — ABNORMAL HIGH (ref 65–99)
HEMATOCRIT: 40 % (ref 36.0–46.0)
HEMOGLOBIN: 13.6 g/dL (ref 12.0–15.0)
POTASSIUM: 3.8 mmol/L (ref 3.5–5.1)
SODIUM: 140 mmol/L (ref 135–145)
TCO2: 27 mmol/L (ref 0–100)

## 2016-11-27 LAB — RAPID URINE DRUG SCREEN, HOSP PERFORMED
AMPHETAMINES: NOT DETECTED
BARBITURATES: NOT DETECTED
BENZODIAZEPINES: NOT DETECTED
COCAINE: NOT DETECTED
Opiates: NOT DETECTED
Tetrahydrocannabinol: NOT DETECTED

## 2016-11-27 LAB — ETHANOL

## 2016-11-27 LAB — COMPREHENSIVE METABOLIC PANEL
ALT: 8 U/L — ABNORMAL LOW (ref 14–54)
AST: 22 U/L (ref 15–41)
Albumin: 4 g/dL (ref 3.5–5.0)
Alkaline Phosphatase: 71 U/L (ref 38–126)
Anion gap: 11 (ref 5–15)
BUN: 18 mg/dL (ref 6–20)
CHLORIDE: 102 mmol/L (ref 101–111)
CO2: 25 mmol/L (ref 22–32)
Calcium: 9.5 mg/dL (ref 8.9–10.3)
Creatinine, Ser: 0.79 mg/dL (ref 0.44–1.00)
GFR calc Af Amer: >60 mL/min (ref >60)
Glucose, Bld: 118 mg/dL — ABNORMAL HIGH (ref 65–99)
POTASSIUM: 3.7 mmol/L (ref 3.5–5.1)
SODIUM: 138 mmol/L (ref 135–145)
Total Bilirubin: 0.6 mg/dL (ref 0.3–1.2)
Total Protein: 7 g/dL (ref 6.5–8.1)

## 2016-11-27 LAB — URINALYSIS, ROUTINE W REFLEX MICROSCOPIC
Bilirubin Urine: NEGATIVE
Glucose, UA: NEGATIVE mg/dL
Hgb urine dipstick: NEGATIVE
Ketones, ur: 5 mg/dL — AB
Nitrite: NEGATIVE
PROTEIN: NEGATIVE mg/dL
SQUAMOUS EPITHELIAL / LPF: NONE SEEN
Specific Gravity, Urine: 1.017 (ref 1.005–1.030)
pH: 6 (ref 5.0–8.0)

## 2016-11-27 LAB — DIFFERENTIAL
BASOS ABS: 0 10*3/uL (ref 0.0–0.1)
BASOS PCT: 0 %
EOS ABS: 0.4 10*3/uL (ref 0.0–0.7)
Eosinophils Relative: 5 %
Lymphocytes Relative: 39 %
Lymphs Abs: 3.3 10*3/uL (ref 0.7–4.0)
MONOS PCT: 11 %
Monocytes Absolute: 0.9 10*3/uL (ref 0.1–1.0)
NEUTROS ABS: 3.7 10*3/uL (ref 1.7–7.7)
NEUTROS PCT: 45 %

## 2016-11-27 LAB — PROTIME-INR
INR: 1.08
PROTHROMBIN TIME: 14.1 s (ref 11.4–15.2)

## 2016-11-27 LAB — I-STAT TROPONIN, ED: TROPONIN I, POC: 0 ng/mL (ref 0.00–0.08)

## 2016-11-27 LAB — APTT: APTT: 33 s (ref 24–36)

## 2016-11-27 MED ORDER — CEPHALEXIN 250 MG PO CAPS
500.0000 mg | ORAL_CAPSULE | Freq: Once | ORAL | Status: AC
  Administered 2016-11-27: 500 mg via ORAL
  Filled 2016-11-27: qty 2

## 2016-11-27 MED ORDER — ACETAMINOPHEN 325 MG PO TABS
650.0000 mg | ORAL_TABLET | Freq: Once | ORAL | Status: AC
  Administered 2016-11-27: 650 mg via ORAL
  Filled 2016-11-27: qty 2

## 2016-11-27 MED ORDER — CEPHALEXIN 500 MG PO CAPS: 500.0000 mg | ORAL_CAPSULE | Freq: Three times a day (TID) | ORAL | 0 refills | Status: DC

## 2016-11-27 MED ORDER — DIAZEPAM 2 MG PO TABS
2.0000 mg | ORAL_TABLET | Freq: Once | ORAL | Status: AC
  Administered 2016-11-27: 2 mg via ORAL
  Filled 2016-11-27: qty 1

## 2016-11-27 NOTE — ED Notes (Signed)
Repositioned patient in bed multiple times so that she could find a position of comfort. Patient stated that if we propped her R leg up, she could get function back. Patient also requesting her medications that she gets at home, such as sertraline and buspar. MD made aware.

## 2016-11-27 NOTE — ED Provider Notes (Signed)
[facility]EMERGENCY DEPT Provider Note   CSN: [id number] Arrival date & time: 11/27/16  0018  By signing my name below, I, [name], attest that this documentation has been prepared under the direction and in the presence of Sharon Jenkins. Electronically Signed: [name], ED Scribe. 11/27/16. 12:53 AM.  History   Chief Complaint Chief Complaint  Patient presents with  . Hip Pain   The history is provided by the patient. No language interpreter was used.    HPI Comments: Sharon Jenkins is a 66 y.o. female BIB EMS from [place], with a PMHx of AFIB, Parkinson's disease, PAF, and prior DVT, who presents to the Emergency Department complaining of acute onset, persistent right hip and leg pain beginning tonight prior to arrival. She rates her current pain as 10/10. Per pt, she went to bed five hours ago at [facility] and she woke up several hours later with her right hip pain. Pt notes that prior to going to bed she was otherwise asymptomatic and not experiencing pain.He also reports weakness. Unclear whether this is secondary to pain. No recent trauma, injury, or falls. She also endorses some weakness to the right hip and leg as well. Her pain is worse with movement of the extremity. No noted treatments for her pain were tried prior to coming into the ED. Pt is currently on Eliquis therapy. She denies chest pain, shortness of breath, adominal pain, or any other associated symptoms. Patient states "help me I'm having a stroke." On several occasions she also states that she thinks her pain is related to "my urine." Denies dysuria and will not elaborate.  Past Medical History:  Diagnosis Date  . Anxiety   . Asthma   . Atrial fibrillation (HCC)   . Chronic bronchitis (HCC)    "I'm good for a case a year" (05/02/2015)  . Depression   . DVT (deep venous thrombosis) (HCC)    "don't remember which side"  . Parkinson disease (HCC) dx'd ~ 2009  . Sleep apnea    "I chose not to  wear a mask" (05/02/2015)  . Spinal stenosis of lumbar region   . Walking pneumonia X 1   Patient Active Problem List   Diagnosis Date Noted  . UTI (urinary tract infection) 04/21/2016  . Left sided numbness 01/14/2016  . Hypotension, unspecified hypotension type 11/15/2015  . Anxiety and depression 07/04/2015  . Anticoagulated 07/04/2015  . Weakness 05/02/2015  . Parkinson disease (HCC) 05/02/2015  . Memory deficit 05/02/2015  . PAF (paroxysmal atrial fibrillation) (HCC) 05/02/2015   Past Surgical History:  Procedure Laterality Date  . ABDOMINAL HYSTERECTOMY  ~ 2006  . BACK SURGERY    . CATARACT EXTRACTION W/ INTRAOCULAR LENS  IMPLANT, BILATERAL Bilateral 1998  . DILATION AND CURETTAGE OF UTERUS  1974  . GENIOPLASTY  ~ 4098-1191   "placed screw and bone matter in my chin & brought it forward"  . KNEE ARTHROSCOPY Left    "meniscus tear"  . LUMBAR DISC ARTHROPLASTY  1991  . TUBAL LIGATION  05/1978  . VARICOSE VEIN SURGERY Right 1987   OB History    No data available     Home Medications    Prior to Admission medications   Medication Sig Start Date End Date Taking? Authorizing Provider  Artificial Tear Ointment (DRY EYES OP) Place 1 drop into both eyes 2 (two) times daily.     Provider, Historical, Jenkins  busPIRone (BUSPAR) 10 MG tablet  10/04/16   Provider,  Historical, Jenkins  Calcium Carbonate-Vit D-Min (CALCIUM 600+D3 PLUS MINERALS PO) Take 1 tablet by mouth 2 (two) times daily.     Provider, Historical, Jenkins  carbidopa-levodopa (SINEMET IR) 25-100 MG per tablet Take 1.5 tablets by mouth every 3 (three) hours. 7-8 times daily    Provider, Historical, Jenkins  cephALEXin (KEFLEX) 500 MG capsule Take 1 capsule (500 mg total) by mouth 3 (three) times daily. 11/27/16   Sharon Jenkins  clonazePAM (KLONOPIN) 0.5 MG tablet Take 0.25-0.5 mg by mouth 2 (two) times daily. Take 0.25 mg by mouth in am and take 0.5 mg by mouth at bedtime    Provider, Historical, Jenkins  ELIQUIS 5 MG TABS tablet  TAKE 1 TABLET BY MOUTH 2 TIMES DAILY Patient taking differently: TAKE 5 mg BY MOUTH 2 TIMES DAILY 11/20/15   Newman Nip, NP  entacapone (COMTAN) 200 MG tablet 1 tablet at 5 am, 1 at 11 am, 1 at 2 pm 11/15/16   Tat, Octaviano Batty, DO  flecainide (TAMBOCOR) 100 MG tablet Take 1 tablet by mouth at onset of fast heart rates as needed 07/17/16   Allred, Fayrene Fearing, Jenkins  metoprolol tartrate (LOPRESSOR) 25 MG tablet Take 1 tablet by mouth every 6 hours as need for fast heart rates only if systolic BP<100 07/17/16   Allred, Fayrene Fearing, Jenkins  Multiple Vitamin (MULTIVITAMIN WITH MINERALS) TABS tablet Take 1 tablet by mouth daily.    Provider, Historical, Jenkins  Ropinirole HCl 6 MG TB24 TAKE 1 TABLET BY MOUTH 2 TIMES DAILY Patient taking differently: TAKE 6 mg BY MOUTH 2 TIMES DAILY 03/22/16   Tat, Octaviano Batty, DO  sertraline (ZOLOFT) 25 MG tablet TAKE 3 TABLETS BY MOUTH daily FOR 1 WEEK, THEN INCREASE TO 4 TABLETS daily 08/23/16   Provider, Historical, Jenkins  Vilazodone HCl (VIIBRYD) 10 MG TABS TAKE 10 MG  BY MOUTH ONCE DAILY. Take with food. 01/16/16   Provider, Historical, Jenkins   Family History Family History  Problem Relation Age of Onset  . Heart attack Mother   . Hypertension Father   . Stroke Father   . Stroke Sister   . Epilepsy Daughter    Social History Social History  Substance Use Topics  . Smoking status: Never Smoker  . Smokeless tobacco: Never Used  . Alcohol use No   Allergies   Lidocaine  Review of Systems Review of Systems  Constitutional: Negative for fever.  Respiratory: Negative for chest tightness and shortness of breath.   Cardiovascular: Negative for chest pain.  Musculoskeletal: Positive for arthralgias and myalgias.  Neurological: Positive for weakness.  All other systems reviewed and are negative.  Physical Exam Updated Vital Signs BP (!) 156/85   Pulse 96   Temp 97.8 F (36.6 C)   Resp 16   SpO2 99%   Physical Exam  Constitutional: She is oriented to person, place, and time.    Chronically ill-appearing, resting tremor  HENT:  Head: Normocephalic and atraumatic.  Eyes: Pupils are equal, round, and reactive to light.  Cardiovascular: Normal rate, regular rhythm and normal heart sounds.   No murmur heard. Pulmonary/Chest: Effort normal and breath sounds normal. No respiratory distress. She has no wheezes.  Abdominal: Soft. Bowel sounds are normal. There is no tenderness.  Musculoskeletal:  Limited active range of motion secondary to pain, normal passive range of motion of the hip and knee on the right, tenderness palpation of her right hip  Neurological: She is alert and oriented to person, place, and time.  Cranial nerves intact, alert and oriented, patient will not wiggle toes on the right, she also dropped her right hand when asked to keep the air  Skin: Skin is warm and dry.  Psychiatric: She has a normal mood and affect.  Nursing note and vitals reviewed.  ED Treatments / Results  DIAGNOSTIC STUDIES: Oxygen Saturation is 99% on RA, normal by my interpretation.   COORDINATION OF CARE: 12:52 AM-Discussed next steps with pt. Pt verbalized understanding and is agreeable with the plan.   Labs (all labs ordered are listed, but only abnormal results are displayed) Labs Reviewed  COMPREHENSIVE METABOLIC PANEL - Abnormal; Notable for the following:       Result Value   Glucose, Bld 118 (*)    ALT 8 (*)    All other components within normal limits  URINALYSIS, ROUTINE W REFLEX MICROSCOPIC - Abnormal; Notable for the following:    Ketones, ur 5 (*)    Leukocytes, UA MODERATE (*)    Bacteria, UA RARE (*)    All other components within normal limits  I-STAT CHEM 8, ED - Abnormal; Notable for the following:    Glucose, Bld 114 (*)    All other components within normal limits  URINE CULTURE  ETHANOL  PROTIME-INR  APTT  CBC  DIFFERENTIAL  RAPID URINE DRUG SCREEN, HOSP PERFORMED  I-STAT TROPOININ, ED    EKG  EKG  Interpretation  Date/Time:  Wednesday November 27 2016 01:52:30 EDT Ventricular Rate:  90 PR Interval:    QRS Duration: 95 QT Interval:  336 QTC Calculation: 412 R Axis:   18 Text Interpretation:  Needs repeat, rhythm difficult to determine secondary to artifact ST elevation, consider lateral injury Artifact in lead(s) I II III aVR aVL aVF V1 V2 V6 Confirmed by Ross Marcus ([id number]) on 11/27/2016 2:16:38 AM      Radiology Dg Hip Unilat With Pelvis 2-3 Views Right  Result Date: 11/27/2016 CLINICAL DATA:  Pain in the right hip EXAM: DG HIP (WITH OR WITHOUT PELVIS) 2-3V RIGHT COMPARISON:  CT 11/07/2016 FINDINGS: SI joints are patent. Pubic symphysis and rami are intact. No fracture or malalignment. Minimal arthritis of the right hip. IMPRESSION: No acute osseous abnormality. Minimal degenerative change of the right hip Electronically Signed   By: Jasmine Pang M.D.   On: 11/27/2016 03:07   Ct Head Code Stroke Wo Contrast  Result Date: 11/27/2016 CLINICAL DATA:  Code stroke. Bilateral lower extremity weakness, now with RIGHT-sided weakness. History of Parkinson's disease, atrial fibrillation and anti coagulation. EXAM: CT HEAD WITHOUT CONTRAST TECHNIQUE: Contiguous axial images were obtained from the base of the skull through the vertex without intravenous contrast. COMPARISON:  CT HEAD April 21, 2016 and MRI of the head March 09, 2016 FINDINGS: BRAIN: No intraparenchymal hemorrhage, mass effect nor midline shift. Mildly enlarged third ventricle, without hydrocephalus. Disproportionate midbrain volume loss. Minimal supratentorial white matter hypodensities less than expected for patient's age, though non-specific are most compatible with chronic small vessel ischemic disease. No acute large vascular territory infarcts. No abnormal extra-axial fluid collections. Basal cisterns are patent. VASCULAR: Mild calcific atherosclerosis of the carotid siphons. SKULL: No skull fracture. Severe RIGHT  temporomandibular scratches severe bilateral temporomandibular osteoarthrosis. No significant scalp soft tissue swelling. SINUSES/ORBITS: Atretic RIGHT maxillary sinus compatible chronic sinusitis with mild pan paranasal sinus mucosal thickening. Trace RIGHT mastoid effusion. The included ocular globes and orbital contents are non-suspicious. Status post bilateral ocular lens implants. OTHER: None. ASPECTS [facility] Stroke Program Early CT Score) - Ganglionic  level infarction (caudate, lentiform nuclei, internal capsule, insula, M1-M3 cortex): 7 - Supraganglionic infarction (M4-M6 cortex): 3 Total score (0-10 with 10 being normal): 10 IMPRESSION: 1. No acute intracranial process. Stable examination including advanced midbrain volume loss, nonspecific though associated with neurodegenerative disorder. 2. ASPECTS is 10. Critical Value/emergent results were called by telephone at the time of interpretation on 11/27/2016 at 1:49 am to Dr. Roseanne RenoStewart, Neurology, who verbally acknowledged these results. Electronically Signed   By: Awilda Metroourtnay  Bloomer M.D.   On: 11/27/2016 01:53    Procedures Procedures   Medications Ordered in ED Medications  diazepam (VALIUM) tablet 2 mg (2 mg Oral Given 11/27/16 0206)  acetaminophen (TYLENOL) tablet 650 mg (650 mg Oral Given 11/27/16 0254)  cephALEXin (KEFLEX) capsule 500 mg (500 mg Oral Given 11/27/16 0353)    Initial Impression / Assessment and Plan / ED Course  I have reviewed the triage vital signs and the nursing notes.  Pertinent labs & imaging results that were available during my care of the patient were reviewed by me and considered in my medical decision making (see chart for details).     Patient presents with initial right hip pain. Also reports generalized weakness. On exam will not hold up her right arm. It is unclear whether secondary to pain or true weakness. Her exam is limited as she has increased tone from her Parkinson's disease. Given she is within the  stroke window, code stroke was initiated. However, she is not a TPA candidate because she is on Eliquis.  My suspicion for stroke is low and feel she is likely having pain on the side. She was evaluated by neurology and he agrees. Code stroke was canceled. Initially patient was refusing hip x-ray. However, convinced her that she needed this for full evaluation. Lab work is largely reassuring. She does have evidence of urinary tract infection and while she was not able to elaborate on specific symptoms, did mention in her history that she felt her hip pain was related to her urine. Will treat.  After history, exam, and medical workup I feel the patient has been appropriately medically screened and is safe for discharge home. Pertinent diagnoses were discussed with the patient. Patient was given return precautions.   Final Clinical Impressions(s) / ED Diagnoses   Final diagnoses:  Right sided weakness  Right hip pain  Lower urinary tract infectious disease   New Prescriptions New Prescriptions   CEPHALEXIN (KEFLEX) 500 MG CAPSULE    Take 1 capsule (500 mg total) by mouth 3 (three) times daily.   I personally performed the services described in this documentation, which was scribed in my presence. The recorded information has been reviewed and is accurate.     Shon BatonHorton, Courtney F, Jenkins 11/27/16 [id number]

## 2016-11-27 NOTE — ED Notes (Signed)
Patient transported to CT 2

## 2016-11-27 NOTE — ED Triage Notes (Signed)
Per [facility]: Pt to ED from [facility] facility for leg and hip pain. Initially upon EMS arrival, pt stated she was unable to move both legs, but moved them when assisted onto EMS stretcher. Pt also endorses neck pain, stating it was like that after she woke up from her nap. Hx Parkinson's. Pt able to lift and hold up L leg upon arrival to ED, but unable to continue holding up R leg because of pain in her hip. She then states she has spasms in her neck and R leg. Pt unable to move neck side to side on own. Pt normally ambulatory with walker, unable to bear weight today. Pt reports knee-up position on her back is most comfortable. No redness/swelling or obvious deformity noted on either hip, nor leg. Per EMS, nursing staff stated that pt had no fall or injury. Pt A&O per baseline, acting appropriately according to baseline as well. EMS VS: 126/72, HR 90 strong, regular, CBG 95.

## 2016-11-27 NOTE — ED Notes (Signed)
Patient refused x-ray of her hip - stated, "nothing is wrong with my hip... If you get me off my hip, the function will return." When RN informed patient that MD wants an x-ray because patient initially c/o hip pain and that she can't move her right leg, patient stated that she couldn't move it because she'd been laying on it, continuing to repeat, "nothing is wrong with my hip."

## 2016-11-27 NOTE — Consult Note (Signed)
Admission H&P    Chief Complaint: Decreased movement of right side.  HPI: Sharon Jenkins is an 66 y.o. female history of Parkinson's disease, sleep apnea, lumbar spinal stenosis, deep vein thrombosis and atrial fibrillation on anticoagulation, brought to the ED with a complaint of pain involving her right hip. Patient was not moving her right leg and at one point appeared to have difficulty with movement of her right arm. There was no change in speech and no facial droop. Onset of her symptoms was at 8:00 PM. CT scan of her head was obtained which showed no acute intracranial abnormality. Initial neuro exam in the ED showed normal strength and range of motion of right upper extremity and minimal movement of right lower extremity due to right hip pain. Patient denies falling. Code stroke was, but was subsequently canceled. NIH stroke score was 3 with minimal detectable movement of right lower extremity and severe pain on attempting voluntary movement.  Past Medical History:  Diagnosis Date  . Anxiety   . Asthma   . Atrial fibrillation ([place])   . Chronic bronchitis ([place])    "I'm good for a case a year" (05/02/2015)  . Depression   . DVT (deep venous thrombosis) ([place])    "don't remember which side"  . Parkinson disease ([place]) dx'd ~ 2009  . Sleep apnea    "I chose not to wear a mask" (05/02/2015)  . Spinal stenosis of lumbar region   . Walking pneumonia X 1    Past Surgical History:  Procedure Laterality Date  . ABDOMINAL HYSTERECTOMY  ~ 2006  . BACK SURGERY    . CATARACT EXTRACTION W/ INTRAOCULAR LENS  IMPLANT, BILATERAL Bilateral 1998  . DILATION AND CURETTAGE OF UTERUS  1974  . GENIOPLASTY  ~ 0037-0488   "placed screw and bone matter in my chin & brought it forward"  . KNEE ARTHROSCOPY Left    "meniscus tear"  . [place] ARTHROPLASTY  1991  . TUBAL LIGATION  05/1978  . VARICOSE VEIN SURGERY Right 1987    Family History  Problem Relation Age of Onset  . Heart attack Mother   .  Hypertension Father   . Stroke Father   . Stroke Sister   . Epilepsy Daughter    Social History:  reports that she has never smoked. She has never used smokeless tobacco. She reports that she does not drink alcohol or use drugs.  Allergies:  Allergies  Allergen Reactions  . Lidocaine Hives    Medications: Preadmission medications were reviewed by me.  Physical Examination: Blood pressure (!) 162/96, pulse 88, temperature 98.1 F (36.7 C), temperature source Temporal, resp. rate 18, SpO2 99 %.  HEENT-  Normocephalic, no lesions, without obvious abnormality.  Normal external eye and conjunctiva.  Normal TM's bilaterally.  Normal auditory canals and external ears. Normal external nose, mucus membranes and septum.  Normal pharynx. Neck supple with no masses, nodes, nodules or enlargement. Cardiovascular - regular rate and rhythm, S1, S2 normal, no murmur, click, rub or gallop Lungs - chest clear, no wheezing, rales, normal symmetric air entry Abdomen - soft, non-tender; bowel sounds normal; no masses,  no organomegaly Extremities - no joint deformities, effusion, or inflammation  Neurologic Examination: Mental Status: Alert, oriented to current age and mouth, complaining of severe pain involving right hip.  Speech fluent without evidence of aphasia. Able to follow commands without difficulty. Cranial Nerves: II-Visual fields were normal. III/IV/VI-Pupils were equal and reacted normally to light. Extraocular movements were full  and conjugate.    V/VII-no facial numbness and no facial weakness. VIII-normal. X-normal speech.. Motor: 5/5 bilaterally except for minimal movement of right lower extremity with guarding. Sensory: Normal throughout. Deep Tendon Reflexes: 2+ and symmetric. Plantars: Flexor bilaterally Carotid auscultation: Normal  Results for orders placed or performed during the hospital encounter of 11/27/16 (from the past 48 hour(s))  Ethanol     Status: None    Collection Time: 11/27/16  1:07 AM  Result Value Ref Range   Alcohol, Ethyl (B) <5 <5 mg/dL    Comment:        LOWEST DETECTABLE LIMIT FOR SERUM ALCOHOL IS 5 mg/dL FOR MEDICAL PURPOSES ONLY   Protime-INR     Status: None   Collection Time: 11/27/16  1:07 AM  Result Value Ref Range   Prothrombin Time 14.1 11.4 - 15.2 seconds   INR 1.08   APTT     Status: None   Collection Time: 11/27/16  1:07 AM  Result Value Ref Range   aPTT 33 24 - 36 seconds  CBC     Status: None   Collection Time: 11/27/16  1:07 AM  Result Value Ref Range   WBC 8.3 4.0 - 10.5 K/uL   RBC 4.45 3.87 - 5.11 MIL/uL   Hemoglobin 13.7 12.0 - 15.0 g/dL   HCT 41.5 36.0 - 46.0 %   MCV 93.3 78.0 - 100.0 fL   MCH 30.8 26.0 - 34.0 pg   MCHC 33.0 30.0 - 36.0 g/dL   RDW 13.0 11.5 - 15.5 %   Platelets 250 150 - 400 K/uL  Differential     Status: None   Collection Time: 11/27/16  1:07 AM  Result Value Ref Range   Neutrophils Relative % 45 %   Neutro Abs 3.7 1.7 - 7.7 K/uL   Lymphocytes Relative 39 %   Lymphs Abs 3.3 0.7 - 4.0 K/uL   Monocytes Relative 11 %   Monocytes Absolute 0.9 0.1 - 1.0 K/uL   Eosinophils Relative 5 %   Eosinophils Absolute 0.4 0.0 - 0.7 K/uL   Basophils Relative 0 %   Basophils Absolute 0.0 0.0 - 0.1 K/uL  Comprehensive metabolic panel     Status: Abnormal   Collection Time: 11/27/16  1:07 AM  Result Value Ref Range   Sodium 138 135 - 145 mmol/L   Potassium 3.7 3.5 - 5.1 mmol/L   Chloride 102 101 - 111 mmol/L   CO2 25 22 - 32 mmol/L   Glucose, Bld 118 (H) 65 - 99 mg/dL   BUN 18 6 - 20 mg/dL   Creatinine, Ser 0.79 0.44 - 1.00 mg/dL   Calcium 9.5 8.9 - 10.3 mg/dL   Total Protein 7.0 6.5 - 8.1 g/dL   Albumin 4.0 3.5 - 5.0 g/dL   AST 22 15 - 41 U/L   ALT 8 (L) 14 - 54 U/L   Alkaline Phosphatase 71 38 - 126 U/L   Total Bilirubin 0.6 0.3 - 1.2 mg/dL   GFR calc non Af Amer >60 >60 mL/min   GFR calc Af Amer >60 >60 mL/min    Comment: (NOTE) The eGFR has been calculated using the CKD EPI  equation. This calculation has not been validated in all clinical situations. eGFR's persistently <60 mL/min signify possible Chronic Kidney Disease.    Anion gap 11 5 - 15  Urinalysis, Routine w reflex microscopic     Status: Abnormal   Collection Time: 11/27/16  1:08 AM  Result Value Ref Range  Color, Urine YELLOW YELLOW   APPearance CLEAR CLEAR   Specific Gravity, Urine 1.017 1.005 - 1.030   pH 6.0 5.0 - 8.0   Glucose, UA NEGATIVE NEGATIVE mg/dL   Hgb urine dipstick NEGATIVE NEGATIVE   Bilirubin Urine NEGATIVE NEGATIVE   Ketones, ur 5 (A) NEGATIVE mg/dL   Protein, ur NEGATIVE NEGATIVE mg/dL   Nitrite NEGATIVE NEGATIVE   Leukocytes, UA MODERATE (A) NEGATIVE   RBC / HPF 0-5 0 - 5 RBC/hpf   WBC, UA TOO NUMEROUS TO COUNT 0 - 5 WBC/hpf   Bacteria, UA RARE (A) NONE SEEN   Squamous Epithelial / LPF NONE SEEN NONE SEEN  I-stat troponin, ED (not at [facility], [facility])     Status: None   Collection Time: 11/27/16  1:10 AM  Result Value Ref Range   Troponin i, poc 0.00 0.00 - 0.08 ng/mL   Comment 3            Comment: Due to the release kinetics of cTnI, a negative result within the first hours of the onset of symptoms does not rule out myocardial infarction with certainty. If myocardial infarction is still suspected, repeat the test at appropriate intervals.   I-Stat Chem 8, ED  (not at [facility], [facility])     Status: Abnormal   Collection Time: 11/27/16  1:12 AM  Result Value Ref Range   Sodium 140 135 - 145 mmol/L   Potassium 3.8 3.5 - 5.1 mmol/L   Chloride 104 101 - 111 mmol/L   BUN 20 6 - 20 mg/dL   Creatinine, Ser 0.90 0.44 - 1.00 mg/dL   Glucose, Bld 114 (H) 65 - 99 mg/dL   Calcium, Ion 1.21 1.15 - 1.40 mmol/L   TCO2 27 0 - 100 mmol/L   Hemoglobin 13.6 12.0 - 15.0 g/dL   HCT 40.0 36.0 - 46.0 %   No results found.  Assessment/Plan 66 year old lady with Parkinson's disease as well as A. fib and DVT on anticoagulation, presenting with severe pain involving right hip and minimal  movement of right lower extremity due to worsening of pain with movement. She has no clinical signs of an acute stroke.  No further neurological intervention is indicated acutely, including no further neurodiagnostic studies. Recommend continued outpatient neurology intervention for management of Parkinson's disease.  C.R. Nicole Kindred, MD [facility] [phone]  11/27/2016, 1:51 AM

## 2016-11-27 NOTE — Code Documentation (Signed)
Code Stroke called on patient already in ED @ 0058.  Pt arrived to ED at 0118 c/o R hip pain.  NIH-3 for no effort against gravity in R leg. Glucose-114. CT negative for acute changes.  Code stroke cancelled at 0124.

## 2016-11-29 ENCOUNTER — Telehealth: Payer: Self-pay | Admitting: Neurology

## 2016-11-29 LAB — URINE CULTURE

## 2016-11-29 NOTE — Telephone Encounter (Signed)
Patient states she took Comtan for the first time this afternoon and is now having difficulty talking and increased tremor. Made her aware this would be an odd reaction for Comtan but advised her to go ahead and stop medication.

## 2016-11-29 NOTE — Telephone Encounter (Signed)
That isn't related to the comtan.  Agree with having her d/c.  We can retry in the future if/when she c/o sx again

## 2016-11-29 NOTE — Telephone Encounter (Signed)
PT called and said she is having a reaction to the Comtan and needs a call back

## 2016-12-24 ENCOUNTER — Ambulatory Visit: Payer: Medicare Other | Admitting: Licensed Clinical Social Worker

## 2017-01-07 ENCOUNTER — Ambulatory Visit (INDEPENDENT_AMBULATORY_CARE_PROVIDER_SITE_OTHER): Payer: Medicare Other | Admitting: Licensed Clinical Social Worker

## 2017-01-07 DIAGNOSIS — F324 Major depressive disorder, single episode, in partial remission: Secondary | ICD-10-CM | POA: Diagnosis not present

## 2017-01-21 ENCOUNTER — Ambulatory Visit: Payer: Medicare Other | Admitting: Licensed Clinical Social Worker

## 2017-01-30 ENCOUNTER — Telehealth: Payer: Self-pay | Admitting: Neurology

## 2017-01-30 NOTE — Telephone Encounter (Signed)
She is planning to move to [place] weekend. Thanks

## 2017-01-30 NOTE — Telephone Encounter (Signed)
[facility]. He said that her Parkinson's was out of control and needed to be seen soon. She also mentioned medication as well.  Please Advise.

## 2017-01-30 NOTE — Telephone Encounter (Signed)
Left message on machine for patient to call back.

## 2017-01-31 NOTE — Telephone Encounter (Signed)
Patient called back with multiple complaints.   Started talking about feeling fuzzy headed with new psychiatric medications (Buspar BID and Clonazepam BID) I told her to direct any questions re: those new psychiatric medications to her psychiatrist.   She complains of shaking feeling at 2:00 pm that has been present since the patient transferred here.   States new symptoms are constant freezing. She describes as feeling as if she has to tell her legs to move and then they eventually do. Once she takes the first couple steps she is fine.  She states this happens every three hours every day. She is concerned about safety.   I advised patient if happening on a pretty scheduled basis than she should be extra careful around those times. Use a walker at all times. She does have someone she has hired at night to help her out of bed.  She thinks this new symptom is related to the Requip that was changed to twice daily dosing at 01/22/2016 appointment. She is taking Requip 12 hours apart at 4 am and then 4 pm and insists that's how she was instructed to do it. She goes to bed at 8 pm.   I advised patient since she has appt on 02/11/17 (in a week and a half) that Dr. Arbutus Leas will need to exam her and talk to her in the office before changing medications since she has not reacted well to medication changes in the past. I again stressed safety with walker.  Dr. Arbutus Leas Lorain Childes.

## 2017-02-04 ENCOUNTER — Telehealth: Payer: Self-pay | Admitting: Neurology

## 2017-02-04 ENCOUNTER — Ambulatory Visit (INDEPENDENT_AMBULATORY_CARE_PROVIDER_SITE_OTHER): Payer: Medicare Other | Admitting: Licensed Clinical Social Worker

## 2017-02-04 DIAGNOSIS — F324 Major depressive disorder, single episode, in partial remission: Secondary | ICD-10-CM | POA: Diagnosis not present

## 2017-02-04 NOTE — Telephone Encounter (Signed)
Marcelino Duster called on behalf of Clyda. She requested a call back from Blair. Please call. Thanks

## 2017-02-04 NOTE — Telephone Encounter (Signed)
Confirmed appt with Marcelino Duster for next week. She will need her records transferred to new Neurologist after patient moves. She will sign form when she is here next week.

## 2017-02-10 ENCOUNTER — Telehealth: Payer: Self-pay | Admitting: Neurology

## 2017-02-10 NOTE — Telephone Encounter (Signed)
I don't believe I can send this without a HIPPA release. Teddi please advise.

## 2017-02-10 NOTE — Telephone Encounter (Signed)
Freddie with [facility] [place] called in regards to PT and said she is moving in there on Friday and they are asking for her current medication list CB# [phone]
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
## 2017-02-11 ENCOUNTER — Ambulatory Visit (INDEPENDENT_AMBULATORY_CARE_PROVIDER_SITE_OTHER): Payer: Medicare Other | Admitting: Neurology

## 2017-02-11 ENCOUNTER — Encounter: Payer: Self-pay | Admitting: Neurology

## 2017-02-11 VITALS — BP 136/60 | HR 80 | Ht 65.0 in | Wt 149.0 lb

## 2017-02-11 DIAGNOSIS — F411 Generalized anxiety disorder: Secondary | ICD-10-CM

## 2017-02-11 DIAGNOSIS — G20 Parkinson's disease: Secondary | ICD-10-CM

## 2017-02-11 DIAGNOSIS — G4752 REM sleep behavior disorder: Secondary | ICD-10-CM

## 2017-02-11 DIAGNOSIS — M25572 Pain in left ankle and joints of left foot: Secondary | ICD-10-CM

## 2017-02-11 MED ORDER — CARBIDOPA-LEVODOPA 25-100 MG PO TABS: 1.5000 | ORAL_TABLET | Freq: Every day | ORAL | 2 refills | Status: AC

## 2017-02-11 MED ORDER — ROPINIROLE HCL ER 6 MG PO TB24: 1.0000 | ORAL_TABLET | Freq: Two times a day (BID) | ORAL | 2 refills | Status: AC

## 2017-02-11 NOTE — Telephone Encounter (Signed)
Yes, you need a release from the patient.

## 2017-02-11 NOTE — Telephone Encounter (Signed)
Patient coming in for appt today and can sign release at that time.

## 2017-02-11 NOTE — Patient Instructions (Signed)
Good luck with your move!

## 2017-02-13 ENCOUNTER — Telehealth: Payer: Self-pay | Admitting: Neurology

## 2017-02-13 NOTE — Telephone Encounter (Signed)
Spoke with patient's daughter. Confirmed patient doesn't need referral to new neurologist in [place]. Patient just needs records sent. Record release signed at appt on Tuesday and forwarded to Medical Records department for them to send records. Patient's daughter given number to medical records in case they do no get records sent.

## 2017-02-13 NOTE — Telephone Encounter (Signed)
Patient Daughter in law called and wants to talk to someone about a referral to a physician in [place] as she is moving. She states that a DPR was filled out  and she was added  So we could talk with her. She needs to make sure if there is anything they need to do to help with this process it is done before they leave tomorrow morning please call

## 2018-09-03 ENCOUNTER — Inpatient Hospital Stay
Admit: 2018-09-03 | Discharge: 2018-09-03 | Disposition: A | Discharge status: Home / Self Care | Payer: BLUE CROSS/BLUE SHIELD | Attending: Emergency Medicine

## 2018-09-03 DIAGNOSIS — G20 Parkinson's disease: Secondary | ICD-10-CM

## 2018-09-03 LAB — CBC WITH AUTOMATED DIFF
ABS. BASOPHILS: 0 10*3/uL (ref 0.0–0.1)
ABS. EOSINOPHILS: 0.6 10*3/uL — ABNORMAL HIGH (ref 0.0–0.4)
ABS. LYMPHOCYTES: 1.8 10*3/uL (ref 0.9–3.6)
ABS. MONOCYTES: 0.7 10*3/uL (ref 0.05–1.2)
ABS. NEUTROPHILS: 8 10*3/uL (ref 1.8–8.0)
BASOPHILS: 0 % (ref 0–2)
EOSINOPHILS: 5 % (ref 0–5)
HCT: 38.4 % (ref 35.0–45.0)
HGB: 12.5 g/dL (ref 12.0–16.0)
LYMPHOCYTES: 17 % — ABNORMAL LOW (ref 21–52)
MCH: 30.5 PG (ref 24.0–34.0)
MCHC: 32.6 g/dL (ref 31.0–37.0)
MCV: 93.7 FL (ref 74.0–97.0)
MONOCYTES: 7 % (ref 3–10)
MPV: 9.2 FL (ref 9.2–11.8)
NEUTROPHILS: 71 % (ref 40–73)
PLATELET: 252 10*3/uL (ref 135–420)
RBC: 4.1 M/uL — ABNORMAL LOW (ref 4.20–5.30)
RDW: 13.1 % (ref 11.6–14.5)
WBC: 11.1 10*3/uL (ref 4.6–13.2)

## 2018-09-03 LAB — METABOLIC PANEL, BASIC
Anion gap: 4 mmol/L (ref 3.0–18)
BUN/Creatinine ratio: 21 — ABNORMAL HIGH (ref 12–20)
BUN: 17 MG/DL (ref 7.0–18)
CO2: 30 mmol/L (ref 21–32)
Calcium: 9.5 MG/DL (ref 8.5–10.1)
Chloride: 106 mmol/L (ref 100–111)
Creatinine: 0.81 MG/DL (ref 0.6–1.3)
GFR est AA: >60 mL/min/{1.73_m2} (ref >60)
GFR est non-AA: >60 mL/min/{1.73_m2} (ref >60)
Glucose: 102 mg/dL — ABNORMAL HIGH (ref 74–99)
Potassium: 3.8 mmol/L (ref 3.5–5.5)
Sodium: 140 mmol/L (ref 136–145)

## 2018-09-03 LAB — CK
CK: 119 U/L (ref 26–192)
Total CK: 119 U/L (ref 26–192)

## 2018-09-03 LAB — MAGNESIUM
Magnesium: 2.3 mg/dL (ref 1.6–2.6)
Magnesium: 2.3 mg/dL (ref 1.6–2.6)

## 2018-09-03 LAB — CBC WITH AUTO DIFFERENTIAL
Basophils %: 0 % (ref 0–2)
Basophils Absolute: 0 10*3/uL (ref 0.0–0.1)
Eosinophils %: 5 % (ref 0–5)
Eosinophils Absolute: 0.6 10*3/uL — ABNORMAL HIGH (ref 0.0–0.4)
Hematocrit: 38.4 % (ref 35.0–45.0)
Hemoglobin: 12.5 g/dL (ref 12.0–16.0)
Lymphocytes %: 17 % — ABNORMAL LOW (ref 21–52)
Lymphocytes Absolute: 1.8 10*3/uL (ref 0.9–3.6)
MCH: 30.5 PG (ref 24.0–34.0)
MCHC: 32.6 g/dL (ref 31.0–37.0)
MCV: 93.7 FL (ref 74.0–97.0)
MPV: 9.2 FL (ref 9.2–11.8)
Monocytes %: 7 % (ref 3–10)
Monocytes Absolute: 0.7 10*3/uL (ref 0.05–1.2)
Neutrophils %: 71 % (ref 40–73)
Neutrophils Absolute: 8 10*3/uL (ref 1.8–8.0)
Platelets: 252 10*3/uL (ref 135–420)
RBC: 4.1 M/uL — ABNORMAL LOW (ref 4.20–5.30)
RDW: 13.1 % (ref 11.6–14.5)
WBC: 11.1 10*3/uL (ref 4.6–13.2)

## 2018-09-03 LAB — BASIC METABOLIC PANEL
Anion Gap: 4 mmol/L (ref 3.0–18)
BUN: 17 MG/DL (ref 7.0–18)
Bun/Cre Ratio: 21 — ABNORMAL HIGH (ref 12–20)
CO2: 30 mmol/L (ref 21–32)
Calcium: 9.5 MG/DL (ref 8.5–10.1)
Chloride: 106 mmol/L (ref 100–111)
Creatinine: 0.81 MG/DL (ref 0.6–1.3)
EGFR IF NonAfrican American: >60 mL/min/{1.73_m2} (ref >60)
GFR African American: >60 mL/min/{1.73_m2} (ref >60)
Glucose: 102 mg/dL — ABNORMAL HIGH (ref 74–99)
Potassium: 3.8 mmol/L (ref 3.5–5.5)
Sodium: 140 mmol/L (ref 136–145)

## 2018-09-03 NOTE — ED Provider Notes (Signed)
EMERGENCY DEPARTMENT HISTORY AND PHYSICAL EXAM    Date: 09/03/2018  Patient Name: Lindsay [name]    History of Presenting Illness     Chief Complaint   Patient presents with   ??? Altered mental status         History Provided By: EMS    Additional History (Context):     3:19 AM    Lindsay [name] is a 68 y.o. female with pertinent PMHx of Parkinson's disease presenting to the ED via EMS due to AMS x tonight. Pt lives at [facility] and her caretaker noticed that she was very stiff around 2:00 AM. She was given a dose of new medication (Apokyn) for her Parkinson's around 2:15 AM but her mental status deteriorated. Her caretakers noted that she had an episode of clear emesis with mucous earlier in the night. They also report that the pt is normally A&Ox3. Per EMS, on their arrival pt was not responding to commands and had a very stiff neck and jaw but would track them with her eyes. Her BGL was 125.    PCP: Other, Phys, MD    There are no other complaints, changes, or physical findings at this time.         Past History     Past Medical History:  Past Medical History:   Diagnosis Date   ??? A-fib (HCC)    ??? Anxiety    ??? Depression    ??? Gait disorder    ??? Kyphosis    ??? Obstructive sleep apnea    ??? Parkinson's disease (HCC)    ??? Protein malnutrition (HCC)    ??? REM behavioral disorder    ??? Sialorrhea    ??? Spinal stenosis    ??? Tinnitus    ??? Tremors of nervous system    ??? Urinary incontinence        Past Surgical History:  No past surgical history on file.    Family History:  No family history on file.    Social History:  Social History     Tobacco Use   ??? Smoking status: Not on file   Substance Use Topics   ??? Alcohol use: Not on file   ??? Drug use: Not on file       Allergies:  No Known Allergies      Review of Systems   Review of Systems   Unable to perform ROS: Mental status change       Physical Exam     Vitals:    09/03/18 0323 09/03/18 0325 09/03/18 0400 09/03/18 0430   BP: 132/71  133/68 140/79   Pulse: 77  69 69    Resp: 17  22 23    Temp: 97.4 ??F (36.3 ??C)      SpO2: 100%  100% 100%   Weight:  64.7 kg (142 lb 11.2 oz)       Physical Exam  Vitals signs and nursing note reviewed.   Constitutional:       Appearance: She is well-developed. She is not diaphoretic.      Comments: Near catatonic state. Non verbal. Mouth is held open.   HENT:      Head: Normocephalic and atraumatic.      Right Ear: External ear normal.      Left Ear: External ear normal.      Mouth/Throat:      Pharynx: No oropharyngeal exudate.   Eyes:      General: No scleral icterus.  Conjunctiva/sclera: Conjunctivae normal.      Pupils: Pupils are equal, round, and reactive to light.   Neck:      Musculoskeletal: Neck rigidity present.      Thyroid: No thyromegaly.      Vascular: No JVD.      Trachea: No tracheal deviation.   Cardiovascular:      Rate and Rhythm: Normal rate and regular rhythm.      Heart sounds: Normal heart sounds.   Pulmonary:      Effort: Pulmonary effort is normal. No respiratory distress.      Breath sounds: Normal breath sounds. No stridor.   Abdominal:      General: Bowel sounds are normal. There is no distension.      Palpations: Abdomen is soft.      Tenderness: There is no abdominal tenderness. There is no guarding or rebound.   Musculoskeletal:         General: No tenderness.      Comments: Markedly slower muscle activity.   Lymphadenopathy:      Cervical: No cervical adenopathy.   Skin:     General: Skin is warm and dry.      Findings: No erythema or rash.   Neurological:      Cranial Nerves: No cranial nerve deficit.      Deep Tendon Reflexes: Reflexes are normal and symmetric.      Comments: Near catatonic state. Nonverbal.    Psychiatric:         Thought Content: Thought content normal.         Cognition and Memory: Cognition is impaired.         Judgment: Judgment normal.         Diagnostic Study Results     Labs -     Recent Results (from the past 12 hour(s))   CBC WITH AUTOMATED DIFF    Collection Time: 09/03/18  3:28 AM    Result Value Ref Range    WBC 11.1 4.6 - 13.2 K/uL    RBC 4.10 (L) 4.20 - 5.30 M/uL    HGB 12.5 12.0 - 16.0 g/dL    HCT 47.4 25.9 - 56.3 %    MCV 93.7 74.0 - 97.0 FL    MCH 30.5 24.0 - 34.0 PG    MCHC 32.6 31.0 - 37.0 g/dL    RDW 87.5 64.3 - 32.9 %    PLATELET 252 135 - 420 K/uL    MPV 9.2 9.2 - 11.8 FL    NEUTROPHILS 71 40 - 73 %    LYMPHOCYTES 17 (L) 21 - 52 %    MONOCYTES 7 3 - 10 %    EOSINOPHILS 5 0 - 5 %    BASOPHILS 0 0 - 2 %    ABS. NEUTROPHILS 8.0 1.8 - 8.0 K/UL    ABS. LYMPHOCYTES 1.8 0.9 - 3.6 K/UL    ABS. MONOCYTES 0.7 0.05 - 1.2 K/UL    ABS. EOSINOPHILS 0.6 (H) 0.0 - 0.4 K/UL    ABS. BASOPHILS 0.0 0.0 - 0.1 K/UL    DF AUTOMATED     METABOLIC PANEL, BASIC    Collection Time: 09/03/18  3:28 AM   Result Value Ref Range    Sodium 140 136 - 145 mmol/L    Potassium 3.8 3.5 - 5.5 mmol/L    Chloride 106 100 - 111 mmol/L    CO2 30 21 - 32 mmol/L    Anion gap 4 3.0 - 18 mmol/L  Glucose 102 (H) 74 - 99 mg/dL    BUN 17 7.0 - 18 MG/DL    Creatinine 4.54 0.6 - 1.3 MG/DL    BUN/Creatinine ratio 21 (H) 12 - 20      GFR est AA >60 >60 ml/min/1.17m2    GFR est non-AA >60 >60 ml/min/1.38m2    Calcium 9.5 8.5 - 10.1 MG/DL   MAGNESIUM    Collection Time: 09/03/18  3:28 AM   Result Value Ref Range    Magnesium 2.3 1.6 - 2.6 mg/dL   CK    Collection Time: 09/03/18  3:28 AM   Result Value Ref Range    CK 119 26 - 192 U/L       Radiologic Studies -   No orders to display     CT Results  (Last 48 hours)    None        CXR Results  (Last 48 hours)    None            Medical Decision Making   I am the first provider for this patient.    I reviewed the vital signs, available nursing notes, past medical history, past surgical history, family history and social history.    Vital Signs-Reviewed the patient's vital signs.    Pulse Oximetry Analysis - 100% on RA     Cardiac Monitor:  Rate: 70 bpm  Rhythm: NSR    Records Reviewed: Nursing Notes and Old Medical Records     Provider Notes (Medical Decision Making): Looks to be medication side effect. Spoke with neurologist, no interventions required. Will let medications wear off.    PROCEDURES:  Procedures    MEDICATIONS GIVEN IN THE ED:  Medications - No data to display     ED COURSE:   3:19 AM   Initial assessment performed.    CONSULT NOTE:   4:04 AM  Duane Lope. Edilia Bo, MD spoke with Duane Boston, MD   Specialty: Neurology  Discussed pt's hx, disposition, and available diagnostic and imaging results  over the telephone. Reviewed care plans. Consulting physician agrees with plans as outlined.       Diagnosis and Disposition       DISCHARGE NOTE:  4:56 AM  The patient is ready for discharge. The patient's signs, symptoms, diagnosis, and discharge instructions have been discussed and the patient and/or family has conveyed their understanding. The patient and/or family is to follow up as recommended or return to the ER should their symptoms worsen. Plan has been discussed and the patient and/or family is in agreement.   Written by Fredderick Severance, ED Scribe, as dictated by Duane Lope Edilia Bo, MD.     CLINICAL IMPRESSION:  1. Catatonia    2. Parkinson's disease (HCC)        PLAN:  1. D/C Home  2.   Current Discharge Medication List        3.   Follow-up Information    None       _______________________________    Attestations:  This note is prepared by Fredderick Severance, acting as Scribe for [employer]. Edilia Bo, MD.    Duane Lope. Edilia Bo, MD:  The scribe's documentation has been prepared under my direction and personally reviewed by me in its entirety. I confirm that the note above accurately reflects all work, treatment, procedures, and medical decision making performed by me.  _______________________________

## 2018-09-03 NOTE — ED Triage Notes (Signed)
Per updated report of staff pt began to experience increased stiffness at 2 am this morning. She was responsive, but unable to follow commands at hat time. She was given Apoxyn at that time PRN to help her muscles relax. However, the med was not effective as it usually is and pt remained stiff, unable to speak and there for was transferred to the ED for evaluation. Pt did have an episode of clear mucoid emesis last night at [facility].

## 2018-09-03 NOTE — ED Notes (Addendum)
BIBA from facility. Pt with hx of parkinson's disease. Received a new medication tonight.Now stiff and with limited movement on her own or speak. Pt does track with her eyes.  Unable to complete full screens as pt is unable to answer.

## 2018-09-03 NOTE — ED Notes (Signed)
Pt was transferred back to home facility [facility]. She is stable, moving on her own and conversing and in NAD. Transported via stretcher with [facility] transport with medics at her side. She is in NAD and [facility] is expecting her arrival .

## 2018-09-03 NOTE — ED Notes (Signed)
Warm blankets given for comfort. Pt opens eyes with speech, able to speak with RN. Voice is quiet, but coherent. Pt is OX4, answers that she is feeling better.

## 2018-09-03 NOTE — ED Provider Notes (Signed)
EMERGENCY DEPARTMENT HISTORY AND PHYSICAL EXAM    Date: 09/03/2018  Patient Name: Lindsay [name]    History of Presenting Illness     Chief Complaint   Patient presents with   ??? Altered mental status         History Provided By: EMS    Additional History (Context):     3:19 AM    Lindsay [name] is a 68 y.o. female with pertinent PMHx of Parkinson's disease presenting to the ED via EMS due to AMS x tonight. Pt lives at [facility] and her caretaker noticed that she was very stiff around 2:00 AM. She was given a dose of new medication (Apokyn) for her Parkinson's around 2:15 AM but her mental status deteriorated. Her caretakers noted that she had an episode of clear emesis with mucous earlier in the night. They also report that the pt is normally A&Ox3. Per EMS, on their arrival pt was not responding to commands and had a very stiff neck and jaw but would track them with her eyes. Her BGL was 125.    PCP: Other, Phys, MD    There are no other complaints, changes, or physical findings at this time.         Past History     Past Medical History:  Past Medical History:   Diagnosis Date   ??? A-fib (HCC)    ??? Anxiety    ??? Depression    ??? Gait disorder    ??? Kyphosis    ??? Obstructive sleep apnea    ??? Parkinson's disease (HCC)    ??? Protein malnutrition (HCC)    ??? REM behavioral disorder    ??? Sialorrhea    ??? Spinal stenosis    ??? Tinnitus    ??? Tremors of nervous system    ??? Urinary incontinence        Past Surgical History:  No past surgical history on file.    Family History:  No family history on file.    Social History:  Social History     Tobacco Use   ??? Smoking status: Not on file   Substance Use Topics   ??? Alcohol use: Not on file   ??? Drug use: Not on file       Allergies:  No Known Allergies      Review of Systems   Review of Systems   Unable to perform ROS: Mental status change       Physical Exam     Vitals:    09/03/18 0323 09/03/18 0325 09/03/18 0400 09/03/18 0430   BP: 132/71  133/68 140/79   Pulse: 77  69 69    Resp: 17  22 23    Temp: 97.4 ??F (36.3 ??C)      SpO2: 100%  100% 100%   Weight:  64.7 kg (142 lb 11.2 oz)       Physical Exam  Vitals signs and nursing note reviewed.   Constitutional:       Appearance: She is well-developed. She is not diaphoretic.      Comments: Near catatonic state. Non verbal. Mouth is held open.   HENT:      Head: Normocephalic and atraumatic.      Right Ear: External ear normal.      Left Ear: External ear normal.      Mouth/Throat:      Pharynx: No oropharyngeal exudate.   Eyes:      General: No scleral icterus.  Conjunctiva/sclera: Conjunctivae normal.      Pupils: Pupils are equal, round, and reactive to light.   Neck:      Musculoskeletal: Neck rigidity present.      Thyroid: No thyromegaly.      Vascular: No JVD.      Trachea: No tracheal deviation.   Cardiovascular:      Rate and Rhythm: Normal rate and regular rhythm.      Heart sounds: Normal heart sounds.   Pulmonary:      Effort: Pulmonary effort is normal. No respiratory distress.      Breath sounds: Normal breath sounds. No stridor.   Abdominal:      General: Bowel sounds are normal. There is no distension.      Palpations: Abdomen is soft.      Tenderness: There is no abdominal tenderness. There is no guarding or rebound.   Musculoskeletal:         General: No tenderness.      Comments: Markedly slower muscle activity.   Lymphadenopathy:      Cervical: No cervical adenopathy.   Skin:     General: Skin is warm and dry.      Findings: No erythema or rash.   Neurological:      Cranial Nerves: No cranial nerve deficit.      Deep Tendon Reflexes: Reflexes are normal and symmetric.      Comments: Near catatonic state. Nonverbal.    Psychiatric:         Thought Content: Thought content normal.         Cognition and Memory: Cognition is impaired.         Judgment: Judgment normal.         Diagnostic Study Results     Labs -     Recent Results (from the past 12 hour(s))   CBC WITH AUTOMATED DIFF    Collection Time: 09/03/18  3:28 AM    Result Value Ref Range    WBC 11.1 4.6 - 13.2 K/uL    RBC 4.10 (L) 4.20 - 5.30 M/uL    HGB 12.5 12.0 - 16.0 g/dL    HCT 47.4 25.9 - 56.3 %    MCV 93.7 74.0 - 97.0 FL    MCH 30.5 24.0 - 34.0 PG    MCHC 32.6 31.0 - 37.0 g/dL    RDW 87.5 64.3 - 32.9 %    PLATELET 252 135 - 420 K/uL    MPV 9.2 9.2 - 11.8 FL    NEUTROPHILS 71 40 - 73 %    LYMPHOCYTES 17 (L) 21 - 52 %    MONOCYTES 7 3 - 10 %    EOSINOPHILS 5 0 - 5 %    BASOPHILS 0 0 - 2 %    ABS. NEUTROPHILS 8.0 1.8 - 8.0 K/UL    ABS. LYMPHOCYTES 1.8 0.9 - 3.6 K/UL    ABS. MONOCYTES 0.7 0.05 - 1.2 K/UL    ABS. EOSINOPHILS 0.6 (H) 0.0 - 0.4 K/UL    ABS. BASOPHILS 0.0 0.0 - 0.1 K/UL    DF AUTOMATED     METABOLIC PANEL, BASIC    Collection Time: 09/03/18  3:28 AM   Result Value Ref Range    Sodium 140 136 - 145 mmol/L    Potassium 3.8 3.5 - 5.5 mmol/L    Chloride 106 100 - 111 mmol/L    CO2 30 21 - 32 mmol/L    Anion gap 4 3.0 - 18 mmol/L  Glucose 102 (H) 74 - 99 mg/dL    BUN 17 7.0 - 18 MG/DL    Creatinine 2.92 0.6 - 1.3 MG/DL    BUN/Creatinine ratio 21 (H) 12 - 20      GFR est AA >60 >60 ml/min/1.4m2    GFR est non-AA >60 >60 ml/min/1.4m2    Calcium 9.5 8.5 - 10.1 MG/DL   MAGNESIUM    Collection Time: 09/03/18  3:28 AM   Result Value Ref Range    Magnesium 2.3 1.6 - 2.6 mg/dL   CK    Collection Time: 09/03/18  3:28 AM   Result Value Ref Range    CK 119 26 - 192 U/L       Radiologic Studies -   No orders to display     CT Results  (Last 48 hours)    None        CXR Results  (Last 48 hours)    None            Medical Decision Making   I am the first provider for this patient.    I reviewed the vital signs, available nursing notes, past medical history, past surgical history, family history and social history.    Vital Signs-Reviewed the patient's vital signs.    Pulse Oximetry Analysis - 100% on RA     Cardiac Monitor:  Rate: 70 bpm  Rhythm: NSR    Records Reviewed: Nursing Notes and Old Medical Records    Provider Notes (Medical Decision Making): Looks to be  medication side effect. Spoke with neurologist, no interventions required. Will let medications wear off.    PROCEDURES:  Procedures    MEDICATIONS GIVEN IN THE ED:  Medications - No data to display     ED COURSE:   3:19 AM   Initial assessment performed.    CONSULT NOTE:   4:04 AM  Duane Lope. Edilia Bo, MD spoke with Duane Boston, MD   Specialty: Neurology  Discussed pt's hx, disposition, and available diagnostic and imaging results  over the telephone. Reviewed care plans. Consulting physician agrees with plans as outlined.       Diagnosis and Disposition       DISCHARGE NOTE:  4:56 AM  The patient is ready for discharge. The patient's signs, symptoms, diagnosis, and discharge instructions have been discussed and the patient and/or family has conveyed their understanding. The patient and/or family is to follow up as recommended or return to the ER should their symptoms worsen. Plan has been discussed and the patient and/or family is in agreement.   Written by Fredderick Severance, ED Scribe, as dictated by Duane Lope Edilia Bo, MD.     CLINICAL IMPRESSION:  1. Catatonia    2. Parkinson's disease (HCC)        PLAN:  1. D/C Home  2.   Current Discharge Medication List        3.   Follow-up Information    None       _______________________________    Attestations:  This note is prepared by Fredderick Severance, acting as Scribe for [employer]. Edilia Bo, MD.    Duane Lope. Edilia Bo, MD:  The scribe's documentation has been prepared under my direction and personally reviewed by me in its entirety. I confirm that the note above accurately reflects all work, treatment, procedures, and medical decision making performed by me.  _______________________________

## 2018-09-03 NOTE — ED Notes (Signed)
Per updated report of staff pt began to experience increased stiffness at 2 am this morning. She was responsive, but unable to follow commands at hat time. She was given Apoxyn at that time PRN to help her muscles relax. However, the med was not effective as it usually is and pt remained stiff, unable to speak and there for was transferred to the ED for evaluation. Pt did have an episode of clear mucoid emesis last night at [facility].

## 2018-09-03 NOTE — ED Notes (Signed)
Pt was transferred back to home facility [facility]. She is stable, moving on her own and conversing and in NAD. Transported via stretcher with [facility] transport with medics at her side. She is in NAD and [facility] is expecting her arrival .

## 2018-09-03 NOTE — ED Notes (Signed)
Warm blankets given for comfort. Pt opens eyes with speech, able to speak with RN. Voice is quiet, but coherent. Pt is OX4, answers that she is feeling better.

## 2018-09-03 NOTE — ED Notes (Signed)
BIBA from facility. Pt with hx of parkinson's disease. Received a new medication tonight.Now stiff and with limited movement on her own or speak. Pt does track with her eyes.  Unable to complete full screens as pt is unable to answer.
